# Patient Record
Sex: Female | Born: 1965 | Race: White | Hispanic: No | Marital: Married | Smoking: Never smoker
Health system: Southern US, Community
[De-identification: ages and names within clinical notes are randomized; demographics above are authoritative.]

## PROBLEM LIST (undated history)

## (undated) DIAGNOSIS — T7840XA Allergy, unspecified, initial encounter: Secondary | ICD-10-CM

## (undated) DIAGNOSIS — R011 Cardiac murmur, unspecified: Secondary | ICD-10-CM

## (undated) DIAGNOSIS — I10 Essential (primary) hypertension: Secondary | ICD-10-CM

## (undated) DIAGNOSIS — B029 Zoster without complications: Secondary | ICD-10-CM

## (undated) DIAGNOSIS — R21 Rash and other nonspecific skin eruption: Secondary | ICD-10-CM

## (undated) DIAGNOSIS — F329 Major depressive disorder, single episode, unspecified: Secondary | ICD-10-CM

## (undated) DIAGNOSIS — D649 Anemia, unspecified: Secondary | ICD-10-CM

## (undated) DIAGNOSIS — M722 Plantar fascial fibromatosis: Secondary | ICD-10-CM

## (undated) DIAGNOSIS — F32A Depression, unspecified: Secondary | ICD-10-CM

## (undated) DIAGNOSIS — Z8619 Personal history of other infectious and parasitic diseases: Secondary | ICD-10-CM

## (undated) HISTORY — DX: Essential (primary) hypertension: I10

## (undated) HISTORY — DX: Zoster without complications: B02.9

## (undated) HISTORY — DX: Personal history of other infectious and parasitic diseases: Z86.19

## (undated) HISTORY — DX: Rash and other nonspecific skin eruption: R21

## (undated) HISTORY — DX: Cardiac murmur, unspecified: R01.1

## (undated) HISTORY — DX: Plantar fascial fibromatosis: M72.2

## (undated) HISTORY — DX: Major depressive disorder, single episode, unspecified: F32.9

## (undated) HISTORY — PX: CERVIX REMOVAL: SHX592

## (undated) HISTORY — PX: KNEE SURGERY: SHX244

## (undated) HISTORY — DX: Anemia, unspecified: D64.9

## (undated) HISTORY — DX: Depression, unspecified: F32.A

## (undated) HISTORY — DX: Allergy, unspecified, initial encounter: T78.40XA

## (undated) HISTORY — PX: FOOT SURGERY: SHX648

---

## 2003-05-21 HISTORY — PX: ABDOMINAL HYSTERECTOMY: SHX81

## 2003-08-19 ENCOUNTER — Encounter: Admission: RE | Admit: 2003-08-19 | Discharge: 2003-08-19 | Payer: Self-pay | Admitting: Unknown Physician Specialty

## 2005-07-17 ENCOUNTER — Encounter (INDEPENDENT_AMBULATORY_CARE_PROVIDER_SITE_OTHER): Payer: Self-pay | Admitting: Specialist

## 2005-07-17 ENCOUNTER — Ambulatory Visit (HOSPITAL_COMMUNITY): Admission: RE | Admit: 2005-07-17 | Discharge: 2005-07-17 | Payer: Self-pay | Admitting: Obstetrics and Gynecology

## 2005-10-09 ENCOUNTER — Encounter (INDEPENDENT_AMBULATORY_CARE_PROVIDER_SITE_OTHER): Payer: Self-pay | Admitting: *Deleted

## 2005-10-09 ENCOUNTER — Inpatient Hospital Stay (HOSPITAL_COMMUNITY): Admission: RE | Admit: 2005-10-09 | Discharge: 2005-10-10 | Payer: Self-pay | Admitting: Obstetrics and Gynecology

## 2007-12-25 ENCOUNTER — Ambulatory Visit: Payer: Self-pay | Admitting: Internal Medicine

## 2007-12-29 ENCOUNTER — Encounter: Payer: Self-pay | Admitting: Internal Medicine

## 2007-12-29 ENCOUNTER — Ambulatory Visit: Payer: Self-pay | Admitting: Cardiovascular Disease

## 2007-12-29 LAB — CONVERTED CEMR LAB
ALT: 30 units/L (ref 0–35)
AST: 28 units/L (ref 0–37)
Albumin: 3.6 g/dL (ref 3.5–5.2)
CO2: 21 meq/L (ref 19–32)
Cholesterol: 159 mg/dL (ref 0–200)
Creatinine, Ser: 0.55 mg/dL (ref 0.40–1.20)
LDL Cholesterol: 104 mg/dL — ABNORMAL HIGH (ref 0–99)
Sodium: 142 meq/L (ref 135–145)
Triglycerides: 95 mg/dL (ref <150)

## 2008-01-13 ENCOUNTER — Encounter: Payer: Self-pay | Admitting: Internal Medicine

## 2008-01-13 ENCOUNTER — Ambulatory Visit: Payer: Self-pay

## 2010-10-02 NOTE — Assessment & Plan Note (Signed)
[facility] OFFICE NOTE   JAIMEY, FRANCHINI                      MRN:          [id number]  DATE:12/25/2007                            DOB:          [birth year]    PRIMARY CARE PHYSICIAN:  Vonita Moss, MD   Smt. is a delightful 45 year old woman, who is the wife of Honest Safranek, a friend of mine.  She presents today for further evaluation of  a murmur.   She had a history of severe rheumatic fever as a child and was told as a  child that she had a murmur.  She has never had any other heart  problems.  About 2 years ago, she had a hysterectomy, and the  anesthesiologist told her she had a murmur, so she wanted to get this  checked up upon.   Overall, she is fairly active.  She walks couple times a week, but not  as much as she likes, but walks very fast without any chest pain.  She  does notice about twice a year with emotional stress, she gets some  chest tightness.  She recently hiked the [employer] with a 50-  pound pack, and aside from feeling tired, did not have any symptoms.  She does have chronic lower extremity edema due to her severe varicose  veins.   Remainder of review of systems is notable for constipation and fatigue  as well as menstrual dysfunction.  Rest of review of systems are  negative except for the HPI and problem list.   PROBLEM LIST:  1. History of heart murmur.  2. Varicose veins with associated lower extremity edema.   MEDICATIONS:  None.   ALLERGIES:  None.   SOCIAL HISTORY:  She is married with 2 kids.  She is a Runner, broadcasting/film/video.  She  does not smoke.  She does have occasional alcoholic drink.   FAMILY HISTORY:  Family is very healthy, 3 of her 4 grandparents are  still alive and well into their 28s.  There is no family history of  premature coronary artery disease.   PHYSICAL EXAMINATION:  GENERAL:  She is well appearing, in no acute  distress, ambulatory around the  clinic, without any respiratory  difficulty.  VITAL SIGNS:  Blood pressure initially 118/80, followup 134/90; heart  rate 87, and weight is 200.  HEENT:  Normal.  Her tympanic membranes are clear.  Her oropharynx is  also clear.  NECK:  Supple.  There is no JVD.  Carotids are 2+ bilaterally without  bruits.  She does have a little tender lymph node on the left side under  the ear.  There is no thyromegaly.  CARDIAC:  PMI is nondisplaced.  She is regular with a very soft systolic  ejection murmur at the right sternal border.  This does not change  significantly with inspiration or Valsalva.  S2 is crisp.  LUNGS:  Clear.  ABDOMEN:  Obese, nontender, and nondistended.  No hepatosplenomegaly.  No bruits.  No masses.  ABDOMEN:  Good bowel sounds.  EXTREMITIES:  Warm with no cyanosis or clubbing.  She does have  significant varicose veins with some just trace ankle edema.  No rash.  NEURO:  Alert and oriented x3.  Cranial nerves II through XII are  intact.  Moves all 4 extremities without difficulty.  Affect is  pleasant.   EKG shows sinus rhythm at a rate 87 with no ST-T wave abnormalities.   ASSESSMENT AND PLAN:  1. Murmur.  I think this is benign functional murmur.  We will get an      echocardiogram to further evaluate.  I have reassured her.  2. Chest pain.  This is a very occasional.  She is very active without      any exertional symptoms and no family history of premature coronary      artery disease.  We did discuss the possibility of stress test.  I      told her I think we can just continue to follow.   Lipids.  We will check her cholesterol and CRP when she follows up for  echocardiogram and see where they are at.   DISPOSITION:  She can return on a p.r.n. basis.     Bevelyn Buckles. Bensimhon, MD  Electronically Signed    DRB/MedQ  DD: 12/25/2007  DT: 12/26/2007  Job #: [id number]

## 2010-10-05 NOTE — Op Note (Signed)
NAMEBRINLEY, ROSETE             ACCOUNT NO.:  [id number]   MEDICAL RECORD NO.:  [id number]          PATIENT TYPE:  INP   LOCATION:  9307                          [facility]ACILITY:  [facility]   PHYSICIAN:  Zelphia Cairo, MD    DATE OF BIRTH:  13-Oct-1965   DATE OF PROCEDURE:  10/09/2005  DATE OF DISCHARGE:                                 OPERATIVE REPORT   PREOPERATIVE DIAGNOSES:  1.  Endometriosis.  2.  Right lower quadrant pain.  3.  Cystocele.   POSTOPERATIVE DIAGNOSES:  1.  Endometriosis.  2.  Right lower quadrant pain.   PROCEDURE:  LAVH/RSO with anterior repair.   SURGEON:  Zelphia Cairo, MD.   ASSISTANT:  Juluis Mire, M.D.   ANESTHESIA:  General.   SPECIMEN:  Uterine cervix, right tube and ovary.   ESTIMATED BLOOD LOSS:  400 mL.   COMPLICATIONS:  None.   CONDITION:  Stable and extubated to the recovery room.   PROCEDURE:  The patient was taken to the operating room where general  anesthesia was obtained. She was placed in the dorsal lithotomy position  using Allen stirrups.  She was prepped and draped in sterile fashion and a  Foley catheter was inserted.  A speculum was placed in the vagina and a  single-toothed tenaculum was used to grasp the anterior lip of the cervix.  A Hulka uterine manipulator was placed on the cervix.  The single-tooth  tenaculum and the speculum were then removed.   Our attention was then turned to the abdomen where a infraumbilical skin  incision was made with the scalpel and the Veress needle was inserted  intraperitoneally.  This was confirmed with the saline drop test and abdomen  and pelvis were insufflated with CO2.  The Veress needle was then removed  and an blunt 11 mm trocar was inserted through the incision under direct  visualization with the straight scope.  Next a small suprapubic incision was  made with the scalpel and a 5 mm trocar was placed under direct  visualization.  A blunt probe was used to move any bowel out of the  posterior cul-de-sac.  The patient was placed in Trendelenburg.  A blunt  grasper was used to grasp the right adnexa which was tented upwards.  The  gyrus was used to coagulate and cut the infundibulopelvic ligament.  Excellent hemostasis was noted.  Serial bites with the gyrus were taken  along the broad ligament adjacent to the right fallopian tube and uterus to  the level of the round ligament.  Once hemostasis was ensured, our attention  was turned to the left side.  The left adnexa was tented upward and the left  utero-ovarian ligament was coagulated and cut with the gyrus.  This was  continued down the broad ligament adjacent to the uterus to the level of the  round ligament.  Hemostasis was ensured.  The gas and equipment were turned  off and our attention was then turned to the vagina.   The Hulka clamp was removed from the cervix and the cervix was grasped with  a double tooth tenaculum.  The posterior cul-de-sac was entered sharply  using Mayo scissors.  A circumferential incision was made around the cervix  using the Bovie.  The uterosacral ligaments were grasped with the Ligasure  and cauterized bilaterally and cut with Mayo scissors.  Next, the anterior  cul-de-sac was entered sharply using Mayo scissors.  The Deaver retractor  was placed in the anterior cul-de-sac. The uterine arteries and cardinal  ligaments were cauterized bilaterally using the Ligasure and cut with Mayo  scissors.  The uterine fundus was then grasped with a thyroid tenaculum and  delivered through the posterior cul-de-sac.  The remaining attachments of  the broad ligaments were clamped bilaterally with curved Heaney clamps.  The  pedicles were cut with Mayo scissors and the specimen was handed off to the  nursing staff.  A free tie was used to tie off the remaining pedicles.  Heaney clamps were then removed.  A moist lap sponge was then placed into  the posterior cul-de-sac to retract the bowel.  The  posterior cuff was  closed with a running locked 2-0 Vicryl stitch.   Next, two Allis clamps were placed at the edge of the vaginal cuff and  another Allis clamp was placed approximately 1 cm below the urethral meatus.  Metzenbaum scissors were used to dissect the vaginal mucosa from the  underlying cervicopubic fascia.  This was dissected bluntly and using the  Metzenbaum scissors.  Next the cervical pubic fascia was reapproximated  using 2-0 Vicryl figure-of-eight stitches.  Excess vaginal mucosa was  trimmed using Metzenbaum scissors and the vaginal mucosa was reapproximated  using #0 Vicryl figure-of-eight sutures in an interrupted fashion.  The  vaginal cuff was then closed with #0 Vicryl and hemostasis was noted.   Our attention returned to the abdomen where the abdomen and pelvis were  reinsufflated with CO2.  The camera was used to ensure hemostasis.  All  pedicles were visualized and found to be hemostatic.  There were no residual  endometriosis noted.  All instruments were then removed from the abdomen and  the suprapubic incision was closed with 3-0 Vicryl with a subcutaneous  stitch.  The fascia of the infraumbilical incision was closed with #0 Vicryl  and the skin was closed with 3-0 Vicryl.  The patient tolerated the  procedure well.  Sponge, lap, needle and instrument counts were correct x2.  She was extubated and taken to the recovery room in stable condition.      Zelphia Cairo, MD  Electronically Signed     GA/MEDQ  D:  10/09/2005  T:  10/09/2005  Job:  [id number]

## 2010-10-05 NOTE — H&P (Signed)
[name], [name]             ACCOUNT NO.:  [id number]   MEDICAL RECORD NO.:  [id number]          PATIENT TYPE:  AMB   LOCATION:  SDC                           [facility]ILITY:  [facility]   PHYSICIAN:  Zelphia Cairo, MD    DATE OF BIRTH:  1966/02/17   DATE OF ADMISSION:  07/17/2005  DATE OF DISCHARGE:  07/03/2005                                HISTORY & PHYSICAL   A 45 year old white female, who presents complaining of menorrhagia.  She  reports that she has monthly irregular periods every 2-1/2 to 3-1/2 weeks  lasting 5-7 days.  They are very heavy associated with blood clots and she  has been on iron in the past to prevent severe anemia.  She also complains  of fatigue.  The patient also reports groin pain over the last year and had  an ultrasound done December of 2006, which showed a small right ovarian cyst  with fluid in the cul-de-sac.  The patient also complains of painful bowel  movements.   PAST MEDICAL HISTORY:  Negative.   PAST SURGICAL HISTORY:  Negative.   ALLERGIES:  NONE.   MEDICATIONS:  Iron, baby aspirin and multivitamin.   GYNECOLOGIC HISTORY:  As above.  Her last Pap smear was normal.   OBSTETRIC HISTORY:  Significant for 2 full-term spontaneously vaginal  deliveries without complications.   PHYSICAL EXAMINATION:  VITAL SIGNS:  Height 5 feet 8 inches, weight 210,  blood pressure 112/64 and hemoglobin 10.  HEART:  Regular rate and rhythm.  LUNGS:  Clear bilaterally.  ABDOMEN:  Soft and nontender.  PELVIC EXAM:  Normal external female genitalia.  The vagina and cervix  appear normal without lesion.  The uterus is small, retroverted and  nontender.  No palpable masses.   A transvaginal ultrasound was repeated today, which showed a resolution of  the right ovarian cyst.  The left ovary has small follicular cysts.  There  is a moderate amount of fluid in the cul-de-sac.  Endometrial stripe is  thickened at 17 mm.   ASSESSMENT AND PLAN:  A 44 year old white  female.  1.  Menorrhagia.  Given that she has a thickened endometrial stripe on      ultrasound, we will perform a hysteroscopy and D&C today.  2.  Pelvic pain.  The patient continues to want a diagnostic laparoscopy      given resolution of the right ovarian cyst.  We will evaluate for any      other potential sources of pain such as adhesive disease or      endometriosis.  Risks, benefits, and alternatives were explained with      the patient and informed consent was obtained.      Zelphia Cairo, MD  Electronically Signed     GA/MEDQ  D:  07/15/2005  T:  07/15/2005  Job:  [id number]

## 2010-10-05 NOTE — H&P (Signed)
[name], [name]             ACCOUNT NO.:  [id number]   MEDICAL RECORD NO.:  [id number]          PATIENT TYPE:  AMB   LOCATION:  SDC                           [facility]   PHYSICIAN:  Zelphia Cairo, MD    DATE OF BIRTH:  06/02/1965   DATE OF ADMISSION:  DATE OF DISCHARGE:                                HISTORY & PHYSICAL   A 45 year old white female, who presents today for an LAVH with RSO.  The  patient underwent a hysteroscopy D&C with pathology showing benign secretory  endometrium on July 17, 2005.  At that same time, she had a diagnostic  laparoscopy which showed evidence of endometriosis.  Due to the patient's  continued pain, the patient elects to proceed with a hysterectomy and RSO.   PAST MEDICAL HISTORY:  Negative.   PAST SURGICAL HISTORY:  1.  Arm skin lesion removed.  2.  Hysteroscopy D&C.  3.  Diagnostic laparoscopy.   ALLERGIES:  None.   MEDICATIONS:  Iron, baby aspirin, multiple vitamin.   GYN HISTORY:  Significant for a history of abnormal Pap smear 16 years ago,  however, they have been normal since then.  Her last Pap smear in December  2006 was normal.   OB HISTORY:  Significant for 2 full-term spontaneous vaginal deliveries  without complications.   Height 5 feet 8 inches, weight 210, blood pressure 112/64, hemoglobin 10,  urinalysis negative.  Head and neck exam are normal.  Heart is regular rate  and rhythm.  Lungs are clear bilaterally.  Abdomen is soft, nontender,  nondistended.  Pelvic exam reveals normal external female genitalia.  Vagina  appears normal without lesions.  There is evidence of a small cystocele  anteriorly.  Posterior support seems adequate.  Cervix is normal.  Uterus is  small, mobile, retroverted, and nontender.  No adnexal masses are palpated.   ASSESSMENT/PLAN:  A 45 year old white female with pelvic pain and  endometriosis scheduled for laparoscopically-assisted vaginal hysterectomy,  right  salpingo-oophorectomy.  Risks, benefits, and alternatives were  discussed with the patient, and informed consent was obtained.  We also  discussed an anterior repair for anterior vaginal prolapse.  The patient  elected to proceed.      Zelphia Cairo, MD  Electronically Signed    GA/MEDQ  D:  10/08/2005  T:  10/08/2005  Job:  [id number]

## 2010-10-05 NOTE — Op Note (Signed)
NAMEGIADA, [name]             ACCOUNT NO.:  [id number]   MEDICAL RECORD NO.:  [id number]          PATIENT TYPE:  AMB   LOCATION:  SDC                           [facility]   PHYSICIAN:  Zelphia Cairo, MD    DATE OF BIRTH:  04-10-1966   DATE OF PROCEDURE:  07/17/2005  DATE OF DISCHARGE:                                 OPERATIVE REPORT   PREOPERATIVE DIAGNOSES:  1.  Menorrhagia.  2.  Right lower quadrant pain.   POSTOPERATIVE DIAGNOSES:  1.  Menorrhagia.  2.  Right lower quadrant pain.  3.  Suspect endometriosis, pathology pending.   OPERATION/PROCEDURE:  1.  Hysterectomy and dilatation and curettage.  2.  Diagnostic laparoscopy with biopsy of peritoneum.   SURGEON:  Zelphia Cairo, M.D.   ANESTHESIA:  General.   SPECIMENS:  1.  Endometrial curettings.  2.  Peritoneal biopsy.   ESTIMATED BLOOD LOSS:  Minimal.   COMPLICATIONS:  None.   CONDITION:  The patient stable and extubated to the recovery room.   DESCRIPTION OF PROCEDURE:  The patient was taken to the operating room where  general anesthesia was obtained.  She was placed in the dorsal lithotomy  position using Allen stirrups.  She was prepped and draped in the sterile  fashion.  A Graves speculum was placed in the vagina and a single-tooth  tenaculum was placed on the anterior lip of the cervix.  The uterus sounded  to 10 cm.  The hysteroscope was inserted into the endometrial cavity.  The  endometrium was noted to be very fluffy with a few small polyps on the  anterior uterus.  Hysteroscope was removed and endometrial curetting was  performed.  This was placed on Telfa and sent off the pathology.  Marcaine  0.25% was used for a cervical block and a uterine manipulator was placed in  the uterus.  Single-tooth tenaculum and speculum were removed from the  vagina and our attention was then turned to the abdomen.  A small  infraumbilical skin incision was made with scalpel and the Veress needle was  inserted.  The abdomen and pelvis were then insufflated with CO2.  The  Veress needle was removed and an 11 mm trocar was inserted without  difficulty.  The laparoscope was then inserted through the 11 mm trocar and  a survey of the abdomen and pelvis was performed. A small suprapubic skin  incision was made with the scalpel and a 5 mm trocar was inserted under  direct visualization.  A blunt probe was placed through this trocar to aid  in our survey.  Liver and appendix all appeared within normal limits.  The  uterus appeared normal.  There were a small endometrioma-appearing cyst on  the right ovary.  There were multiple cystic areas with powder burns along  the right uterosacral ligament, the right cardinal ligament and the left  ovarian fossa.  There was a moderate amount of fluid in the cul-de-sac.  Fluid was aspirated and sent to cytology.  Due to the extensive nature of  what appeared to be endometriosis throughout the pelvic cavity, we did  not  elect to remove or cauterize because of its location and concern for  ureteral injury.  I also felt the depth of the endometriosis would be too  much to remove at the time of the surgery safely.  For this reason, a biopsy  forceps was inserted through the operative port and peritoneal biopsy was  performed to confirm the diagnosis of endometriosis.  Biopsy forceps were  used to perform a biopsy of the peritoneum.   All trocars and instruments were then removed from the abdomen.  CO2 gas was  released.  The fascia at the infraumbilical skin incision was closed with  Vicryl and the skin was closed with 3-0 Vicryl with interrupted sutures.  The uterine manipulator was removed from the vagina.  The patient was  extubated and taken to the recovery room in stable condition.      Zelphia Cairo, MD  Electronically Signed     GA/MEDQ  D:  07/17/2005  T:  07/17/2005  Job:  [id number]

## 2013-05-22 ENCOUNTER — Ambulatory Visit: Payer: Self-pay | Admitting: Specialist

## 2013-06-02 ENCOUNTER — Ambulatory Visit: Payer: Self-pay | Admitting: Family Medicine

## 2013-06-18 ENCOUNTER — Ambulatory Visit: Payer: Self-pay | Admitting: Family Medicine

## 2013-06-21 LAB — HM MAMMOGRAPHY: HM Mammogram: NORMAL

## 2013-06-24 ENCOUNTER — Ambulatory Visit: Payer: Self-pay | Admitting: Specialist

## 2013-06-24 LAB — POTASSIUM: Potassium: 3.6 mmol/L (ref 3.5–5.1)

## 2013-07-02 ENCOUNTER — Ambulatory Visit: Payer: Self-pay | Admitting: Specialist

## 2013-12-29 ENCOUNTER — Encounter: Payer: Self-pay | Admitting: Adult Health

## 2013-12-29 ENCOUNTER — Telehealth: Payer: Self-pay | Admitting: Adult Health

## 2013-12-29 ENCOUNTER — Encounter (INDEPENDENT_AMBULATORY_CARE_PROVIDER_SITE_OTHER): Payer: Self-pay

## 2013-12-29 ENCOUNTER — Ambulatory Visit (INDEPENDENT_AMBULATORY_CARE_PROVIDER_SITE_OTHER): Payer: BC Managed Care – PPO | Admitting: Adult Health

## 2013-12-29 VITALS — BP 145/90 | HR 79 | Temp 98.7°F | Resp 14 | Ht 67.5 in | Wt 211.5 lb

## 2013-12-29 DIAGNOSIS — M722 Plantar fascial fibromatosis: Secondary | ICD-10-CM | POA: Insufficient documentation

## 2013-12-29 DIAGNOSIS — E669 Obesity, unspecified: Secondary | ICD-10-CM | POA: Insufficient documentation

## 2013-12-29 DIAGNOSIS — I10 Essential (primary) hypertension: Secondary | ICD-10-CM

## 2013-12-29 DIAGNOSIS — D509 Iron deficiency anemia, unspecified: Secondary | ICD-10-CM

## 2013-12-29 DIAGNOSIS — E663 Overweight: Secondary | ICD-10-CM

## 2013-12-29 MED ORDER — LORCASERIN HCL 10 MG PO TABS: ORAL_TABLET | ORAL | Status: DC

## 2013-12-29 MED ORDER — LISINOPRIL 10 MG PO TABS: 10.0000 mg | ORAL_TABLET | Freq: Every day | ORAL | Status: DC

## 2013-12-29 NOTE — Progress Notes (Signed)
Patient ID: [name], female   DOB: 1965/06/09, 48 y.o.   MRN: [id number]    Subjective:    Patient ID: Erica Lassebecca A Kestler, female    DOB: 1965/06/09, 48 y.o.   MRN: [id number]  HPI Pt is a pleasant 48 y/o female who presents to clinic to establish care. Previously followed by Dr. Dossie Arbourrissman. We'll request records. She has a history of high blood pressure and currently on HCTZ 25 mg daily. Blood pressure has not been well controlled. Systolic blood pressure readings have been in the 140s and diastolic have been in the high 80s to 90s.  Patient has history of left torn meniscus status post repair in February 2015. She spent approximately one year prior to repair. Subsequent to her injury, she stopped exercising. She has gained considerable amount of weight. She reports eating a very healthy diet. Diet consists of lead protein, fruits, vegetables and healthy fats such as nuts and avocados, olive oil. She describes her greatest problem is portion size. She is requesting assistance with an appetite suppressant to help her with portion control. She has tried phentermine in the past with good results; however, with her blood pressure being elevated this cannot currently be used.  Plantar Fasciitis of both feet. Worse pain is experienced first thing in the morning upon awakening and when she gets up after resting for a while such as riding in a car. She takes ibuprofen as needed.  History of anemia. She is status post hysterectomy and reports still having issues with anemia although they have told her that it is very mild. Uncertain as to what type of anemia but suspect it is iron deficiency.   Past Medical History  Diagnosis Date  . Depression   . Heart murmur   . Hypertension   . Anemia   . History of genital warts   . History of chicken pox   . H/O scarlet fever      Past Surgical History  Procedure Laterality Date  . Abdominal hysterectomy  2005  . Knee surgery Left     2015      Family History  Problem Relation Age of Onset  . Hyperlipidemia Mother   . Hypertension Mother   . Stroke Maternal Grandmother      History   Social History  . Marital Status: Married    Spouse Name: N/A    Number of Children: 2  . Years of Education: 18   Occupational History  . High School Teacher    Social History Main Topics  . Smoking status: Never Smoker   . Smokeless tobacco: Not on file  . Alcohol Use: 3.6 oz/week    6 Glasses of wine per week  . Drug Use: No  . Sexual Activity: Not on file   Other Topics Concern  . Not on file   Social History Narrative  . No narrative on file    Review of Systems  Constitutional: Negative.  Negative for fever, activity change, appetite change and fatigue.       Has started exercising after repair of meniscal tear on left. Concerned about her weight gain post meniscal injury  HENT: Negative.   Eyes: Negative.   Respiratory: Negative.   Cardiovascular: Negative for chest pain, palpitations and leg swelling.       Reports hx of murmur following scarlet fever as a child  Gastrointestinal: Negative.   Endocrine: Negative.   Genitourinary: Negative.   Musculoskeletal:       Doing  very well following meniscal repair. Plantar fasciitis  Skin: Negative.   Allergic/Immunologic: Negative.   Neurological: Negative.   Hematological: Negative.   Psychiatric/Behavioral: Negative.        Objective:  BP 145/90  Pulse 79  Temp(Src) 98.7 F (37.1 C) (Oral)  Resp 14  Ht 5' 7.5" (1.715 m)  Wt 211 lb 8 oz (95.936 kg)  BMI 32.62 kg/m2  SpO2 99%   Physical Exam  Constitutional: She is oriented to person, place, and time. No distress.  Overweight, pleasant  HENT:  Head: Normocephalic and atraumatic.  Eyes: Conjunctivae and EOM are normal.  Neck: Normal range of motion. Neck supple.  Cardiovascular: Normal rate, regular rhythm, normal heart sounds and intact distal pulses.  Exam reveals no gallop and no friction rub.    No murmur heard. Murmur not audible  Pulmonary/Chest: Effort normal and breath sounds normal. No respiratory distress. She has no wheezes. She has no rales.  Musculoskeletal: Normal range of motion.  Neurological: She is alert and oriented to person, place, and time. She has normal reflexes. Coordination normal.  Skin: Skin is warm and dry.  Psychiatric: She has a normal mood and affect. Her behavior is normal. Judgment and thought content normal.      Assessment & Plan:   1. Iron deficiency anemia Check labs. Follow - CBC with Differential  2. Essential hypertension Add lisinopril 10 mg. Continue HCTZ. Check labs. Follow up in 1 month - Basic metabolic panel  3. Plantar fasciitis Frozen water bottle or tennis ball roll on feet Massage feet, stretching  Weight loss will help symptoms If symptoms do not improve refer to podiatry  4. Overweight Pt eats healthy diet. Portion control is her biggest problem. Cannot take phentermine given elevated b/p. Start belviq 10 mg bid. Discussed need to decrease 5% of body weight within 12 weeks to continue medication.

## 2013-12-29 NOTE — Patient Instructions (Signed)
   [facility].  Please have your labs drawn prior to leaving the office.  The results will be available through MyChart for your convenience. Please remember to activate this. The activation code is located at the end of this form.  I have sent in a prescription for lisinopril 10 mg daily. This is for your blood pressure. I need you to return in one week for recheck of kidney and potassium level.  Continue your HCTZ for blood pressure as well. Monitor your blood pressure on the new medication and record. I would like to see you for follow up in 1 month.  I am starting you on Belviq for appetite control. Take 10 mg twice a day.  Increase physical activity. Watch portions. Continue your healthy eating.  For Plantar Fasciitis continue the frozen water bottle, tennis ball, stretches. Decreasing weight will also help. Worse case scenario, if pain persists we can refer you to Podiatry.

## 2013-12-29 NOTE — Telephone Encounter (Signed)
PA started online for [employer]

## 2013-12-29 NOTE — Progress Notes (Signed)
Pre visit review using our clinic review tool, if applicable. No additional management support is needed unless otherwise documented below in the visit note. 

## 2013-12-29 NOTE — Telephone Encounter (Signed)
Relevant patient education assigned to patient using Emmi. ° °

## 2013-12-30 ENCOUNTER — Encounter: Payer: Self-pay | Admitting: Adult Health

## 2013-12-30 LAB — CBC WITH DIFFERENTIAL/PLATELET
BASOS ABS: 0 10*3/uL (ref 0.0–0.1)
BASOS PCT: 0.3 % (ref 0.0–3.0)
EOS ABS: 0.1 10*3/uL (ref 0.0–0.7)
Eosinophils Relative: 0.9 % (ref 0.0–5.0)
HCT: 34.2 % — ABNORMAL LOW (ref 36.0–46.0)
HEMOGLOBIN: 10.7 g/dL — AB (ref 12.0–15.0)
LYMPHS ABS: 2.6 10*3/uL (ref 0.7–4.0)
Lymphocytes Relative: 24.1 % (ref 12.0–46.0)
MCHC: 31.2 g/dL (ref 30.0–36.0)
MCV: 74.6 fl — ABNORMAL LOW (ref 78.0–100.0)
Monocytes Absolute: 0.8 10*3/uL (ref 0.1–1.0)
Monocytes Relative: 7.7 % (ref 3.0–12.0)
NEUTROS ABS: 7.1 10*3/uL (ref 1.4–7.7)
Neutrophils Relative %: 67 % (ref 43.0–77.0)
PLATELETS: 340 10*3/uL (ref 150.0–400.0)
RBC: 4.58 Mil/uL (ref 3.87–5.11)
RDW: 16.7 % — AB (ref 11.5–15.5)
WBC: 10.7 10*3/uL — AB (ref 4.0–10.5)

## 2013-12-30 LAB — BASIC METABOLIC PANEL
BUN: 9 mg/dL (ref 6–23)
CALCIUM: 8.5 mg/dL (ref 8.4–10.5)
CHLORIDE: 105 meq/L (ref 96–112)
CO2: 25 meq/L (ref 19–32)
Creatinine, Ser: 0.6 mg/dL (ref 0.4–1.2)
GFR: 104.72 mL/min (ref >60.00)
GLUCOSE: 81 mg/dL (ref 70–99)
Potassium: 4 mEq/L (ref 3.5–5.1)
SODIUM: 136 meq/L (ref 135–145)

## 2013-12-30 LAB — IRON: Iron: 25 ug/dL — ABNORMAL LOW (ref 42–145)

## 2013-12-30 LAB — FERRITIN: Ferritin: 5.2 ng/mL — ABNORMAL LOW (ref 10.0–291.0)

## 2013-12-30 NOTE — Addendum Note (Signed)
Addended by: Dennie BibleAVIS, Jackson Coffield R on: 12/30/2013 01:53 PM   Modules accepted: Orders

## 2014-01-04 ENCOUNTER — Telehealth: Payer: Self-pay | Admitting: *Deleted

## 2014-01-04 ENCOUNTER — Other Ambulatory Visit: Payer: Self-pay | Admitting: Adult Health

## 2014-01-04 DIAGNOSIS — R899 Unspecified abnormal finding in specimens from other organs, systems and tissues: Secondary | ICD-10-CM

## 2014-01-04 NOTE — Telephone Encounter (Signed)
Pt is coming in tomorrow what labs and dx?  

## 2014-01-05 ENCOUNTER — Other Ambulatory Visit (INDEPENDENT_AMBULATORY_CARE_PROVIDER_SITE_OTHER): Payer: BC Managed Care – PPO

## 2014-01-05 DIAGNOSIS — R899 Unspecified abnormal finding in specimens from other organs, systems and tissues: Secondary | ICD-10-CM

## 2014-01-05 DIAGNOSIS — R6889 Other general symptoms and signs: Secondary | ICD-10-CM

## 2014-01-06 LAB — CBC WITH DIFFERENTIAL/PLATELET
Basophils Absolute: 0.1 10*3/uL (ref 0.0–0.1)
Basophils Relative: 0.6 % (ref 0.0–3.0)
EOS PCT: 1 % (ref 0.0–5.0)
Eosinophils Absolute: 0.1 10*3/uL (ref 0.0–0.7)
HEMATOCRIT: 34.3 % — AB (ref 36.0–46.0)
HEMOGLOBIN: 10.7 g/dL — AB (ref 12.0–15.0)
Lymphocytes Relative: 30.6 % (ref 12.0–46.0)
Lymphs Abs: 2.8 10*3/uL (ref 0.7–4.0)
MCHC: 31.2 g/dL (ref 30.0–36.0)
MCV: 75.1 fl — AB (ref 78.0–100.0)
MONOS PCT: 8.3 % (ref 3.0–12.0)
Monocytes Absolute: 0.7 10*3/uL (ref 0.1–1.0)
NEUTROS ABS: 5.3 10*3/uL (ref 1.4–7.7)
Neutrophils Relative %: 59.5 % (ref 43.0–77.0)
PLATELETS: 315 10*3/uL (ref 150.0–400.0)
RBC: 4.57 Mil/uL (ref 3.87–5.11)
RDW: 17.3 % — AB (ref 11.5–15.5)
WBC: 9 10*3/uL (ref 4.0–10.5)

## 2014-01-07 ENCOUNTER — Encounter: Payer: Self-pay | Admitting: Adult Health

## 2014-01-10 ENCOUNTER — Encounter: Payer: Self-pay | Admitting: Adult Health

## 2014-03-10 ENCOUNTER — Ambulatory Visit: Payer: BC Managed Care – PPO | Admitting: Internal Medicine

## 2014-03-11 ENCOUNTER — Ambulatory Visit: Payer: BC Managed Care – PPO | Admitting: Internal Medicine

## 2014-04-06 ENCOUNTER — Other Ambulatory Visit: Payer: Self-pay | Admitting: *Deleted

## 2014-04-06 MED ORDER — HYDROCHLOROTHIAZIDE 25 MG PO TABS: 25.0000 mg | ORAL_TABLET | Freq: Every day | ORAL | Status: DC

## 2014-04-20 ENCOUNTER — Other Ambulatory Visit: Payer: Self-pay | Admitting: *Deleted

## 2014-04-20 MED ORDER — LISINOPRIL 10 MG PO TABS: 10.0000 mg | ORAL_TABLET | Freq: Every day | ORAL | Status: DC

## 2014-04-27 ENCOUNTER — Ambulatory Visit (INDEPENDENT_AMBULATORY_CARE_PROVIDER_SITE_OTHER): Payer: BC Managed Care – PPO | Admitting: Internal Medicine

## 2014-04-27 ENCOUNTER — Encounter: Payer: Self-pay | Admitting: Internal Medicine

## 2014-04-27 VITALS — BP 122/84 | HR 65 | Temp 97.9°F | Resp 16 | Ht 67.5 in | Wt 202.5 lb

## 2014-04-27 DIAGNOSIS — E663 Overweight: Secondary | ICD-10-CM

## 2014-04-27 MED ORDER — PHENTERMINE HCL 37.5 MG PO TABS: ORAL_TABLET | ORAL | Status: DC

## 2014-04-27 NOTE — Progress Notes (Signed)
Patient ID: [name], female   DOB: 01-02-1966, 48 y.o.   MRN: [id number]   Patient Active Problem List   Diagnosis Date Noted  . Iron deficiency anemia 12/29/2013  . Essential hypertension 12/29/2013  . Plantar fasciitis 12/29/2013  . Overweight 12/29/2013    Subjective:  CC:   Chief Complaint  Patient presents with  . Follow-up    Ran out of Belviq X 2 days ago and thinks insurance will not cover anymore.    HPI:   [name] is a 48 y.o. female who presents for management of overweight with medication.  She tolerated belviq and lost 9 lbs.  Her insurance has decided not to pay for belviq any more.  She is not exercising yet.   Past Medical History  Diagnosis Date  . Depression   . Heart murmur   . Hypertension   . Anemia   . History of genital warts   . History of chicken pox   . H/O scarlet fever     Past Surgical History  Procedure Laterality Date  . Abdominal hysterectomy  2005  . Knee surgery Left     2015       The following portions of the patient's history were reviewed and updated as appropriate: Allergies, current medications, and problem list.    Review of Systems:   Patient denies headache, fevers, malaise, unintentional weight loss, skin rash, eye pain, sinus congestion and sinus pain, sore throat, dysphagia,  hemoptysis , cough, dyspnea, wheezing, chest pain, palpitations, orthopnea, edema, abdominal pain, nausea, melena, diarrhea, constipation, flank pain, dysuria, hematuria, urinary  Frequency, nocturia, numbness, tingling, seizures,  Focal weakness, Loss of consciousness,  Tremor, insomnia, depression, anxiety, and suicidal ideation.     History   Social History  . Marital Status: Married    Spouse Name: N/A    Number of Children: 2  . Years of Education: 18   Occupational History  . High School Teacher    Social History Main Topics  . Smoking status: Never Smoker   . Smokeless tobacco: Not on file  . Alcohol Use:  3.6 oz/week    6 Glasses of wine per week  . Drug Use: No  . Sexual Activity: Not on file   Other Topics Concern  . Not on file   Social History Narrative    Objective:  Filed Vitals:   04/27/14 1629  BP: 122/84  Pulse: 65  Temp: 97.9 F (36.6 C)  Resp: 16     General appearance: alert, cooperative and appears stated age Ears: normal TM's and external ear canals both ears Throat: lips, mucosa, and tongue normal; teeth and gums normal Neck: no adenopathy, no carotid bruit, supple, symmetrical, trachea midline and thyroid not enlarged, symmetric, no tenderness/mass/nodules Back: symmetric, no curvature. ROM normal. No CVA tenderness. Lungs: clear to auscultation bilaterally Heart: regular rate and rhythm, S1, S2 normal, no murmur, click, rub or gallop Abdomen: soft, non-tender; bowel sounds normal; no masses,  no organomegaly Pulses: 2+ and symmetric Skin: Skin color, texture, turgor normal. No rashes or lesions Lymph nodes: Cervical, supraclavicular, and axillary nodes normal.  Assessment and Plan:  Overweight She had a 9 lb st loss with belviq and wants to continue treatment but needs an alternative.  Phentermine and Contrava discussed,    I have written a prescription for  phentermine for 3 month.   She is aware of the possible side effects and risks and understands that    The medication  will be discontinued if she has not lost 5% of her body weight over the next 3 months, which , based on today's weight is 10 lbs.   Updated Medication List Outpatient Encounter Prescriptions as of 04/27/2014  Medication Sig  . aspirin 81 MG tablet Take 81 mg by mouth daily.  . hydrochlorothiazide (HYDRODIURIL) 25 MG tablet Take 1 tablet (25 mg total) by mouth daily.  Marland Kitchen. lisinopril (PRINIVIL,ZESTRIL) 10 MG tablet Take 1 tablet (10 mg total) by mouth daily.  . Lorcaserin HCl 10 MG TABS Take 1 tablet twice a day  . OVER THE COUNTER MEDICATION 1 tablet daily. Tumric  . phentermine  (ADIPEX-P) 37.5 MG tablet 1/2 tablet in the am and early afternoon     No orders of the defined types were placed in this encounter.    Return in about 3 months (around 07/27/2014).

## 2014-04-27 NOTE — Progress Notes (Signed)
Pre-visit discussion using our clinic review tool. No additional management support is needed unless otherwise documented below in the visit note.  

## 2014-04-27 NOTE — Patient Instructions (Signed)
I have authorized the use of phentermine for 3 months.  Please have your vital signs checked a week after starting, and return to see me in 3 months.take 1/2 tablet in the morning,  The second half by 1 PM to avoid insomnia If you have not lost 5% of your starting weight by the end of the  3 months we will have to take a break on it ( 10 lbs)  ciroc vodka : made from  Grapes Avoid tonic water unless diet Moderation in all things (that means one drink per night)     All of the foods can be found at grocery stores and in bulk at [employer].  The Atkins protein bars and shakes are available in more varieties at Target, WalMart and [employer].     7 AM Breakfast:  Choose from the following:  Low carbohydrate Protein  Shakes (I recommend the EAS AdvantEdge "Carb Control" shakes  Or the low carb shakes by Atkins.    2.5 carbs   Arnold's "Sandwhich Thin"toasted  w/ peanut butter (no jelly: about 20 net carbs  "Bagel Thin" with cream cheese and salmon: about 20 carbs   a scrambled egg/bacon/cheese burrito made with Mission's "carb balance" whole wheat tortilla  (about 10 net carbs )   Avoid cereal and bananas, oatmeal and cream of wheat and grits. They are loaded with carbohydrates!   10 AM: high protein snack  Protein bar by Atkins (the snack size, under 200 cal, usually < 6 net carbs).    A stick of cheese:  Around 1 carb,  100 cal     Dannon Light n Fit [place]Greek Yogurt  (80 cal, 8 carbs)  Other so called "protein bars" and Greek yogurts tend to be loaded with carbohydrates.  Remember, in food advertising, the word "energy" is synonymous for " carbohydrate."  Lunch:   A Sandwich using the bread choices listed, Can use any  Eggs,  lunchmeat, grilled meat or canned tuna), avocado, regular mayo/mustard  and cheese.  A Salad using blue cheese, ranch,  Goddess or vinagrette,  No croutons or "confetti" and no "candied nuts" but regular nuts OK.   No pretzels or chips.  Pickles and miniature sweet  peppers are a good low carb alternative that provide a "crunch"  The bread is the only source of carbohydrate in a sandwich and  can be decreased by trying some of these alternatives to traditional loaf bread  Joseph's makes a pita bread and a flat bread that are 50 cal and 4 net carbs available at BJs and WalMart.  This can be toasted to use with hummous as well  Toufayan makes a low carb flatbread that's 100 cal and 9 net carbs available at [employer] and [employer]  Mission makes 2 sizes of  Low carb whole wheat tortilla  (The large one is 210 cal and 6 net carbs) Avoid "Low fat dressings, as well as Reyne DumasCatalina and [address] dressings They are loaded with sugar!   3 PM/ Mid day  Snack:  Consider  1 ounce of  almonds, walnuts, pistachios, pecans, peanuts,  Macadamia nuts or a nut medley.  Avoid "granola"; the dried cranberries and raisins are loaded with carbohydrates. Mixed nuts as long as there are no raisins,  cranberries or dried fruit.    Try the prosciutto/mozzarella cheese sticks by Fiorruci  In deli /backery section   High protein      6 PM  Dinner:  Meat/fowl/fish with a green salad, and either broccoli, cauliflower, green beans, spinach, brussel sprouts or  Lima beans. DO NOT BREAD THE PROTEIN!!      There is a low carb pasta by Dreamfield's that is acceptable and tastes great: only 5 digestible carbs/serving.( All grocery stores but BJs carry it )  Try Kai LevinsMichel Angelo's chicken piccata or chicken or eggplant parm over low carb pasta.(Lowes and BJs)   Clifton CustardAaron Sanchez's "Carnitas" (pulled pork, no sauce,  0 carbs) or his beef pot roast to make a dinner burrito (at BJ's)  Pesto over low carb pasta (bj's sells a good quality pesto in the center refrigerated section of the deli   Try satueeing  Roosvelt HarpsBok Choy with mushroooms  Whole wheat pasta is still full of digestible carbs and  Not as low in glycemic index as Dreamfield's.   Brown rice is still rice,  So skip the rice and noodles if you eat  Congohinese or [place] Zealandhai (or at least limit to 1/2 cup)  9 PM snack :   Breyer's "low carb" fudgsicle or  ice cream bar (Carb Smart line), or  Weight Watcher's ice cream bar , or another "no sugar added" ice cream;  a serving of fresh berries/cherries with whipped cream   Cheese or DANNON'S LlGHT N FIT GREEK YOGURT  8 ounces of [place] Diamond unsweetened almond/cococunut milk    Avoid bananas, pineapple, grapes  and watermelon on a regular basis because they are high in sugar.  THINK OF THEM AS DESSERT  Remember that snack Substitutions should be less than 10 NET carbs per serving and meals < 20 carbs. Remember to subtract fiber grams to get the "net carbs." .

## 2014-04-29 NOTE — Assessment & Plan Note (Signed)
She had a 9 lb st loss with belviq and wants to continue treatment but needs an alternative.  Phentermine and Contrava discussed,    I have written a prescription for  phentermine for 3 month.   She is aware of the possible side effects and risks and understands that    The medication will be discontinued if she has not lost 5% of her body weight over the next 3 months, which , based on today's weight is 10 lbs.

## 2014-09-10 NOTE — Op Note (Signed)
PATIENT NAME:  Ricarda FrameMEACHAM, Macaila A MR#:  [id number] DATE OF BIRTH:  1965/07/25  DATE OF PROCEDURE:  07/02/2013.  PREOPERATIVE DIAGNOSIS:  Internal derangement, left knee.   POSTOPERATIVE DIAGNOSES:  Severe chondromalacia, patella, left knee.   PROCEDURE:  Arthroscopic lateral retinaculum release, left knee.   SURGEON:  Clare Gandyhristopher E. Kolyn Rozario, M.D.   ANESTHESIA:  General.   COMPLICATIONS:  None.   ESTIMATED BLOOD LOSS:  25 mL.   DESCRIPTION OF PROCEDURE:  After adequate induction of general anesthesia, the left lower extremity is placed in the legholder in the usual manner for arthroscopy. The left knee and leg are thoroughly prepped with alcohol and ChloraPrep and draped in standard sterile fashion. The joint is infiltrated with 10 mL of Marcaine with epinephrine. Diagnostic arthroscopy is performed. There is mild synovitis in the suprapatellar pouch. There is a severe obvious chondromalacia with fraying of the cartilaginous surface on almost the entire posterior aspect of the patella. Surprisingly the anterior femoral surface is reasonably well preserved. The medial and lateral compartments show some very mild chondromalacia of the articular surface but the menisci are intact. The anterior cruciate ligament is normal. A 3.5 full radial resector is then used to carefully shave the shagginess of the articular surface of the patella back to reasonably normal cartilage. Distinct care is taken not to damage any normal cartilage. The ArthroWand is then used to perform an arthroscopic lateral release extending from about 1 inch above the patella down to the patellar tendon insertion. The wound is thoroughly irrigated multiple times. The skin edges are closed with 5-0 nylon. The joint is infiltrated with 20 mL of Marcaine with epinephrine with 4 mg of morphine and 1 mL of Depo-Medrol. A soft bulky dressing is applied. The patient is returned to the recovery room in satisfactory condition having tolerated the  procedure quite well.   ____________________________ Clare Gandyhristopher E. Kennie Snedden, MD ces:jm D: 07/02/2013 09:39:39 ET T: 07/02/2013 10:10:33 ET JOB#: [id number]  cc: Clare Gandyhristopher E. Crescent Gotham, MD, <Dictator> Clare GandyHRISTOPHER E Xzavion Doswell MD ELECTRONICALLY SIGNED 07/05/2013 10:02

## 2014-11-07 ENCOUNTER — Other Ambulatory Visit: Payer: Self-pay | Admitting: Internal Medicine

## 2014-11-07 ENCOUNTER — Other Ambulatory Visit: Payer: Self-pay | Admitting: Nurse Practitioner

## 2014-11-07 DIAGNOSIS — Z1231 Encounter for screening mammogram for malignant neoplasm of breast: Secondary | ICD-10-CM

## 2014-11-09 ENCOUNTER — Ambulatory Visit: Payer: BC Managed Care – PPO

## 2014-11-11 ENCOUNTER — Ambulatory Visit
Admission: RE | Admit: 2014-11-11 | Discharge: 2014-11-11 | Disposition: A | Discharge status: Home / Self Care | Payer: BC Managed Care – PPO | Source: Ambulatory Visit | Attending: Internal Medicine | Admitting: Internal Medicine

## 2014-11-11 DIAGNOSIS — Z1231 Encounter for screening mammogram for malignant neoplasm of breast: Secondary | ICD-10-CM | POA: Insufficient documentation

## 2014-11-23 ENCOUNTER — Ambulatory Visit: Payer: BC Managed Care – PPO | Admitting: Podiatry

## 2014-12-01 ENCOUNTER — Encounter: Payer: Self-pay | Admitting: Podiatry

## 2014-12-01 ENCOUNTER — Ambulatory Visit (INDEPENDENT_AMBULATORY_CARE_PROVIDER_SITE_OTHER): Payer: BC Managed Care – PPO | Admitting: Podiatry

## 2014-12-01 ENCOUNTER — Ambulatory Visit (INDEPENDENT_AMBULATORY_CARE_PROVIDER_SITE_OTHER): Payer: BC Managed Care – PPO

## 2014-12-01 VITALS — BP 118/78 | HR 64 | Resp 16 | Ht 67.5 in | Wt 200.0 lb

## 2014-12-01 DIAGNOSIS — M722 Plantar fascial fibromatosis: Secondary | ICD-10-CM

## 2014-12-01 MED ORDER — MELOXICAM 15 MG PO TABS: 15.0000 mg | ORAL_TABLET | Freq: Every day | ORAL | Status: DC

## 2014-12-01 MED ORDER — TRIAMCINOLONE ACETONIDE 10 MG/ML IJ SUSP
10.0000 mg | Freq: Once | INTRAMUSCULAR | Status: AC
  Administered 2014-12-01: 10 mg

## 2014-12-01 NOTE — Patient Instructions (Signed)
Plantar Fasciitis (Heel Spur Syndrome) with Rehab The plantar fascia is a fibrous, ligament-like, soft-tissue structure that spans the bottom of the foot. Plantar fasciitis is a condition that causes pain in the foot due to inflammation of the tissue. SYMPTOMS   Pain and tenderness on the underneath side of the foot.  Pain that worsens with standing or walking. CAUSES  Plantar fasciitis is caused by irritation and injury to the plantar fascia on the underneath side of the foot. Common mechanisms of injury include:  Direct trauma to bottom of the foot.  Damage to a small nerve that runs under the foot where the main fascia attaches to the heel bone.  Stress placed on the plantar fascia due to bone spurs. RISK INCREASES WITH:   Activities that place stress on the plantar fascia (running, jumping, pivoting, or cutting).  Poor strength and flexibility.  Improperly fitted shoes.  Tight calf muscles.  Flat feet.  Failure to warm-up properly before activity.  Obesity. PREVENTION  Warm up and stretch properly before activity.  Allow for adequate recovery between workouts.  Maintain physical fitness:  Strength, flexibility, and endurance.  Cardiovascular fitness.  Maintain a health body weight.  Avoid stress on the plantar fascia.  Wear properly fitted shoes, including arch supports for individuals who have flat feet. PROGNOSIS  If treated properly, then the symptoms of plantar fasciitis usually resolve without surgery. However, occasionally surgery is necessary. RELATED COMPLICATIONS   Recurrent symptoms that may result in a chronic condition.  Problems of the lower back that are caused by compensating for the injury, such as limping.  Pain or weakness of the foot during push-off following surgery.  Chronic inflammation, scarring, and partial or complete fascia tear, occurring more often from repeated injections. TREATMENT  Treatment initially involves the use of  ice and medication to help reduce pain and inflammation. The use of strengthening and stretching exercises may help reduce pain with activity, especially stretches of the Achilles tendon. These exercises may be performed at home or with a therapist. Your caregiver may recommend that you use heel cups of arch supports to help reduce stress on the plantar fascia. Occasionally, corticosteroid injections are given to reduce inflammation. If symptoms persist for greater than 6 months despite non-surgical (conservative), then surgery may be recommended.  MEDICATION   If pain medication is necessary, then nonsteroidal anti-inflammatory medications, such as aspirin and ibuprofen, or other minor pain relievers, such as acetaminophen, are often recommended.  Do not take pain medication within 7 days before surgery.  Prescription pain relievers may be given if deemed necessary by your caregiver. Use only as directed and only as much as you need.  Corticosteroid injections may be given by your caregiver. These injections should be reserved for the most serious cases, because they may only be given a certain number of times. HEAT AND COLD  Cold treatment (icing) relieves pain and reduces inflammation. Cold treatment should be applied for 10 to 15 minutes every 2 to 3 hours for inflammation and pain and immediately after any activity that aggravates your symptoms. Use ice packs or massage the area with a piece of ice (ice massage).  Heat treatment may be used prior to performing the stretching and strengthening activities prescribed by your caregiver, physical therapist, or athletic trainer. Use a heat pack or soak the injury in warm water. SEEK IMMEDIATE MEDICAL CARE IF:  Treatment seems to offer no benefit, or the condition worsens.  Any medications produce adverse side effects. EXERCISES RANGE   OF MOTION (ROM) AND STRETCHING EXERCISES - Plantar Fasciitis (Heel Spur Syndrome) These exercises may help you  when beginning to rehabilitate your injury. Your symptoms may resolve with or without further involvement from your physician, physical therapist or athletic trainer. While completing these exercises, remember:   Restoring tissue flexibility helps normal motion to return to the joints. This allows healthier, less painful movement and activity.  An effective stretch should be held for at least 30 seconds.  A stretch should never be painful. You should only feel a gentle lengthening or release in the stretched tissue. RANGE OF MOTION - Toe Extension, Flexion  Sit with your right / left leg crossed over your opposite knee.  Grasp your toes and gently pull them back toward the top of your foot. You should feel a stretch on the bottom of your toes and/or foot.  Hold this stretch for __________ seconds.  Now, gently pull your toes toward the bottom of your foot. You should feel a stretch on the top of your toes and or foot.  Hold this stretch for __________ seconds. Repeat __________ times. Complete this stretch __________ times per day.  RANGE OF MOTION - Ankle Dorsiflexion, Active Assisted  Remove shoes and sit on a chair that is preferably not on a carpeted surface.  Place right / left foot under knee. Extend your opposite leg for support.  Keeping your heel down, slide your right / left foot back toward the chair until you feel a stretch at your ankle or calf. If you do not feel a stretch, slide your bottom forward to the edge of the chair, while still keeping your heel down.  Hold this stretch for __________ seconds. Repeat __________ times. Complete this stretch __________ times per day.  STRETCH - Gastroc, Standing  Place hands on wall.  Extend right / left leg, keeping the front knee somewhat bent.  Slightly point your toes inward on your back foot.  Keeping your right / left heel on the floor and your knee straight, shift your weight toward the wall, not allowing your back to  arch.  You should feel a gentle stretch in the right / left calf. Hold this position for __________ seconds. Repeat __________ times. Complete this stretch __________ times per day. STRETCH - Soleus, Standing  Place hands on wall.  Extend right / left leg, keeping the other knee somewhat bent.  Slightly point your toes inward on your back foot.  Keep your right / left heel on the floor, bend your back knee, and slightly shift your weight over the back leg so that you feel a gentle stretch deep in your back calf.  Hold this position for __________ seconds. Repeat __________ times. Complete this stretch __________ times per day. STRETCH - Gastrocsoleus, Standing  Note: This exercise can place a lot of stress on your foot and ankle. Please complete this exercise only if specifically instructed by your caregiver.   Place the ball of your right / left foot on a step, keeping your other foot firmly on the same step.  Hold on to the wall or a rail for balance.  Slowly lift your other foot, allowing your body weight to press your heel down over the edge of the step.  You should feel a stretch in your right / left calf.  Hold this position for __________ seconds.  Repeat this exercise with a slight bend in your right / left knee. Repeat __________ times. Complete this stretch __________ times per day.    STRENGTHENING EXERCISES - Plantar Fasciitis (Heel Spur Syndrome)  These exercises may help you when beginning to rehabilitate your injury. They may resolve your symptoms with or without further involvement from your physician, physical therapist or athletic trainer. While completing these exercises, remember:   Muscles can gain both the endurance and the strength needed for everyday activities through controlled exercises.  Complete these exercises as instructed by your physician, physical therapist or athletic trainer. Progress the resistance and repetitions only as guided. STRENGTH -  Towel Curls  Sit in a chair positioned on a non-carpeted surface.  Place your foot on a towel, keeping your heel on the floor.  Pull the towel toward your heel by only curling your toes. Keep your heel on the floor.  If instructed by your physician, physical therapist or athletic trainer, add ____________________ at the end of the towel. Repeat __________ times. Complete this exercise __________ times per day. STRENGTH - Ankle Inversion  Secure one end of a rubber exercise band/tubing to a fixed object (table, pole). Loop the other end around your foot just before your toes.  Place your fists between your knees. This will focus your strengthening at your ankle.  Slowly, pull your big toe up and in, making sure the band/tubing is positioned to resist the entire motion.  Hold this position for __________ seconds.  Have your muscles resist the band/tubing as it slowly pulls your foot back to the starting position. Repeat __________ times. Complete this exercises __________ times per day.  Document Released: 05/06/2005 Document Revised: 07/29/2011 Document Reviewed: 08/18/2008 [employer] Patient Information 2015 [employer]. This information is not intended to replace advice given to you by your health care provider. Make sure you discuss any questions you have with your health care provider.  

## 2014-12-02 ENCOUNTER — Encounter: Payer: Self-pay | Admitting: Internal Medicine

## 2014-12-04 NOTE — Progress Notes (Signed)
Subjective:     Patient ID: [name], female   DOB: [birth year], 49 y.o.   MRN: [id number]  HPI 49 year old female presents the office today with complaints of bilateral heel pain which is been ongoing for approximately 14 years. She states that over the last year she has had significant increase in symptoms. She had most her pain to the bottom of her heel which is intermittent in nature. She does have some pain in the morning or after periods of inactivity have she does have pain after being on her feet for quite some time. She states of the area feels tight. She denies any history of injury or trauma denies any change or increase activity recently. She denies any numbness or tingling. The pain does not wake her up at night. Denies any swelling or redness. She has had on-and-off treatment of the last several years including stretching, icing in shoe gear changes. No other complaints at this time.  Review of Systems  All other systems reviewed and are negative.      Objective:   Physical Exam AAO x3, NAD DP/PT pulses palpable bilaterally, CRT less than 3 seconds Protective sensation intact with Simms Weinstein monofilament, vibratory sensation intact, Achilles tendon reflex intact There is tenderness to palpation overlying the plantar medial tubercle of the calcaneus to bilateral heels at the insertion of the plantar fascia. There is no pain along the course of plantar fascial within the arch of the foot. There is no pain with lateral compression of the calcaneus or pain the vibratory sensation. No pain on the posterior aspect of the calcaneus or along the course/insertion of the Achilles tendon. There is no overlying edema, erythema, increase in warmth. No other areas of tenderness palpation or pain with vibratory sensation to the foot/ankle. MMT 5/5, ROM WNL No open lesions or pre-ulcerative lesions are identified. No pain with calf compression, swelling, warmth, erythema.      Assessment:     49 year old female with bilateral heel pain, likely plantar fasciitis     Plan:     -Treatment options discussed including all alternatives, risks, and complications -X-rays were obtained and reviewed with the patient.  Patient elects to proceed with steroid injection into the bilateral heels. Under sterile skin preparation, a total of 2.5cc of kenalog 10, 0.5% Marcaine plain, and 2% lidocaine plain were infiltrated into the symptomatic area without complication. A band-aid was applied. Patient tolerated the injection well without complication. Post-injection care with discussed with the patient. Discussed with the patient to ice the area over the next couple of days to help prevent a steroid flare.  -Dispensed plantar fascial brace -I do believe that orthotics would be  Beneficial for her. I discussed custom with her. She will hold off on this at this time. -Stretching, icing it is not consistent basis. -Discussed shoegear modifications. -Prescribed mobic. Discussed side effects of the medication and directed to stop if any are to occur and call the office.  -Follow-up 3 weeks or sooner if any problems arise. In the meantime, encouraged to call the office with any questions, concerns, change in symptoms.   Ovid CurdMatthew Wagoner, DPM

## 2014-12-09 ENCOUNTER — Ambulatory Visit (INDEPENDENT_AMBULATORY_CARE_PROVIDER_SITE_OTHER): Payer: BC Managed Care – PPO | Admitting: Nurse Practitioner

## 2014-12-09 VITALS — BP 118/80 | HR 72 | Temp 98.8°F | Resp 16 | Ht 68.0 in | Wt 201.0 lb

## 2014-12-09 DIAGNOSIS — I10 Essential (primary) hypertension: Secondary | ICD-10-CM

## 2014-12-09 MED ORDER — LISINOPRIL 10 MG PO TABS: 10.0000 mg | ORAL_TABLET | Freq: Every day | ORAL | Status: DC

## 2014-12-09 MED ORDER — HYDROCHLOROTHIAZIDE 25 MG PO TABS: 25.0000 mg | ORAL_TABLET | Freq: Every day | ORAL | Status: DC

## 2014-12-09 NOTE — Progress Notes (Signed)
   Subjective:    Patient ID: [name], female    DOB: Jul 21, 1965, 49 y.o.   MRN: [id number]  HPI  [name] is a 49 yo female with a follow up of HTN.  1) Doing well, has it checked at school and reports it has been slightly above 140/90 occasionally, but feels this is stress related to being at school.   Review of Systems  Constitutional: Negative for fever, chills, diaphoresis and fatigue.  Respiratory: Negative for chest tightness, shortness of breath and wheezing.   Cardiovascular: Negative for chest pain, palpitations and leg swelling.  Gastrointestinal: Negative for nausea, vomiting and diarrhea.  Skin: Negative for rash.  Neurological: Negative for dizziness, weakness, numbness and headaches.  Psychiatric/Behavioral: The patient is not nervous/anxious.       Objective:   Physical Exam  Constitutional: She is oriented to person, place, and time. She appears well-developed and well-nourished. No distress.  BP 118/80 mmHg  Pulse 72  Temp(Src) 98.8 F (37.1 C)  Resp 16  Ht  (1.727 m)  Wt 201 lb (91.173 kg)  BMI 30.57 kg/m2  SpO2 96%   HENT:  Head: Normocephalic and atraumatic.  Right Ear: External ear normal.  Left Ear: External ear normal.  Eyes: Right eye exhibits no discharge. Left eye exhibits no discharge. No scleral icterus.  Cardiovascular: Normal rate and regular rhythm.   Pulmonary/Chest: Effort normal and breath sounds normal. No respiratory distress. She has no wheezes. She has no rales. She exhibits no tenderness.  Neurological: She is alert and oriented to person, place, and time. No cranial nerve deficit. She exhibits normal muscle tone. Coordination normal.  Skin: Skin is warm and dry. No rash noted. She is not diaphoretic.  Psychiatric: She has a normal mood and affect. Her behavior is normal. Judgment and thought content normal.      Assessment & Plan:

## 2014-12-09 NOTE — Progress Notes (Signed)
Pre visit review using our clinic review tool, if applicable. No additional management support is needed unless otherwise documented below in the visit note. 

## 2014-12-09 NOTE — Patient Instructions (Addendum)
Work on weight loss for 1 month!   Check BP once you start back on your phentermine.

## 2014-12-21 ENCOUNTER — Encounter: Payer: Self-pay | Admitting: Nurse Practitioner

## 2014-12-21 NOTE — Assessment & Plan Note (Signed)
Stable. HCTZ refilled and to continue as well as Lisinopril refilled and pt to continue. FU prn worsening/failure to improve.  Watch salt intake and drink lots of non-caffeine beverages.

## 2014-12-22 ENCOUNTER — Ambulatory Visit (INDEPENDENT_AMBULATORY_CARE_PROVIDER_SITE_OTHER): Payer: BC Managed Care – PPO | Admitting: Podiatry

## 2014-12-22 DIAGNOSIS — M722 Plantar fascial fibromatosis: Secondary | ICD-10-CM

## 2014-12-22 MED ORDER — TRIAMCINOLONE ACETONIDE 10 MG/ML IJ SUSP
10.0000 mg | Freq: Once | INTRAMUSCULAR | Status: AC
  Administered 2014-12-22: 10 mg

## 2014-12-22 NOTE — Progress Notes (Signed)
Patient ID: [name], female   DOB: 05/16/1966, 49 y.o.   MRN: [id number]  Subjective: 49 year old female presents the office in for follow-up evaluation of bilateral heel pain. She says that overall she is doing significantly better than what she was last appointment. She has some tightness her left foot although she does not have any pain. She has some mild discomfort which started to recur over the last day or so to her right heel. She's continue the stretching exercises although she has not been icing as her pain has been improved. No other complaints at this time in no acute changes since last appointment.  Objective: AAO x3, NAD DP/PT pulses palpable bilaterally, CRT less than 3 seconds Protective sensation intact with Simms Weinstein monofilament, vibratory sensation intact, Achilles tendon reflex intact There is mild tenderness to palpation overlying the plantar medial tubercle of the calcaneus to right heel at the insertion of the plantar fascia. There is no significant tenderness to the left heel at this time. There is no pain along the course of plantar fascia within the arch of the foot. There is no pain with lateral compression of the calcaneus or pain the vibratory sensation. No pain on the posterior aspect of the calcaneus or along the course/insertion of the Achilles tendon. There is no overlying edema, erythema, increase in warmth. No other areas of tenderness palpation or pain with vibratory sensation to the foot/ankle. MMT 5/5, ROM WNL No open lesions or pre-ulcerative lesions are identified. No pain with calf compression, swelling, warmth, erythema.  Assessment: 49 year old female with resolving heel pain with mild discomfort on the right heel.  Plan: -Treatment options discussed including all alternatives, risks, and complications Patient elects to proceed with steroid injection into the right heel. Under sterile skin preparation, a total of 2.5cc of kenalog 10, 0.5%  Marcaine plain, and 2% lidocaine plain were infiltrated into the symptomatic area without complication. A band-aid was applied. Patient tolerated the injection well without complication. Post-injection care with discussed with the patient. Discussed with the patient to ice the area over the next couple of days to help prevent a steroid flare.  -Continue icing and stretching activity is on a consistent basis. -Meloxicam as needed. -Discussion complications orthotics. -Follow-up if symptoms are not resolved completely in 4 weeks or sooner if any problems arise. In the meantime, encouraged to call the office with any questions, concerns, change in symptoms.   Ovid Curd, DPM

## 2015-01-24 ENCOUNTER — Telehealth: Payer: Self-pay | Admitting: Surgical

## 2015-01-24 NOTE — Telephone Encounter (Signed)
Pharmacy is faxing request for Phentermine 37.5 MG Tablets take .5 tablet in the AM and early aftrenoon. Please advise on refill.

## 2015-01-24 NOTE — Telephone Encounter (Signed)
Patient stated that she does not want to come in for appointment at this time. She will call for an appointment at a later time

## 2015-01-24 NOTE — Telephone Encounter (Signed)
No refills without an appt since I have not prescribed it or seen her in over 3 months

## 2015-02-13 ENCOUNTER — Encounter: Payer: Self-pay | Admitting: Nurse Practitioner

## 2015-02-13 ENCOUNTER — Ambulatory Visit (INDEPENDENT_AMBULATORY_CARE_PROVIDER_SITE_OTHER): Payer: BC Managed Care – PPO | Admitting: Nurse Practitioner

## 2015-02-13 VITALS — BP 136/70 | HR 85 | Temp 98.8°F | Resp 16 | Ht 68.0 in | Wt 205.6 lb

## 2015-02-13 DIAGNOSIS — B029 Zoster without complications: Secondary | ICD-10-CM | POA: Diagnosis not present

## 2015-02-13 NOTE — Progress Notes (Signed)
Pre visit review using our clinic review tool, if applicable. No additional management support is needed unless otherwise documented below in the visit note. 

## 2015-02-13 NOTE — Progress Notes (Signed)
Patient ID: [name], female    DOB: Jul 16, 1965  Age: 49 y.o. MRN: [id number]  CC: Herpes Zoster   HPI [name] presents for Shingles diagnosed by Urgent care on Friday.   1) Friday- went to [facility] and diagnosed with shingles of the face. She was at school, looked in the mirror and saw "little fish eggs" on right side of face. Went to UC and started Anti-virals. No pain medications were needed she reports. She reports she is interested in getting the shingles vaccine and willing to pay out of pocket. Wants to know about timing of vaccine, when to return to work, and course of action.   History [name] has a past medical history of Depression; Heart murmur; Hypertension; Anemia; History of genital warts; History of chicken pox; and H/O scarlet fever.   She has past surgical history that includes Abdominal hysterectomy (2005) and Knee surgery (Left).   Her family history includes Hyperlipidemia in her mother; Hypertension in her mother; Stroke in her maternal grandmother.She reports that she has never smoked. She does not have any smokeless tobacco history on file. She reports that she drinks about 3.6 oz of alcohol per week. She reports that she does not use illicit drugs.  Outpatient Prescriptions Prior to Visit  Medication Sig Dispense Refill  . aspirin 81 MG tablet Take 81 mg by mouth daily.    . hydrochlorothiazide (HYDRODIURIL) 25 MG tablet Take 1 tablet (25 mg total) by mouth daily. 30 tablet 5  . lisinopril (PRINIVIL,ZESTRIL) 10 MG tablet Take 1 tablet (10 mg total) by mouth daily. 30 tablet 5  . Lorcaserin HCl 10 MG TABS Take 1 tablet twice a day 60 tablet 3  . meloxicam (MOBIC) 15 MG tablet Take 1 tablet (15 mg total) by mouth daily. 30 tablet 2  . OVER THE COUNTER MEDICATION 1 tablet daily. Tumric     No facility-administered medications prior to visit.    ROS Review of Systems  Constitutional: Negative for fever, chills, diaphoresis and fatigue.  Respiratory:  Negative for chest tightness, shortness of breath and wheezing.   Skin: Positive for rash.  Neurological: Negative for dizziness, numbness and headaches.    Objective:  BP 136/70 mmHg  Pulse 85  Temp(Src) 98.8 F (37.1 C)  Resp 16  Ht  (1.727 m)  Wt 205 lb 9.6 oz (93.26 kg)  BMI 31.27 kg/m2  SpO2 94%  Physical Exam  Constitutional: She is oriented to person, place, and time. She appears well-developed and well-nourished. No distress.  HENT:  Head: Normocephalic and atraumatic.    Right Ear: External ear normal.  Left Ear: External ear normal.  Quarter sized area, scabbing over of area with neosporin currently on site  Cardiovascular: Normal rate, regular rhythm and normal heart sounds.  Exam reveals no gallop and no friction rub.   No murmur heard. Pulmonary/Chest: Effort normal and breath sounds normal. No respiratory distress. She has no wheezes. She has no rales. She exhibits no tenderness.  Neurological: She is alert and oriented to person, place, and time. No cranial nerve deficit. She exhibits normal muscle tone. Coordination normal.  Skin: Skin is warm and dry. No rash noted. She is not diaphoretic.  Psychiatric: She has a normal mood and affect. Her behavior is normal. Judgment and thought content normal.    Assessment & Plan:   Mazal was seen today for herpes zoster.  Diagnoses and all orders for this visit:  Shingles  I am having [name]  maintain her aspirin, OVER THE COUNTER MEDICATION, Lorcaserin HCl, meloxicam, hydrochlorothiazide, and lisinopril.  No orders of the defined types were placed in this encounter.     Follow-up: Return if symptoms worsen or fail to improve.

## 2015-02-13 NOTE — Patient Instructions (Signed)

## 2015-04-06 ENCOUNTER — Telehealth: Payer: Self-pay | Admitting: *Deleted

## 2015-04-06 MED ORDER — MELOXICAM 15 MG PO TABS: 15.0000 mg | ORAL_TABLET | Freq: Every day | ORAL | Status: DC

## 2015-04-06 NOTE — Telephone Encounter (Signed)
Pt states she is going out-of-town and would like a refill of the antiinflammatory.  Dr. Bary CastillaWagoner okayed refill Meloxicam without refills, must be seen if continuing to have a problem.

## 2015-06-18 ENCOUNTER — Encounter: Payer: Self-pay | Admitting: Nurse Practitioner

## 2015-06-18 DIAGNOSIS — S42209A Unspecified fracture of upper end of unspecified humerus, initial encounter for closed fracture: Secondary | ICD-10-CM | POA: Insufficient documentation

## 2015-06-18 HISTORY — DX: Unspecified fracture of upper end of unspecified humerus, initial encounter for closed fracture: S42.209A

## 2015-06-20 ENCOUNTER — Encounter: Payer: Self-pay | Admitting: Physical Therapy

## 2015-06-20 ENCOUNTER — Ambulatory Visit: Payer: BC Managed Care – PPO | Attending: Specialist | Admitting: Physical Therapy

## 2015-06-20 DIAGNOSIS — M6281 Muscle weakness (generalized): Secondary | ICD-10-CM | POA: Insufficient documentation

## 2015-06-20 DIAGNOSIS — M25511 Pain in right shoulder: Secondary | ICD-10-CM | POA: Insufficient documentation

## 2015-06-20 DIAGNOSIS — M25611 Stiffness of right shoulder, not elsewhere classified: Secondary | ICD-10-CM | POA: Diagnosis not present

## 2015-06-20 NOTE — Therapy (Signed)
[facility] [address], [place], [address] Phone: [phone]   Fax:  [phone]  Physical Therapy Evaluation/Treatement  Patient Details  Name: [name] MRN: [id number] Date of Birth: 16-May-1966 Referring Provider: Myra Rude MD  Encounter Date: 06/20/2015      PT End of Session - 06/20/15 2000    Visit Number 1   Number of Visits 8   Date for PT Re-Evaluation 07/18/15   PT Start Time 1843   PT Stop Time 1945   PT Time Calculation (min) 62 min   Activity Tolerance Patient tolerated treatment well   Behavior During Therapy [facility] for tasks assessed/performed      Past Medical History  Diagnosis Date  . Depression   . Heart murmur   . Hypertension   . Anemia   . History of genital warts   . History of chicken pox   . H/O scarlet fever     Past Surgical History  Procedure Laterality Date  . Abdominal hysterectomy  2005  . Knee surgery Left     2015    There were no vitals filed for this visit.  Visit Diagnosis:  Shoulder stiffness, right - Plan: PT plan of care cert/re-cert  Pain in right shoulder - Plan: PT plan of care cert/re-cert  Muscle weakness of right upper extremity - Plan: PT plan of care cert/re-cert      Subjective Assessment - 06/20/15 1855    Subjective Patient reports she is weak and stiff in right shoulder and UE s/p proximal humerus fracture 04/17/2015. 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    Limitations Lifting;House hold activities;Other (comment)  raising arm above shoulder level   Patient Stated Goals to improved ROM and strength right shoulder/UE back to normal to return to prior level of funciton   Currently in Pain? Yes   Pain Score 4    Pain Location Shoulder   Pain Orientation Right   Pain Descriptors / Indicators  Aching;Tightness;Sore;Spasm   Pain Type Acute pain   Pain Onset More than a month ago  04/14/2015   Pain Frequency Intermittent  worse at night going to bed   Aggravating Factors  using right UE, overhead activity   Pain Relieving Factors ice, hot showers, rest   Effect of Pain on Daily Activities limits normal motion for personal care, work  related activities   Multiple Pain Sites No            [facility] PT Assessment - 06/20/15 1846    Assessment   Medical Diagnosis Closed fracture of upper end of humerus    Referring Provider Myra Rude MD   Onset Date/Surgical Date 04/14/15   Hand Dominance Right   Next MD Visit unknown   Prior Therapy yes January 2017 (3) visits   Precautions   Precautions None   Balance Screen   Has the patient fallen in the past 6 months No   Has the patient had a decrease in activity level because of a fear of falling?  No   Is the patient reluctant to leave their home because of a fear of falling?  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   Leisure cooking,    Cognition   Overall Cognitive Status Within Functional Limits for tasks assessed        Objective: Posture: right shoulder forward as compared to left, + hiking right shoulder AAROM right shoulder (supine): flexion: 90/100, ER: 20 Strength: grossly with static testing in supine lying forward elevation 4-/5, extension 4-/5, ER/IR 4/5 Palpation: + spasms along cervical spine right, upper trapezius right, pectoral muscles right  Treatment: Manual therapy: soft tissue mobilization to right upper  trapezius, cervical spine, pectoral muscles, upper trapezius release with patient side lying left Exercise: patient performed exercises with guidance, verbal and tactile cues and demonstration of PT: AAROM right shoulder forward flexion in supine lying, AROM ER in supine, side lying and standing with proper positioning of shoulder, proper use of pulleys with patient facing pulley and controlling hiking of right shoulder, scapular adduction in sitting/standing to be performed 5x every hour, pendulums performed with demonstration, corrected technique, side lying shoulder elevation/depression with scapular control and tactile cues x 10 reps  Patient response to treatment: improved soft tissue elasticity with improved AAROM forward elevation to 105 degrees with less stiffness in shoulder and demonstrated improved technique with decreased hiking of right shoulder with all exercises with verbal cued, tactile cues and repetition          PT Education - 06/20/15 1945    Education provided Yes   Education Details HEP, proper posture, positioning of right shoulder during alll exercises   Person(s) Educated Patient   Methods Explanation;Tactile cues;Verbal cues;Handout;Demonstration   Comprehension Verbalized understanding;Returned demonstration;Verbal cues required;Tactile cues required             PT Long Term Goals - 06/20/15 2000    PT LONG TERM GOAL #1   Title Patient will demonstrate improved function with daily activties with decreased pain with Quick Dash score of 35% by 07/18/2015   Baseline QuickDash 50% impairment   Status New   PT LONG TERM GOAL #2   Title Patient will have 120 degrees of active forward elevation and 45 degrees ER by 07/18/2015 for improved functional use right shoulder/UE for personal care and work related tasks above shoulder   Baseline AROM right shoulder 80 degrees with hiking right shoulder (supine lying 100 degrees), ER 20 degrees   Status New   PT LONG TERM GOAL  #3   Title Patient will be independent with home program for self management of pain and exercise for ROM and strengthening right UE/shoulder by 07/18/2015 in order to continue to progress towards full functional use right UE and prior level of funciton    Baseline limited knowledge of appropriate exercise and progression in order to retur to full functional use wihtout difficulty/limitaitons   Status New               Plan - 06/20/15 2000    Clinical Impression Statement Patient is a 50 year old right hand dominant female who presents with limitations of ROM, strength in right UE s/p upper end of right humers fracture 04/17/2015. She is limited in daily tasks at home, work and community involving using right UE above shoulder level and with lifing activties. She has limited knowledge of appropriate exercises and progression to improve strength and ROM in  order to return to prior level of function.    Pt will benefit from skilled therapeutic intervention in order to improve on the following deficits Decreased strength;Pain;Impaired UE functional use;Increased muscle spasms;Decreased range of motion   Rehab Potential Good   Clinical Impairments Affecting Rehab Potential (+) motivated, active lifestyle, acute condition    PT Frequency 2x / week   PT Duration 4 weeks   PT Treatment/Interventions Manual techniques;Neuromuscular re-education;Patient/family education;Cryotherapy;Therapeutic exercise;Moist Heat;Electrical Stimulation   PT Next Visit Plan manual therapy, progressive exericse for strength and ROM    PT Home Exercise Plan pendulums, pulleys, AAROM, scapular control, posture correction throughout the day   Consulted and Agree with Plan of Care Patient         Problem List Patient Active Problem List   Diagnosis Date Noted  . Closed fracture of part of upper end of humerus 06/18/2015  . Shingles 02/13/2015  . Iron deficiency anemia 12/29/2013  . Essential hypertension  12/29/2013  . Plantar fasciitis 12/29/2013  . Overweight 12/29/2013    Beacher May PT 06/20/2015, 10:09 PM   [facility] AND SPORTS MEDICINE [address], [place], [address] Phone: [phone]   Fax:  [phone]  Name: [name] MRN: [id number] Date of Birth: [birth year]

## 2015-06-22 ENCOUNTER — Encounter: Payer: Self-pay | Admitting: Physical Therapy

## 2015-06-22 ENCOUNTER — Ambulatory Visit: Payer: BC Managed Care – PPO | Attending: Specialist | Admitting: Physical Therapy

## 2015-06-22 DIAGNOSIS — M6281 Muscle weakness (generalized): Secondary | ICD-10-CM | POA: Insufficient documentation

## 2015-06-22 DIAGNOSIS — M25611 Stiffness of right shoulder, not elsewhere classified: Secondary | ICD-10-CM | POA: Insufficient documentation

## 2015-06-22 DIAGNOSIS — M25511 Pain in right shoulder: Secondary | ICD-10-CM

## 2015-06-23 ENCOUNTER — Other Ambulatory Visit: Payer: Self-pay | Admitting: Nurse Practitioner

## 2015-06-23 NOTE — Therapy (Signed)
[facility] [address] [place], [address] Phone: [phone]   Fax:  [phone]  Physical Therapy Treatment  Patient Details  Name: [name] MRN: [id number] Date of Birth: 07/03/1965 Referring Provider: Myra Rude MD  Encounter Date: 06/22/2015      PT End of Session - 06/23/15 2117    Visit Number 2   Number of Visits 8   Date for PT Re-Evaluation 07/18/15   PT Start Time 1940   PT Stop Time 2015   PT Time Calculation (min) 35 min   Activity Tolerance Patient tolerated treatment well   Behavior During Therapy [facility] for tasks assessed/performed      Past Medical History  Diagnosis Date  . Depression   . Heart murmur   . Hypertension   . Anemia   . History of genital warts   . History of chicken pox   . H/O scarlet fever     Past Surgical History  Procedure Laterality Date  . Abdominal hysterectomy  2005  . Knee surgery Left     2015    There were no vitals filed for this visit.  Visit Diagnosis:  Shoulder stiffness, right  Pain in right shoulder  Muscle weakness of right upper extremity      Subjective Assessment - 06/22/15 1944    Subjective Paitent reports she is doing better and the right shoulder is feeling more "normal" and she is now able to lift her arm out to the side and put deodorant better. Her right shoulder is stiff and    Limitations Lifting;House hold activities;Other (comment)   Patient Stated Goals to improved ROM and strength right shoulder/UE back to normal to return to prior level of funciton   Currently in Pain? Yes   Pain Score 4    Pain Location Shoulder   Pain Orientation Right   Pain Descriptors / Indicators Aching;Tightness;Sore;Spasm   Pain Type Acute pain   Pain Onset More than a month ago  04/14/15   Pain Frequency Intermittent       Objective:  Posture: right shoulder more relaxed without obvious hiking of shoulder in  standing Palpation: + spasms along cervical spine right, upper trapezius right, pectoral muscles right  Treatment: Manual therapy: soft tissue mobilization to right upper trapezius, cervical spine, pectoral muscles, upper trapezius release with patient side lying left 3 x 10-20 seconds Exercise: patient performed exercises with guidance, verbal and tactile cues and demonstration of PT: AAROM right shoulder forward flexion in supine lying, AROM ER in supine and side lying  with proper positioning of shoulder, AAROM supine lying while holding onto 2# weight for AAROM/strengthening 2 x 5 reps with gradual increasing ROM, use of pulleys with patient facing pulley and controlling hiking of right shoulder, pendulums performed with demonstration, corrected technique, side lying shoulder elevation/depression with scapular control and tactile cues x 10 reps, side lying assisted abduction with short lever arm and long lever arm x 5 reps  Patient response to treatment: improved soft tissue elasticity with improved AAROM forward elevation to 105 degrees with less stiffness in shoulder and demonstrated improved technique with decreased hiking of right shoulder with all exercises with verbal cued, tactile cues and repetition, side lying abduction not tolerated well therefore discontinued        PT Education - 06/22/15 2015    Education provided Yes   Education Details HEP: proper positioning of shoulder during exercises   Person(s) Educated Patient  Methods Explanation;Verbal cues;Tactile cues;Demonstration   Comprehension Verbalized understanding;Verbal cues required;Tactile cues required;Returned demonstration             PT Long Term Goals - 06/20/15 2000    PT LONG TERM GOAL #1   Title Patient will demonstrate improved function with daily activties with decreased pain with Quick Dash score of 35% by 07/18/2015   Baseline QuickDash 50% impairment   Status New   PT LONG TERM GOAL #2   Title  Patient will have 120 degrees of active forward elevation and 45 degrees ER by 07/18/2015 for improved functional use right shoulder/UE for personal care and work related tasks above shoulder   Baseline AROM right shoulder 80 degrees with hiking right shoulder (supine lying 100 degrees), ER 20 degrees   Status New   PT LONG TERM GOAL #3   Title Patient will be independent with home program for self management of pain and exercise for ROM and strengthening right UE/shoulder by 07/18/2015 in order to continue to progress towards full functional use right UE and prior level of funciton    Baseline limited knowledge of appropriate exercise and progression in order to retur to full functional use wihtout difficulty/limitaitons   Status New               Plan - 06/22/15 2025    Clinical Impression Statement Patient demonstrated improved AAROM right shoulder to 105 degrees forward elevation and able to control hiking of right shoulder with pulleys and ROM exercises. She continues with decreased ROM and strength and requires assistance and guidance to perform progressive exercises in order to return to pror level of funciton.    Pt will benefit from skilled therapeutic intervention in order to improve on the following deficits Decreased strength;Pain;Impaired UE functional use;Increased muscle spasms;Decreased range of motion   Rehab Potential Good   PT Frequency 2x / week   PT Duration 4 weeks   PT Treatment/Interventions Manual techniques;Neuromuscular re-education;Patient/family education;Cryotherapy;Therapeutic exercise;Moist Heat;Electrical Stimulation   PT Next Visit Plan manual therapy, progressive exericse for strength and ROM, AAROM on ball on treatment table,         Problem List Patient Active Problem List   Diagnosis Date Noted  . Closed fracture of part of upper end of humerus 06/18/2015  . Shingles 02/13/2015  . Iron deficiency anemia 12/29/2013  . Essential hypertension  12/29/2013  . Plantar fasciitis 12/29/2013  . Overweight 12/29/2013    Beacher May PT 06/23/2015, 9:28 PM  [facility] PHYSICAL AND SPORTS MEDICINE [address], [place], [address] Phone: [phone]   Fax:  [phone]  Name: [name] MRN: [id number] Date of Birth: 1965-11-20

## 2015-06-27 ENCOUNTER — Telehealth: Payer: Self-pay | Admitting: *Deleted

## 2015-06-27 MED ORDER — LISINOPRIL 10 MG PO TABS: 10.0000 mg | ORAL_TABLET | Freq: Every day | ORAL | Status: DC

## 2015-06-27 NOTE — Telephone Encounter (Signed)
Patient has requested a medication refill for lisinopril  Pharmacy [facility] [place].

## 2015-06-27 NOTE — Telephone Encounter (Signed)
Lisinopril filled

## 2015-06-28 ENCOUNTER — Encounter: Payer: BC Managed Care – PPO | Admitting: Physical Therapy

## 2015-06-29 ENCOUNTER — Encounter: Payer: Self-pay | Admitting: Physical Therapy

## 2015-06-29 ENCOUNTER — Ambulatory Visit: Payer: BC Managed Care – PPO | Admitting: Physical Therapy

## 2015-06-29 DIAGNOSIS — M25611 Stiffness of right shoulder, not elsewhere classified: Secondary | ICD-10-CM | POA: Diagnosis not present

## 2015-06-29 DIAGNOSIS — M25511 Pain in right shoulder: Secondary | ICD-10-CM

## 2015-06-29 DIAGNOSIS — M6281 Muscle weakness (generalized): Secondary | ICD-10-CM

## 2015-06-30 ENCOUNTER — Encounter: Payer: Self-pay | Admitting: Family Medicine

## 2015-06-30 ENCOUNTER — Ambulatory Visit (INDEPENDENT_AMBULATORY_CARE_PROVIDER_SITE_OTHER): Payer: BC Managed Care – PPO | Admitting: Family Medicine

## 2015-06-30 VITALS — BP 122/74 | HR 72 | Temp 98.2°F | Ht 67.5 in | Wt 200.2 lb

## 2015-06-30 DIAGNOSIS — H01136 Eczematous dermatitis of left eye, unspecified eyelid: Secondary | ICD-10-CM | POA: Diagnosis not present

## 2015-06-30 DIAGNOSIS — H019 Unspecified inflammation of eyelid: Secondary | ICD-10-CM | POA: Insufficient documentation

## 2015-06-30 MED ORDER — DESONIDE 0.05 % EX OINT: 1.0000 "application " | TOPICAL_OINTMENT | Freq: Every day | CUTANEOUS | Status: DC

## 2015-06-30 MED ORDER — OLOPATADINE HCL 0.2 % OP SOLN: OPHTHALMIC | Status: DC

## 2015-06-30 NOTE — Patient Instructions (Signed)
Use the drop and ointment as prescribed.  If does not improve you will need to see the eye doctor.  Take care  Dr. Adriana Simas

## 2015-06-30 NOTE — Assessment & Plan Note (Signed)
New problem. Unclear etiology. Treating with topical desonide. Cautioned about skin lightening and thinning. Advised very brief use. Also giving pataday for eye itching.

## 2015-06-30 NOTE — Progress Notes (Signed)
Pre visit review using our clinic review tool, if applicable. No additional management support is needed unless otherwise documented below in the visit note. 

## 2015-06-30 NOTE — Therapy (Signed)
[place] [facility] [address] [place], [address] Phone: [phone]   Fax:  [phone]  Physical Therapy Treatment  Patient Details  Name: [name] MRN: [id number] Date of Birth: 1966/01/01 Referring Provider: Myra Rude MD  Encounter Date: 06/29/2015      PT End of Session - 06/29/15 1958    Visit Number 3   Number of Visits 8   Date for PT Re-Evaluation 07/18/15   PT Start Time 1910   PT Stop Time 1955   PT Time Calculation (min) 45 min   Activity Tolerance Patient tolerated treatment well   Behavior During Therapy [facility] for tasks assessed/performed      Past Medical History  Diagnosis Date  . Depression   . Heart murmur   . Hypertension   . Anemia   . History of genital warts   . History of chicken pox   . H/O scarlet fever     Past Surgical History  Procedure Laterality Date  . Abdominal hysterectomy  2005  . Knee surgery Left     2015    There were no vitals filed for this visit.  Visit Diagnosis:  Shoulder stiffness, right  Pain in right shoulder  Muscle weakness of right upper extremity      Subjective Assessment - 06/29/15 1910    Subjective Patient reports she is feeling less stiffness and she is noticing it is easier to move and write on board with assstance of opposite arm and holding right arm at end range.    Limitations Lifting;House hold activities;Other (comment)   Patient Stated Goals to improved ROM and strength right shoulder/UE back to normal to return to prior level of funciton   Currently in Pain? Yes   Pain Score 3    Pain Location Shoulder   Pain Orientation Right   Pain Descriptors / Indicators Aching;Tightness   Pain Type Acute pain   Pain Onset More than a month ago  04/14/2015   Pain Frequency Intermittent         Objective:  Posture: right shoulder more relaxed without obvious hiking of shoulder in standing Palpation: + spasms  along cervical spine right, upper trapezius right, pectoral muscles right AROM: right shoulder in sitting pre treatment 0-100 degrees with mild hiking of shoulder         [facility] Adult PT Treatment/Exercise - 06/29/15 1914    Exercises   Exercises Other Exercises patient performed exercises with guidance, verbal and tactile cues and demonstration of PT: AAROM right shoulder forward flexion in supine lying, rhythmic stabilization performed with light resistance x 10 reps, AROM ER in supine and side lyingwith proper positioning of shoulder, AAROM supine lying while holding onto 2# weight for AAROM/strengthening 2 x 5 reps with gradual increasing ROM, side lying shoulder elevation/depression and protraction/retraction with scapular control and tactile cues x 10 reps, side lying ER while holding 1# weight with controlled motion (towel under arm)   Manual Therapy   Manual Therapy Soft tissue mobilization   Soft tissue mobilization right shoulder soft tissue mobilization to right upper trapezius, cervical spine, pectoral muscles, upper trapezius release with patient side lying left 3 x 10-20 seconds, scapular mobilization with lateral glides, rotation forward and back x 10 reps        Patient response to treatment: improved soft tissue elasticity with improved AAROM forward elevation to 110 degrees with less stiffness in shoulder and demonstrated improved technique with decreased hiking of  right shoulder with all exercises with verbal cues, tactile cues and repetition, improved motor control noted with all exercises           PT Education - 06/29/15 1955    Education provided Yes   Education Details HEP: self ROM, use of light weight for AAROM in supine and sitting, side lying rotation   Person(s) Educated Patient   Methods Explanation;Demonstration;Tactile cues;Verbal cues   Comprehension Verbalized understanding;Returned demonstration;Verbal cues required             PT Long Term  Goals - 06/20/15 2000    PT LONG TERM GOAL #1   Title Patient will demonstrate improved function with daily activties with decreased pain with Quick Dash score of 35% by 07/18/2015   Baseline QuickDash 50% impairment   Status New   PT LONG TERM GOAL #2   Title Patient will have 120 degrees of active forward elevation and 45 degrees ER by 07/18/2015 for improved functional use right shoulder/UE for personal care and work related tasks above shoulder   Baseline AROM right shoulder 80 degrees with hiking right shoulder (supine lying 100 degrees), ER 20 degrees   Status New   PT LONG TERM GOAL #3   Title Patient will be independent with home program for self management of pain and exercise for ROM and strengthening right UE/shoulder by 07/18/2015 in order to continue to progress towards full functional use right UE and prior level of funciton    Baseline limited knowledge of appropriate exercise and progression in order to retur to full functional use wihtout difficulty/limitaitons   Status New               Plan - 06/29/15 1959    Clinical Impression Statement Paitent is progressing well with improved ROM to 110 degrees forward elevation right shoulder and decreased stiffness reported in shoulder following treatment. She continues with primary limitaitons of stiffness in shoulder joint and weakness in right UE and will benefit from continued physical therapy in order to return to full function without limitations.    Pt will benefit from skilled therapeutic intervention in order to improve on the following deficits Decreased strength;Pain;Impaired UE functional use;Increased muscle spasms;Decreased range of motion   Rehab Potential Good   PT Frequency 2x / week   PT Duration 4 weeks   PT Treatment/Interventions Manual techniques;Neuromuscular re-education;Patient/family education;Cryotherapy;Therapeutic exercise;Moist Heat;Electrical Stimulation   PT Next Visit Plan manual therapy,  progressive exericse for strength and ROM    PT Home Exercise Plan pendulums, pulleys, AAROM, scapular control, posture correction throughout the day        Problem List Patient Active Problem List   Diagnosis Date Noted  . Closed fracture of part of upper end of humerus 06/18/2015  . Shingles 02/13/2015  . Iron deficiency anemia 12/29/2013  . Essential hypertension 12/29/2013  . Plantar fasciitis 12/29/2013  . Overweight 12/29/2013    Beacher May PT 06/30/2015, 6:12 PM  [facility] [address] [place], [address] Phone: [phone]   Fax:  [phone]  Name: [name] MRN: [id number] Date of Birth: 07-16-1965

## 2015-06-30 NOTE — Progress Notes (Signed)
   Subjective:  Patient ID: [name], female    DOB: 02-03-1966  Age: 50 y.o. MRN: [id number]  CC: Left eyelid itching, itchy eye  HPI:  50 year old female presents with the above complaints.  Left eyelid itching, Left eye itching  Began around thanksgiving.  Has continued to persist.  Eye lid is dry, itchy.  She reports associated itchiness of the eye.   Also reports matting in the am and mild swelling.  No known new exposures.  She has used antibiotic ointment and hydrocortisone with no improvement.  She has had some relief with warm/cold compress.  No vision changes.  No other complaints.  No known exacerbating factors.   Social Hx   Social History   Social History  . Marital Status: Married    Spouse Name: N/A  . Number of Children: 2  . Years of Education: 18   Occupational History  . High School Teacher    Social History Main Topics  . Smoking status: Never Smoker   . Smokeless tobacco: None  . Alcohol Use: 3.6 oz/week    6 Glasses of wine per week  . Drug Use: No  . Sexual Activity: Not Asked   Other Topics Concern  . None   Social History Narrative   Review of Systems  Constitutional: Negative.   Eyes: Positive for itching. Negative for visual disturbance.   Objective:  BP 122/74 mmHg  Pulse 72  Temp(Src) 98.2 F (36.8 C) (Oral)  Ht 5' 7.5" (1.715 m)  Wt 200 lb 4 oz (90.833 kg)  BMI 30.88 kg/m2  SpO2 99%  BP/Weight 06/30/2015 02/13/2015 12/09/2014  Systolic BP 122 136 118  Diastolic BP 74 70 80  Wt. (Lbs) 200.25 205.6 201  BMI 30.88 31.27 30.57   Physical Exam  Constitutional: She appears well-developed. No distress.  Eyes:  Left eye - normal conjunctiva. No drainage. Mild skin dryness noted.  Pulmonary/Chest: Effort normal.  Neurological: She is alert.  Psychiatric: She has a normal mood and affect.  Vitals reviewed.   Lab Results  Component Value Date   WBC 9.0 01/05/2014   HGB 10.7* 01/05/2014   HCT 34.3*  01/05/2014   PLT 315.0 01/05/2014   GLUCOSE 81 12/29/2013   CHOL 159 12/29/2007   TRIG 95 12/29/2007   HDL 36* 12/29/2007   LDLCALC 104* 12/29/2007   ALT 30 12/29/2007   AST 28 12/29/2007   NA 136 12/29/2013   K 4.0 12/29/2013   CL 105 12/29/2013   CREATININE 0.6 12/29/2013   BUN 9 12/29/2013   CO2 25 12/29/2013    Assessment & Plan:   Problem List Items Addressed This Visit    Dermatitis of left eyelid - Primary    New problem. Unclear etiology. Treating with topical desonide. Cautioned about skin lightening and thinning. Advised very brief use. Also giving pataday for eye itching.         Meds ordered this encounter  Medications  . Olopatadine HCl 0.2 % SOLN    Sig: 1 drop to affected eye daily.    Dispense:  2.5 mL    Refill:  0  . desonide (DESOWEN) 0.05 % ointment    Sig: Apply 1 application topically daily. Do not use more than 3-5 days.    Dispense:  15 g    Refill:  0    Follow-up: No Follow-up on file.  Everlene Other DO [facility]

## 2015-07-03 ENCOUNTER — Encounter: Payer: Self-pay | Admitting: Physical Therapy

## 2015-07-03 ENCOUNTER — Ambulatory Visit: Payer: BC Managed Care – PPO | Admitting: Physical Therapy

## 2015-07-03 DIAGNOSIS — M6281 Muscle weakness (generalized): Secondary | ICD-10-CM

## 2015-07-03 DIAGNOSIS — M25611 Stiffness of right shoulder, not elsewhere classified: Secondary | ICD-10-CM | POA: Diagnosis not present

## 2015-07-03 DIAGNOSIS — M25511 Pain in right shoulder: Secondary | ICD-10-CM

## 2015-07-04 NOTE — Therapy (Signed)
[facility]TS [facility] [address], [place], [address] Phone: [phone]   Fax:  [phone]  Physical Therapy Treatment  Patient Details  Name: SHANTRICE RODENBERG MRN: [id number] Date of Birth: May 03, 1966 Referring Provider: Myra Rude MD  Encounter Date: 07/03/2015      PT End of Session - 07/03/15 1900    Visit Number 4   Number of Visits 8   Date for PT Re-Evaluation 07/18/15   PT Start Time 1810   PT Stop Time 1855   PT Time Calculation (min) 45 min   Activity Tolerance Patient tolerated treatment well   Behavior During Therapy [facility] for tasks assessed/performed      Past Medical History  Diagnosis Date  . Depression   . Heart murmur   . Hypertension   . Anemia   . History of genital warts   . History of chicken pox   . H/O scarlet fever     Past Surgical History  Procedure Laterality Date  . Abdominal hysterectomy  2005  . Knee surgery Left     2015    There were no vitals filed for this visit.  Visit Diagnosis:  Shoulder stiffness, right  Pain in right shoulder  Muscle weakness of right upper extremity      Subjective Assessment - 07/03/15 1811    Subjective Patient is progressing with exercise and working on strengthening and not raising arm above shoulder level.    Limitations Lifting;House hold activities   Patient Stated Goals to improved ROM and strength right shoulder/UE back to normal to return to prior level of funciton   Currently in Pain? Yes   Pain Score 4    Pain Location Shoulder   Pain Orientation Right   Pain Descriptors / Indicators Aching;Tightness   Pain Type Acute pain   Pain Onset More than a month ago  04/14/2015   Pain Frequency Intermittent        Objective:  Posture: right shoulder more relaxed without obvious hiking of shoulder in standing Palpation: + spasms along cervical spine right, upper trapezius right, pectoral muscles and lats.  right AROM: right shoulder in sitting pre treatment 0-110 degrees with mild hiking of shoulder       [facility] Adult PT Treatment/Exercise - 07/03/15 1816    Exercises   Exercises Other Exercises patient performed exercises with guidance, verbal and tactile cues and demonstration of PT: AAROM right shoulder forward flexion in supine lying, rhythmic stabilization performed with light resistance x 10 reps,  AAROM supine lying while holding onto 3# weight for AAROM/strengthening 3 x 5 reps with gradual increasing ROM, side lying shoulder elevation/depression and protraction/retraction with scapular control and tactile cues x 10 reps, side lying ER while holding 2# weight with controlled motion (towel under arm), AAROM flexion in side lying 2 x 10 reps with fatigue   Manual Therapy   Manual Therapy Soft tissue mobilization   Soft tissue mobilization right shoulder soft tissue mobilization to right upper trapezius, cervical spine, pectoral muscles, lats. upper trapezius release with patient side lying left 3 x 10-20 seconds, scapular mobilization with lateral glides, rotation forward and back x 10 reps       Patient response to treatment: improved soft tissue elasticity with improved AAROM forward elevation to 115 degrees with less stiffness in shoulder; decreased pain to <2/10 following session; patient demonstrated improved technique with decreased hiking of right shoulder with all exercises with verbal cues, tactile cues and  repetition, improved motor control noted with all exercises with cuing of therapist           PT Education - 07/03/15 1830    Education provided Yes   Education Details HEP: re assessed shouler forward flexion supine and side lying   Person(s) Educated Patient   Methods Explanation;Demonstration;Tactile cues;Verbal cues   Comprehension Verbalized understanding;Returned demonstration;Verbal cues required             PT Long Term Goals - 06/20/15 2000    PT LONG  TERM GOAL #1   Title Patient will demonstrate improved function with daily activties with decreased pain with Quick Dash score of 35% by 07/18/2015   Baseline QuickDash 50% impairment   Status New   PT LONG TERM GOAL #2   Title Patient will have 120 degrees of active forward elevation and 45 degrees ER by 07/18/2015 for improved functional use right shoulder/UE for personal care and work related tasks above shoulder   Baseline AROM right shoulder 80 degrees with hiking right shoulder (supine lying 100 degrees), ER 20 degrees   Status New   PT LONG TERM GOAL #3   Title Patient will be independent with home program for self management of pain and exercise for ROM and strengthening right UE/shoulder by 07/18/2015 in order to continue to progress towards full functional use right UE and prior level of funciton    Baseline limited knowledge of appropriate exercise and progression in order to retur to full functional use wihtout difficulty/limitaitons   Status New               Plan - 07/03/15 1900    Clinical Impression Statement Patient is progressing well with improved ROM in right shoulder forward elevation to 115 degrees with less difficulty. She continues with weakness in right shoulder/UE and requires assistance to perform appropriate progression of exercises.    Pt will benefit from skilled therapeutic intervention in order to improve on the following deficits Decreased strength;Pain;Impaired UE functional use;Increased muscle spasms;Decreased range of motion   Rehab Potential Good   PT Frequency 2x / week   PT Duration 4 weeks   PT Treatment/Interventions Manual techniques;Neuromuscular re-education;Patient/family education;Cryotherapy;Therapeutic exercise;Moist Heat;Electrical Stimulation   PT Next Visit Plan manual therapy, progressive exericse for strength and ROM    PT Home Exercise Plan AAROM, strengthening right shoulder with light weights,         Problem List Patient  Active Problem List   Diagnosis Date Noted  . Dermatitis of left eyelid 06/30/2015  . Closed fracture of part of upper end of humerus 06/18/2015  . Shingles 02/13/2015  . Iron deficiency anemia 12/29/2013  . Essential hypertension 12/29/2013  . Plantar fasciitis 12/29/2013  . Overweight 12/29/2013    Beacher May PT 07/04/2015, 5:09 PM  [facility] PHYSICAL AND SPORTS MEDICINE [address], [place], [address] Phone: [phone]   Fax:  [phone]  Name: JAYCIE KREGEL MRN: [id number] Date of Birth: July 12, 1965

## 2015-07-06 ENCOUNTER — Ambulatory Visit: Payer: BC Managed Care – PPO | Admitting: Physical Therapy

## 2015-07-06 ENCOUNTER — Encounter: Payer: Self-pay | Admitting: Physical Therapy

## 2015-07-06 DIAGNOSIS — M6281 Muscle weakness (generalized): Secondary | ICD-10-CM

## 2015-07-06 DIAGNOSIS — M25611 Stiffness of right shoulder, not elsewhere classified: Secondary | ICD-10-CM

## 2015-07-06 DIAGNOSIS — M25511 Pain in right shoulder: Secondary | ICD-10-CM

## 2015-07-07 NOTE — Therapy (Signed)
[facility] [address] [place], [address] Phone: [phone]   Fax:  [phone]  Physical Therapy Treatment  Patient Details  Name: [name] MRN: [id number] Date of Birth: [birth year] Referring Provider: Myra Rude MD  Encounter Date: 07/06/2015      PT End of Session - 07/06/15 1912    Visit Number 5   Number of Visits 8   Date for PT Re-Evaluation 07/18/15   PT Start Time 1905   PT Stop Time 1950   PT Time Calculation (min) 45 min   Activity Tolerance Patient tolerated treatment well   Behavior During Therapy [facility] for tasks assessed/performed      Past Medical History  Diagnosis Date  . Depression   . Heart murmur   . Hypertension   . Anemia   . History of genital warts   . History of chicken pox   . H/O scarlet fever     Past Surgical History  Procedure Laterality Date  . Abdominal hysterectomy  2005  . Knee surgery Left     2015    There were no vitals filed for this visit.  Visit Diagnosis:  Shoulder stiffness, right  Muscle weakness of right upper extremity  Pain in right shoulder      Subjective Assessment - 07/06/15 1907    Subjective Patient is progressing with exercise and working on strengthening and not raising arm above shoulder level. She reports she has not been exercising as much as she would like but is using it more throughout the day.   Limitations Lifting;House hold activities   Patient Stated Goals to improved ROM and strength right shoulder/UE back to normal to return to prior level of funciton   Currently in Pain? Yes   Pain Score 2    Pain Location Shoulder   Pain Orientation Right   Pain Descriptors / Indicators Aching;Tightness   Pain Type Acute pain   Pain Onset More than a month ago  04/14/2015   Pain Frequency Intermittent      Objective:  Posture: right shoulder more relaxed without obvious hiking of shoulder in  standing Palpation: + spasms along cervical spine right, upper trapezius right, pectoral muscles and lats. right AROM: right shoulder in sitting pre treatment 0-100 degrees with mild hiking of shoulder (tired and at end of work day today)         [facility] Adult PT Treatment/Exercise - 07/06/15 1911    Exercises   Exercises Other Exercises patient performed exercises with guidance, verbal and tactile cues and demonstration of PT: AAROM right shoulder forward flexion in supine lying rhythmic stabilization performed with light resistance x 10 reps,  AAROM supine lying while holding onto 3# weight for AAROM/strengthening 3 x 5 reps with gradual increasing ROM,  side lying shoulder elevation/depression and protraction/retraction with scapular control and tactile cues x 10 reps,  side lying ER with controlled motion (towel under arm), AAROM flexion in side lying 2 x 10 reps with assistance of PT with mild fatigue   Manual Therapy   Manual Therapy Soft tissue mobilization   Soft tissue mobilization right shoulder soft tissue mobilization superficial techniques performed by therapist to right upper trapezius, cervical spine, pectoral muscles, lats. With patient supine and side lying upper trapezius release with patient side lying left 3 x 10-20 seconds, scapular mobilization with lateral glides, rotation forward and back x 10 reps       Patient response to treatment:  improved soft tissue elasticity with improved AAROM forward elevation to 105 degrees with less stiffness in shoulder (limited today due to increased stiffness); decreased pain to <1/10 following session; patient demonstrated improved technique with decreased hiking of right shoulder with all exercises with verbal cues, tactile cues and repetition, improved motor control noted with all exercises with cuing of therapist   PT education; re assessed HEP for AAROM and pendulums, patient verbalized understanding       PT Long Term Goals -  06/20/15 2000    PT LONG TERM GOAL #1   Title Patient will demonstrate improved function with daily activties with decreased pain with Quick Dash score of 35% by 07/18/2015   Baseline QuickDash 50% impairment   Status New   PT LONG TERM GOAL #2   Title Patient will have 120 degrees of active forward elevation and 45 degrees ER by 07/18/2015 for improved functional use right shoulder/UE for personal care and work related tasks above shoulder   Baseline AROM right shoulder 80 degrees with hiking right shoulder (supine lying 100 degrees), ER 20 degrees   Status New   PT LONG TERM GOAL #3   Title Patient will be independent with home program for self management of pain and exercise for ROM and strengthening right UE/shoulder by 07/18/2015 in order to continue to progress towards full functional use right UE and prior level of funciton    Baseline limited knowledge of appropriate exercise and progression in order to retur to full functional use wihtout difficulty/limitaitons   Status New               Plan - 07/06/15 1955    Clinical Impression Statement Patient is progressing well with improved ROM in right shoulder forward elevaiton to up to 115 degrees (supine with assistance). She continues with weakness in right shoulder and spasms/shortened muscles and requires assistance to perform apporopriate progression of exercise.    Pt will benefit from skilled therapeutic intervention in order to improve on the following deficits Decreased strength;Pain;Impaired UE functional use;Increased muscle spasms;Decreased range of motion   Rehab Potential Good   PT Frequency 2x / week   PT Duration 4 weeks   PT Next Visit Plan manual therapy, progressive exericse for strength and ROM    PT Home Exercise Plan AAROM, strengthening right shoulder with light weights,         Problem List Patient Active Problem List   Diagnosis Date Noted  . Dermatitis of left eyelid 06/30/2015  . Closed fracture of  part of upper end of humerus 06/18/2015  . Shingles 02/13/2015  . Iron deficiency anemia 12/29/2013  . Essential hypertension 12/29/2013  . Plantar fasciitis 12/29/2013  . Overweight 12/29/2013    Beacher May PT 07/07/2015, 8:56 AM  [facility] PHYSICAL AND SPORTS MEDICINE [address] [place], [address] Phone: [phone]   Fax:  [phone]  Name: [name] MRN: [id number] Date of Birth: Oct 19, 1965

## 2015-07-11 ENCOUNTER — Ambulatory Visit: Payer: BC Managed Care – PPO | Admitting: Physical Therapy

## 2015-07-11 ENCOUNTER — Encounter: Payer: Self-pay | Admitting: Physical Therapy

## 2015-07-11 DIAGNOSIS — M6281 Muscle weakness (generalized): Secondary | ICD-10-CM

## 2015-07-11 DIAGNOSIS — M25611 Stiffness of right shoulder, not elsewhere classified: Secondary | ICD-10-CM

## 2015-07-11 NOTE — Therapy (Signed)
[facility] [address] [place], [address] Phone: [phone]   Fax:  [phone]  Physical Therapy Treatment  Patient Details  Name: [name] MRN: [id number] Date of Birth: Dec 12, 1965 Referring Provider: Myra Rude MD  Encounter Date: 07/11/2015      PT End of Session - 07/11/15 1900    Visit Number 6   Number of Visits 8   Date for PT Re-Evaluation 07/18/15   PT Start Time 1809   PT Stop Time 1850   PT Time Calculation (min) 41 min   Activity Tolerance Patient tolerated treatment well   Behavior During Therapy [facility] for tasks assessed/performed      Past Medical History  Diagnosis Date  . Depression   . Heart murmur   . Hypertension   . Anemia   . History of genital warts   . History of chicken pox   . H/O scarlet fever     Past Surgical History  Procedure Laterality Date  . Abdominal hysterectomy  2005  . Knee surgery Left     2015    There were no vitals filed for this visit.  Visit Diagnosis:  Shoulder stiffness, right  Muscle weakness of right upper extremity      Subjective Assessment - 07/11/15 1812    Subjective Patient saw MD and is healed completely and is now able to perform all exercises and tasks that she can.    Limitations Lifting;House hold activities   Patient Stated Goals to improved ROM and strength right shoulder/UE back to normal to return to prior level of funciton   Currently in Pain? Yes   Pain Score 2    Pain Location Shoulder   Pain Orientation Right   Pain Descriptors / Indicators Aching;Tightness   Pain Type Acute pain   Pain Onset More than a month ago  04/14/2015       Objective: AROM: right shoulder seated pre treatment forward elevation 0-95; AAROM: 105 Palpation:  Capsular mobility:        [facility] Adult PT Treatment/Exercise - 07/11/15 1817    Exercises   Exercises Other Exercises patient performed exercises with  guidance, verbal and tactile cues and demonstration of PT: Supine lying: AAROM guided by therapist abduction, forward elevation, ER, rhythmic stabilization performed with light resistance x 10 reps with right shoulder @ 90 degrees elevation     Manual Therapy   Manual Therapy Soft tissue mobilization; joint mobilization   Soft tissue mobilization right shoulder: GHJ  Mobilization lateral distraction grade 1-3, inferior glide multiple sets/reps grade 2-3 followed by PROM/AAROM into forward elevation, STM performed to right shoulder/upper trapezius and subscapularis, pectoral muscles      Patient response to treatment: improved AROM to 105 degrees in sitting and to 115 degrees forward elevation in supine lying following treatment, improved mobility in GHJ allowing increased ROM, patient demonstrated good technique with self mobilizationn of shoulder following demonstration            PT Long Term Goals - 06/20/15 2000    PT LONG TERM GOAL #1   Title Patient will demonstrate improved function with daily activties with decreased pain with Quick Dash score of 35% by 07/18/2015   Baseline QuickDash 50% impairment   Status New   PT LONG TERM GOAL #2   Title Patient will have 120 degrees of active forward elevation and 45 degrees ER by 07/18/2015 for improved functional use right shoulder/UE for personal care  and work related tasks above shoulder   Baseline AROM right shoulder 80 degrees with hiking right shoulder (supine lying 100 degrees), ER 20 degrees   Status New   PT LONG TERM GOAL #3   Title Patient will be independent with home program for self management of pain and exercise for ROM and strengthening right UE/shoulder by 07/18/2015 in order to continue to progress towards full functional use right UE and prior level of funciton    Baseline limited knowledge of appropriate exercise and progression in order to retur to full functional use wihtout difficulty/limitaitons   Status New                Plan - 07/11/15 1855    Clinical Impression Statement Paitent demonstrated improvement with ROM by 10 degrees into forward elevation with treatment. She continues with limited ROM due to capsular restrictions and weakness and will benefit from additional physical therapy intervention to achieve goals.    Pt will benefit from skilled therapeutic intervention in order to improve on the following deficits Decreased strength;Pain;Impaired UE functional use;Increased muscle spasms;Decreased range of motion   Rehab Potential Good   PT Frequency 2x / week   PT Duration 4 weeks   PT Treatment/Interventions Manual techniques;Neuromuscular re-education;Patient/family education;Cryotherapy;Therapeutic exercise;Moist Heat;Electrical Stimulation   PT Next Visit Plan manual therapy, progressive exericse for strength and ROM         Problem List Patient Active Problem List   Diagnosis Date Noted  . Dermatitis of left eyelid 06/30/2015  . Closed fracture of part of upper end of humerus 06/18/2015  . Shingles 02/13/2015  . Iron deficiency anemia 12/29/2013  . Essential hypertension 12/29/2013  . Plantar fasciitis 12/29/2013  . Overweight 12/29/2013    Beacher May PT 07/12/2015, 5:23 PM  [facility] PHYSICAL AND SPORTS MEDICINE [address] [place], [address] Phone: [phone]   Fax:  [phone]  Name: [name] MRN: [id number] Date of Birth: 03/06/1966

## 2015-07-13 ENCOUNTER — Ambulatory Visit: Payer: BC Managed Care – PPO | Admitting: Physical Therapy

## 2015-07-13 ENCOUNTER — Encounter: Payer: Self-pay | Admitting: Physical Therapy

## 2015-07-13 DIAGNOSIS — M25611 Stiffness of right shoulder, not elsewhere classified: Secondary | ICD-10-CM

## 2015-07-13 DIAGNOSIS — M6281 Muscle weakness (generalized): Secondary | ICD-10-CM

## 2015-07-13 DIAGNOSIS — M25511 Pain in right shoulder: Secondary | ICD-10-CM

## 2015-07-13 NOTE — Therapy (Signed)
[name] [address] [place], [address] Phone: [phone]   Fax:  [phone]  Physical Therapy Treatment  Patient Details  Name: [name] MRN: [id number] Date of Birth: 02-Jul-1965 Referring Provider: Myra Rude MD  Encounter Date: 07/13/2015      PT End of Session - 07/13/15 1921    Visit Number 7   Number of Visits 8   Date for PT Re-Evaluation 07/18/15   PT Start Time 1840   PT Stop Time 1911   PT Time Calculation (min) 31 min   Activity Tolerance Patient tolerated treatment well   Behavior During Therapy [facility] for tasks assessed/performed      Past Medical History  Diagnosis Date  . Depression   . Heart murmur   . Hypertension   . Anemia   . History of genital warts   . History of chicken pox   . H/O scarlet fever     Past Surgical History  Procedure Laterality Date  . Abdominal hysterectomy  2005  . Knee surgery Left     2015    There were no vitals filed for this visit.  Visit Diagnosis:  Shoulder stiffness, right  Muscle weakness of right upper extremity  Pain in right shoulder      Subjective Assessment - 07/13/15 1845    Subjective Patient reports she is "much better" since previous session. She is able to raise her arm further and is exercising as instructed at home. She is still having difficulty with putting on deodorant on under right arm.    Patient is accompained by: Family member   Limitations Lifting;House hold activities   How long can you walk comfortably? "    Patient Stated Goals to improved ROM and strength right shoulder/UE back to normal to return to prior level of funciton   Currently in Pain? Yes   Pain Score 2    Pain Location Shoulder   Pain Orientation Right   Pain Descriptors / Indicators Aching;Tightness   Pain Type Acute pain   Pain Onset More than a month ago  04/14/2015   Pain Frequency Intermittent        Objective; Sitting AAROM right shoulder flexion: 120 Palpation: decreased soft tissue elasticity, mobility pectoral muscles, upper trapezius, subscapularis right shoulder       [facility] Adult PT Treatment/Exercise - 07/13/15 2145    Exercises   Exercises Other Exercises   Other Exercises  patient performed exercises with guidance, verbal and tactile cues of PT: supine lying: AAROM/PROM right UE forward elevation, ER, IR and abduction in conjunction with joint and soft tissue mobilization   Manual Therapy   Manual Therapy Soft tissue mobilization;Joint mobilization   Manual therapy comments decreased soft tissue mobility right pectoral muscle, subscapularis, lats and upper trapezius   Joint Mobilization GHJ lateral distaction with snterior/posterior glides and caudal glides grades 2-3, multiple sets in conjunction with AAROM/PROM exercises   Soft tissue mobilization right shoulder soft tissue mobilizaiton pectoral muscle, subscapularis, lats and upper trapezius, myofascial arm pull right UE (patient supine lying)       Patient response to treatment: improved flexibility in right shoulder and soft tissue elasticity allowing improved AAROM with less pain and difficulty          PT Education - 07/13/15 1920    Education provided Yes   Education Details HEP re assessed for ROM and self mobilization   Person(s) Educated Patient   Methods Explanation  Comprehension Verbalized understanding             PT Long Term Goals - 06/20/15 2000    PT LONG TERM GOAL #1   Title Patient will demonstrate improved function with daily activties with decreased pain with Quick Dash score of 35% by 07/18/2015   Baseline QuickDash 50% impairment   Status New   PT LONG TERM GOAL #2   Title Patient will have 120 degrees of active forward elevation and 45 degrees ER by 07/18/2015 for improved functional use right shoulder/UE for personal care and work related tasks above shoulder   Baseline AROM right  shoulder 80 degrees with hiking right shoulder (supine lying 100 degrees), ER 20 degrees   Status New   PT LONG TERM GOAL #3   Title Patient will be independent with home program for self management of pain and exercise for ROM and strengthening right UE/shoulder by 07/18/2015 in order to continue to progress towards full functional use right UE and prior level of funciton    Baseline limited knowledge of appropriate exercise and progression in order to retur to full functional use wihtout difficulty/limitaitons   Status New               Plan - 07/13/15 1922    Clinical Impression Statement Patient demonstrates improving AROM to 120 degrees forward elevation and demonstrates functional carryover from session to session. She conitnues with decreased AROM and strength in right UE and will benefit from additional physical therapy intervention to achieve goals.    Pt will benefit from skilled therapeutic intervention in order to improve on the following deficits Decreased strength;Pain;Impaired UE functional use;Increased muscle spasms;Decreased range of motion   Rehab Potential Good   PT Frequency 2x / week   PT Duration 4 weeks   PT Next Visit Plan manual therapy, progressive exericse for strength and ROM    PT Home Exercise Plan AAROM, strengthening right shoulder with light weights, self mobilization         Problem List Patient Active Problem List   Diagnosis Date Noted  . Dermatitis of left eyelid 06/30/2015  . Closed fracture of part of upper end of humerus 06/18/2015  . Shingles 02/13/2015  . Iron deficiency anemia 12/29/2013  . Essential hypertension 12/29/2013  . Plantar fasciitis 12/29/2013  . Overweight 12/29/2013    Beacher May PT 07/13/2015, 10:18 PM  [facility] PHYSICAL AND SPORTS MEDICINE [address] [place], [address] Phone: [phone]   Fax:  [phone]  Name: [name] MRN: [id number] Date of  Birth: [birth year]

## 2015-07-18 ENCOUNTER — Encounter: Payer: Self-pay | Admitting: Physical Therapy

## 2015-07-18 ENCOUNTER — Ambulatory Visit: Payer: BC Managed Care – PPO | Admitting: Physical Therapy

## 2015-07-18 DIAGNOSIS — M6281 Muscle weakness (generalized): Secondary | ICD-10-CM

## 2015-07-18 DIAGNOSIS — M25611 Stiffness of right shoulder, not elsewhere classified: Secondary | ICD-10-CM

## 2015-07-18 DIAGNOSIS — M25511 Pain in right shoulder: Secondary | ICD-10-CM

## 2015-07-18 NOTE — Therapy (Signed)
[facility] [address] [place], [address] Phone: [phone]   Fax:  [phone]  Physical Therapy Treatment  Patient Details  Name: JANAE BONSER MRN: [id number] Date of Birth: [birth year] Referring Provider: Myra Rude MD  Encounter Date: 07/18/2015      PT End of Session - 07/18/15 1831    Visit Number 8   Number of Visits 8   Date for PT Re-Evaluation 07/18/15   PT Start Time 1826   PT Stop Time 1905   PT Time Calculation (min) 39 min   Activity Tolerance Patient tolerated treatment well   Behavior During Therapy [facility] for tasks assessed/performed      Past Medical History  Diagnosis Date  . Depression   . Heart murmur   . Hypertension   . Anemia   . History of genital warts   . History of chicken pox   . H/O scarlet fever     Past Surgical History  Procedure Laterality Date  . Abdominal hysterectomy  2005  . Knee surgery Left     2015    There were no vitals filed for this visit.  Visit Diagnosis:  Shoulder stiffness, right  Muscle weakness of right upper extremity  Pain in right shoulder      Subjective Assessment - 07/18/15 1827    Subjective Patient reports she is feeling popping in her right shoulder and she is having soreness in her shoulder/UE more as the day progresses. She feels she is improving with moving and strength in right UE and is noticing less stiffness and more strength and feels therapy is benefiting her greatly.    Limitations Lifting;House hold activities   Patient Stated Goals to improved ROM and strength right shoulder/UE back to normal to return to prior level of funciton   Currently in Pain? Yes   Pain Score 2    Pain Location Shoulder   Pain Descriptors / Indicators Aching;Sore;Tightness   Pain Type Acute pain   Pain Onset More than a month ago  04/13/2106   Pain Frequency Intermittent      Objective; AROM: right shoulder forward  elevation in sitting 110, supine lying AAROM 140 degrees  Palpation: decreased soft tissue mobility right shoulder lats, pectoral muscles, subscapularis Joint mobility: right shoulder decreased inferior glide GHJ right shoulder        [facility] Adult PT Treatment/Exercise - 07/18/15 1830    Exercises   Exercises Other Exercises   Other Exercises  patient performed exercises with guidance, verbal and tactile cues and demonstration of PT: supine lying: AAROM/PROM right UE forward elevation, ER, IR and abduction; abduction in side lying with using left UE for assist; all in conjunction with joint and soft tissue mobilization   Manual Therapy   Manual Therapy Soft tissue mobilization;Joint mobilization   Manual therapy comments decreased soft tissue mobility right pectoral muscle, subscapularis, lats and upper trapezius   Joint Mobilization GHJ lateral distaction with snterior/posterior glides and caudal glides grades 2-3    Soft tissue mobilization right shoulder soft tissue mobilizaiton pectoral muscle, subscapularis, lats and upper trapezius, myofascial arm pull right UE      Patient response to treatment: improved flexibility in right shoulder and soft tissue elasticity allowing improved AAROM with less pain and difficulty, able to perform side lying abduction with less pain, difficulty with short lever and assistance           PT Education - 07/18/15 2049  Education provided Yes   Education Details HEP; added side lying shoulder depression, abduction with short lever arm and assistance of left UE   Person(s) Educated Patient   Methods Explanation;Demonstration;Tactile cues;Verbal cues   Comprehension Verbalized understanding;Returned demonstration;Verbal cues required             PT Long Term Goals - 06/20/15 2000    PT LONG TERM GOAL #1   Title Patient will demonstrate improved function with daily activties with decreased pain with Quick Dash score of 35% by 07/18/2015    Baseline QuickDash 50% impairment   Status New   PT LONG TERM GOAL #2   Title Patient will have 120 degrees of active forward elevation and 45 degrees ER by 07/18/2015 for improved functional use right shoulder/UE for personal care and work related tasks above shoulder   Baseline AROM right shoulder 80 degrees with hiking right shoulder (supine lying 100 degrees), ER 20 degrees   Status New   PT LONG TERM GOAL #3   Title Patient will be independent with home program for self management of pain and exercise for ROM and strengthening right UE/shoulder by 07/18/2015 in order to continue to progress towards full functional use right UE and prior level of funciton    Baseline limited knowledge of appropriate exercise and progression in order to retur to full functional use wihtout difficulty/limitaitons   Status New               Plan - 07/18/15 2050    Clinical Impression Statement Patient is progressing well with improved forward elevation AAROM to 140 and active sitting forward elevation to 110 degrees. She demonstrates improving strength and endurance and is having good carry over into functional use throughout the day. She continues with stiffness and limited soft tissue mobility and strength and continues to benefit from skilled physical therapy intervention to achieve goals.    Pt will benefit from skilled therapeutic intervention in order to improve on the following deficits Decreased strength;Pain;Impaired UE functional use;Increased muscle spasms;Decreased range of motion   Rehab Potential Good   PT Frequency 2x / week   PT Duration 4 weeks   PT Treatment/Interventions Manual techniques;Neuromuscular re-education;Patient/family education;Cryotherapy;Therapeutic exercise;Moist Heat;Electrical Stimulation   PT Next Visit Plan manual therapy, progressive exericse for strength and ROM; reassess quickDash, ROM, strength, recertification    PT Home Exercise Plan AAROM, strengthening right  shoulder with light weights, self mobilization, side lying abduciton with assistance        Problem List Patient Active Problem List   Diagnosis Date Noted  . Dermatitis of left eyelid 06/30/2015  . Closed fracture of part of upper end of humerus 06/18/2015  . Shingles 02/13/2015  . Iron deficiency anemia 12/29/2013  . Essential hypertension 12/29/2013  . Plantar fasciitis 12/29/2013  . Overweight 12/29/2013    Carl Best 07/18/2015, 8:54 PM  [facility] PH[facility]TS MEDICINE [address], [place], [address] Phone: [phone]   Fax:  [phone]  Name: SHIRLY BARTOSIEWICZ MRN: [id number] Date of Birth: Sep 20, 1965

## 2015-07-20 ENCOUNTER — Encounter: Payer: Self-pay | Admitting: Physical Therapy

## 2015-07-20 ENCOUNTER — Ambulatory Visit: Payer: BC Managed Care – PPO | Attending: Specialist | Admitting: Physical Therapy

## 2015-07-20 DIAGNOSIS — M6281 Muscle weakness (generalized): Secondary | ICD-10-CM | POA: Diagnosis present

## 2015-07-20 DIAGNOSIS — M25511 Pain in right shoulder: Secondary | ICD-10-CM

## 2015-07-20 DIAGNOSIS — M25611 Stiffness of right shoulder, not elsewhere classified: Secondary | ICD-10-CM | POA: Insufficient documentation

## 2015-07-21 NOTE — Therapy (Signed)
[facility] [address] [place], [address] Phone: [phone]   Fax:  [phone]  Physical Therapy Treatment  Patient Details  Name: [name] MRN: [id number] Date of Birth: 14-May-1966 Referring Provider: Christophe Louis MD  Encounter Date: 07/20/2015      PT End of Session - 07/20/15 1910    Visit Number 9   Number of Visits 16   Date for PT Re-Evaluation 08/18/15   PT Start Time 5188   PT Stop Time 1908   PT Time Calculation (min) 38 min   Activity Tolerance Patient tolerated treatment well   Behavior During Therapy [facility] for tasks assessed/performed      Past Medical History  Diagnosis Date  . Depression   . Heart murmur   . Hypertension   . Anemia   . History of genital warts   . History of chicken pox   . H/O scarlet fever     Past Surgical History  Procedure Laterality Date  . Abdominal hysterectomy  2005  . Knee surgery Left     2015    There were no vitals filed for this visit.  Visit Diagnosis:  Shoulder stiffness, right - Plan: PT plan of care cert/re-cert  Muscle weakness of right upper extremity - Plan: PT plan of care cert/re-cert  Pain in right shoulder - Plan: PT plan of care cert/re-cert      Subjective Assessment - 07/20/15 1832    Subjective Patient reports she continues with feeling popping in her right shoulder and she is having soreness in her shoulder/UE more as the day progresses. She is more tired and stiff today due to being end of the week. She is better and less stiff after the weekend.     Patient Stated Goals to improved ROM and strength right shoulder/UE back to normal to return to prior level of funciton   Currently in Pain? Yes   Pain Score 2    Pain Location Shoulder   Pain Orientation Right   Pain Descriptors / Indicators Aching;Sore;Tightness   Pain Type Acute pain   Pain Onset More than a month ago  04/13/2016   Pain Frequency  Intermittent      Objective:   sitting AROM right shoulder on arrival 90 with soreness and stiffness reported,  Palpation: soft tissue mobility decreased right shoulder, anterior, inferior, joint mobility decreased inferior glide GHJ capsule right shoulder       [facility] Adult PT Treatment/Exercise - 07/20/15 1835    Exercises   Exercises Other Exercises   Other Exercises  patient performed exercises with guidance, verbal and tactile cues and demonstration of PT: supine lying: AAROM/PROM right UE forward elevation, ER, IR and abduction; abduction in side lying with using left UE for assist; all in conjunction with joint and soft tissue mobilization   Manual Therapy   Manual Therapy Soft tissue mobilization;Joint mobilization   Manual therapy comments decreased soft tissue mobility right pectoral muscle, subscapularis, lats and upper trapezius   Joint Mobilization GHJ lateral distaction with anterior/posterior glides and caudal glides grades 2-3 with patient supine lying, multiple sets/reps   Soft tissue mobilization right shoulder soft tissue mobilizaiton pectoral muscle, subscapularis, lats and upper trapezius, myofascial arm pull right UE, upper trapezius release x 3 reps 20 seconds with patient side lying       patient response to treatment: right shoulder forward elevation in sitting improved to 110 degrees at end of session, patient demonstrated  improved ability to perform all exercises following soft tissue/myofascial release techniques with exercises, improved alignment of right shoulder with side lying abduction following additional instruction and tactile cuing        PT Education - 07/20/15 1910    Education provided Yes   Education Details HEP: continue as instructed with ROM/strengthening, corrected side lying abduction exercise   Person(s) Educated Patient   Methods Explanation;Demonstration;Tactile cues;Verbal cues   Comprehension Verbalized understanding;Returned  demonstration;Verbal cues required             PT Long Term Goals - 07/20/15 1915    PT LONG TERM GOAL #1   Title Patient will demonstrate improved function with daily activties with decreased pain with Quick Dash score of 35% by 07/18/2015   Baseline QuickDash 50% impairment (07/20/2015 = 35%)   Status Achieved   PT LONG TERM GOAL #2   Title Patient will have 120 degrees of active forward elevation and 45 degrees ER by 07/18/2015 for improved functional use right shoulder/UE for personal care and work related tasks above shoulder   Baseline AROM right shoulder 80 degrees with hiking right shoulder (supine lying 100 degrees), ER 20 degrees  (current 07/20/2015 up to 110 degrees with mild hiking of shoulder)   Status Partially Met   PT LONG TERM GOAL #3   Title Patient will be independent with home program for self management of pain and exercise for ROM and strengthening right UE/shoulder by 08/18/2015 in order to continue to progress towards full functional use right UE and prior level of funciton    Baseline limited knowledge of appropriate exercise and progression in order to return to full functional use wihtout difficulty/limitaitons   Status Revised   PT LONG TERM GOAL #4   Title Patient will have 120 degrees of active forward elevation and 45 degrees ER by 08/18/2015 demonstrating improved strength for functional use right shoulder/UE for personal care and work related tasks above shoulder   Baseline AROM 90 to 110 degrees forward elevation   Status New   PT LONG TERM GOAL #5   Title Patient will demonstrate improved function with daily activties with decreased pain with Quick Dash score of 20% or less by 08/18/2015   Baseline Quick Dash 35%   Status New               Plan - 07/20/15 1912    Clinical Impression Statement Patient is progressing well with improving forward elevation AROM and strength. She is able to raise right arm above shoulder level to 110 degrees with  imrpoving control and alignment and continues with decreased strength and endurance as primary limitations. She continues with stiffness and limited soft tissue movility in right UE/shoulder. She is responding well to current therapy interventions and should continue to improve with additional skilled interventions.    Pt will benefit from skilled therapeutic intervention in order to improve on the following deficits Decreased strength;Pain;Impaired UE functional use;Increased muscle spasms;Decreased range of motion   Rehab Potential Good   Clinical Impairments Affecting Rehab Potential (+) motivated, active lifestyle, acute condition    PT Frequency 2x / week   PT Duration 4 weeks   PT Treatment/Interventions Manual techniques;Neuromuscular re-education;Patient/family education;Cryotherapy;Therapeutic exercise;Moist Heat;Electrical Stimulation   PT Next Visit Plan manual therapy, progressive exericse for strength and ROM    PT Home Exercise Plan AAROM, strengthening right shoulder with light weights, self mobilization, side lying abduciton with assistance        Problem List Patient Active  Problem List   Diagnosis Date Noted  . Dermatitis of left eyelid 06/30/2015  . Closed fracture of part of upper end of humerus 06/18/2015  . Shingles 02/13/2015  . Iron deficiency anemia 12/29/2013  . Essential hypertension 12/29/2013  . Plantar fasciitis 12/29/2013  . Overweight 12/29/2013    Jomarie Longs PT 07/21/2015, 1:13 PM  [facility] [address], [place], [address] Phone: [phone]   Fax:  [phone]  Name: [name] MRN: [id number] Date of Birth: 1966-02-12

## 2015-07-24 ENCOUNTER — Ambulatory Visit: Payer: BC Managed Care – PPO | Admitting: Physical Therapy

## 2015-07-24 ENCOUNTER — Encounter: Payer: Self-pay | Admitting: Physical Therapy

## 2015-07-24 DIAGNOSIS — M25511 Pain in right shoulder: Secondary | ICD-10-CM

## 2015-07-24 DIAGNOSIS — M25611 Stiffness of right shoulder, not elsewhere classified: Secondary | ICD-10-CM

## 2015-07-24 DIAGNOSIS — M6281 Muscle weakness (generalized): Secondary | ICD-10-CM

## 2015-07-24 NOTE — Therapy (Signed)
[facility] [address], [place], [address] Phone: [phone]   Fax:  [phone]  Physical Therapy Treatment  Patient Details  Name: [name] MRN: [id number] Date of Birth: 1966-02-15 Referring Provider: Christophe Louis MD  Encounter Date: 07/24/2015      PT End of Session - 07/24/15 1918    Visit Number 10   Number of Visits 16   Date for PT Re-Evaluation 08/18/15   PT Start Time 1832   PT Stop Time 1912   PT Time Calculation (min) 40 min   Activity Tolerance Patient tolerated treatment well   Behavior During Therapy [facility] for tasks assessed/performed      Past Medical History  Diagnosis Date  . Depression   . Heart murmur   . Hypertension   . Anemia   . History of genital warts   . History of chicken pox   . H/O scarlet fever     Past Surgical History  Procedure Laterality Date  . Abdominal hysterectomy  2005  . Knee surgery Left     2015    There were no vitals filed for this visit.  Visit Diagnosis:  Shoulder stiffness, right  Muscle weakness of right upper extremity  Pain in right shoulder      Subjective Assessment - 07/24/15 1834    Subjective Patient reports increased shoulder soreness the day after she receives therapy. States her shoulder feels tight today but does not exhibit any pain.   Limitations Lifting;House hold activities   Patient Stated Goals to improved ROM and strength right shoulder/UE back to normal to return to prior level of funciton   Currently in Pain? No/denies      Objective: Pre treatment: AROM right shoulder sitting: 85 degrees with mild to no hiking of shoulder    [facility] Adult PT Treatment/Exercise - 07/24/15 1835    Exercises   Exercises Other Exercises   Other Exercises  patient performed exercises with guidance, verbal and tactile cues and demonstration of PT: supine lying: AAROM/PROM right UE forward elevation,   abduction in side lying with using left UE for assist -- 2 x 10 sidelying serratus punch -- x 15 Pec stretch in supine -- 10sec x3 Rthymic Stabilization -- circles x10 clockwise/counterclockwise  Sidelying shoulder ER with towel -- x10  all in conjunction with joint and soft tissue mobilization   Manual Therapy   Manual Therapy Soft tissue mobilization;Joint mobilization   Manual therapy comments decreased soft tissue mobility right pectoral muscle, subscapularis, lats,pecs and upper trapezius   Joint Mobilization GHJ lateral distaction with anterior/posterior glides and caudal glides grades 2-3 with patient supine lying, 30sec x 2 sets   Soft tissue mobilization right shoulder soft tissue mobilizaiton pectoral muscle, subscapularis, and upper trapezius, Sidelying assisted scapular retraction and downward rotation.     .    Patient response to treatment: right shoulder forward elevation in supine improved to 130 degrees following joint mobilizations, improved palpable muscle activation with shoulder ER in side lying after towel was placed under arm. Improved technique and motor control with performance of exercises after performance of soft tissue mobilizations, mild/no soreness reported at end of session        PT Education - 07/24/15 1919    Education provided Yes   Education Details HEP: continue with ROM/Strengthening, adding towel at home to sidelying shoulder ER    Person(s) Educated Patient   Methods Explanation;Demonstration;Tactile cues;Verbal cues   Comprehension Returned  demonstration;Verbalized understanding;Verbal cues required             PT Long Term Goals - 07/20/15 1915    PT LONG TERM GOAL #1   Title Patient will demonstrate improved function with daily activties with decreased pain with Quick Dash score of 35% by 07/18/2015   Baseline QuickDash 50% impairment (07/20/2015 = 35%)   Status Achieved   PT LONG TERM GOAL #2   Title Patient will have  120 degrees of active forward elevation and 45 degrees ER by 07/18/2015 for improved functional use right shoulder/UE for personal care and work related tasks above shoulder   Baseline AROM right shoulder 80 degrees with hiking right shoulder (supine lying 100 degrees), ER 20 degrees  (current 07/20/2015 up to 110 degrees with mild hiking of shoulder)   Status Partially Met   PT LONG TERM GOAL #3   Title Patient will be independent with home program for self management of pain and exercise for ROM and strengthening right UE/shoulder by 08/18/2015 in order to continue to progress towards full functional use right UE and prior level of funciton    Baseline limited knowledge of appropriate exercise and progression in order to retur to full functional use wihtout difficulty/limitaitons   Status Revised   PT LONG TERM GOAL #4   Title Patient will have 120 degrees of active forward elevation and 45 degrees ER by 08/18/2015 demonstrating improved strength for functional use right shoulder/UE for personal care and work related tasks above shoulder   Baseline AROM 90 to 110 degrees forward elevation   Status New   PT LONG TERM GOAL #5   Title Patient will demonstrate improved function with daily activties with decreased pain with Quick Dash score of 20% or less by 08/18/2015   Baseline Quick Dash 35%   Status New               Plan - 07/24/15 1930    Clinical Impression Statement Patient is progressing well towards long term goals with ability to actively raise arm further overhead in both supine and sitting positions with minimal to no hiking of shoulder. Improved motor control with shoulder motions today with decreased upper trap activation during shoulder flexion.Patient continues with limitations of shoulder AROM, strength, muscle coordination and endurance and will benefit from further skilled therapy to return to prior level of function.    Pt will benefit from skilled therapeutic intervention in  order to improve on the following deficits Decreased strength;Pain;Impaired UE functional use;Increased muscle spasms;Decreased range of motion   Rehab Potential Good   Clinical Impairments Affecting Rehab Potential (+) motivated, active lifestyle, acute condition    PT Frequency 2x / week   PT Duration 4 weeks   PT Treatment/Interventions Manual techniques;Neuromuscular re-education;Patient/family education;Cryotherapy;Therapeutic exercise;Moist Heat;Electrical Stimulation   PT Next Visit Plan manual therapy, progressive exericse for strength and ROM    PT Home Exercise Plan AAROM, strengthening right shoulder with light weights, self mobilization, side lying abduciton with assistance, shoulder ER in sidelying   Consulted and Agree with Plan of Care Patient        Problem List Patient Active Problem List   Diagnosis Date Noted  . Dermatitis of left eyelid 06/30/2015  . Closed fracture of part of upper end of humerus 06/18/2015  . Shingles 02/13/2015  . Iron deficiency anemia 12/29/2013  . Essential hypertension 12/29/2013  . Plantar fasciitis 12/29/2013  . Overweight 12/29/2013    Blythe Stanford, SPT 07/24/2015, 7:37 PM  [facility] [address], [place], [address] Phone: [phone]   Fax:  [phone]  Name: [name] MRN: [id number] Date of Birth: 1966/02/23

## 2015-07-26 ENCOUNTER — Ambulatory Visit: Payer: BC Managed Care – PPO | Admitting: Physical Therapy

## 2015-07-26 ENCOUNTER — Encounter: Payer: Self-pay | Admitting: Physical Therapy

## 2015-07-26 DIAGNOSIS — M6281 Muscle weakness (generalized): Secondary | ICD-10-CM

## 2015-07-26 DIAGNOSIS — M25611 Stiffness of right shoulder, not elsewhere classified: Secondary | ICD-10-CM | POA: Diagnosis not present

## 2015-07-26 DIAGNOSIS — M25511 Pain in right shoulder: Secondary | ICD-10-CM

## 2015-07-26 NOTE — Therapy (Signed)
[facility] [address] [place], [address] Phone: [phone]   Fax:  [phone]  Physical Therapy Treatment  Patient Details  Name: SOLINA HERON MRN: [id number] Date of Birth: Nov 17, 1965 Referring Provider: Christophe Louis MD  Encounter Date: 07/26/2015      PT End of Session - 07/26/15 1919    Visit Number 11   Number of Visits 16   Date for PT Re-Evaluation 08/18/15   PT Start Time 2951   PT Stop Time 1909   PT Time Calculation (min) 39 min   Activity Tolerance Patient tolerated treatment well   Behavior During Therapy [facility] for tasks assessed/performed      Past Medical History  Diagnosis Date  . Depression   . Heart murmur   . Hypertension   . Anemia   . History of genital warts   . History of chicken pox   . H/O scarlet fever     Past Surgical History  Procedure Laterality Date  . Abdominal hysterectomy  2005  . Knee surgery Left     2015    There were no vitals filed for this visit.  Visit Diagnosis:  Shoulder stiffness, right  Muscle weakness of right upper extremity  Pain in right shoulder      Subjective Assessment - 07/26/15 1831    Subjective Patient reports increased soreness during the evening of 07/25/15 but states she is overall improving. States minor stiffness today.    Limitations Lifting;House hold activities   Patient Stated Goals to improved ROM and strength right shoulder/UE back to normal to return to prior level of funciton   Currently in Pain? No/denies      Objective: Pre treatment: AROM right shoulder sitting: 95 degrees with mild to no hiking of shoulder(115 degrees at end of treatment), abduction in sitting: 55 degrees   [facility] Adult PT Treatment/Exercise - 07/26/15 1835    Exercises   Exercises Other Exercises   Other Exercises  patient performed exercises with guidance, verbal and tactile cues and demonstration of  PT:  supine lying: AAROM/PROM right UE forward elevation -- x 20 abduction in side lying with using left UE for assist --x 20 sidelying serratus punch -- x 20 Pec stretch in supine -- 10sec x3 Rthymic Stabilization in supine-- circles x15 clockwise/counterclockwise #2 Sidelying shoulder ER with towel -- x10 all in conjunction with joint and soft tissue mobilization   Manual Therapy   Manual Therapy Soft tissue mobilization;Joint mobilization   Manual therapy comments decreased soft tissue mobility right pectoral muscle, subscapularis, and upper trapezius   Joint Mobilization GHJ lateral distaction with posterior glides and caudal glides grades 2-3 with patient supine lying, 30sec x 2 sets   Soft tissue mobilization right shoulder soft tissue mobilization pectoral muscles, subscapularis, and upper trapezius sitting and supine positions    .    Patient response to treatment: Improved shoulder flexion from 95 degrees to 115 degrees at the end of session indicating improved tissue elasticity.  Pectoral muscle spasms improved 50% after STM and patient performed shoulder flexion/abd with improved motor control after minimal cueing to engage scapular musculature.          PT Long Term Goals - 07/20/15 1915    PT LONG TERM GOAL #1   Title Patient will demonstrate improved function with daily activties with decreased pain with Quick Dash score of 35% by 07/18/2015   Baseline QuickDash 50% impairment (07/20/2015 = 35%)  Status Achieved   PT LONG TERM GOAL #2   Title Patient will have 120 degrees of active forward elevation and 45 degrees ER by 07/18/2015 for improved functional use right shoulder/UE for personal care and work related tasks above shoulder   Baseline AROM right shoulder 80 degrees with hiking right shoulder (supine lying 100 degrees), ER 20 degrees  (current 07/20/2015 up to 110 degrees with mild hiking of shoulder)   Status Partially Met   PT LONG  TERM GOAL #3   Title Patient will be independent with home program for self management of pain and exercise for ROM and strengthening right UE/shoulder by 08/18/2015 in order to continue to progress towards full functional use right UE and prior level of funciton    Baseline limited knowledge of appropriate exercise and progression in order to retur to full functional use wihtout difficulty/limitaitons   Status Revised   PT LONG TERM GOAL #4   Title Patient will have 120 degrees of active forward elevation and 45 degrees ER by 08/18/2015 demonstrating improved strength for functional use right shoulder/UE for personal care and work related tasks above shoulder   Baseline AROM 90 to 110 degrees forward elevation   Status New   PT LONG TERM GOAL #5   Title Patient will demonstrate improved function with daily activties with decreased pain with Quick Dash score of 20% or less by 08/18/2015   Baseline Quick Dash 35%   Status New               Plan - 07/26/15 1919    Clinical Impression Statement Patient progressing well towards long term goals with increased ability to raise arm into flexion against gravity. Patient required minimal tactile/verbal cueing to perform exercises today indicating improved functional carryover between visits. Pt continues with decreased muscular strength/endurance and coordination and will benefit from further skilled therapy aimed at improving limitations in order to return to prior level of function.    Pt will benefit from skilled therapeutic intervention in order to improve on the following deficits Decreased strength;Pain;Impaired UE functional use;Increased muscle spasms;Decreased range of motion   Rehab Potential Good   Clinical Impairments Affecting Rehab Potential (+) motivated, active lifestyle, acute condition    PT Frequency 2x / week   PT Duration 4 weeks   PT Treatment/Interventions Manual techniques;Neuromuscular re-education;Patient/family  education;Cryotherapy;Therapeutic exercise;Moist Heat;Electrical Stimulation   PT Next Visit Plan manual therapy, progressive exericse for strength and ROM    PT Home Exercise Plan AAROM, strengthening right shoulder with light weights, self mobilization, side lying abduciton with assistance, shoulder ER in sidelying   Consulted and Agree with Plan of Care Patient        Problem List Patient Active Problem List   Diagnosis Date Noted  . Dermatitis of left eyelid 06/30/2015  . Closed fracture of part of upper end of humerus 06/18/2015  . Shingles 02/13/2015  . Iron deficiency anemia 12/29/2013  . Essential hypertension 12/29/2013  . Plantar fasciitis 12/29/2013  . Overweight 12/29/2013    Blythe Stanford, SPT 07/26/2015, 7:24 PM  [facility] [address] [place], [address] Phone: [phone]   Fax:  [phone]  Name: DALY WHIPKEY MRN: [id number] Date of Birth: April 19, 1966

## 2015-07-27 ENCOUNTER — Encounter: Payer: BC Managed Care – PPO | Admitting: Physical Therapy

## 2015-08-01 ENCOUNTER — Ambulatory Visit: Payer: BC Managed Care – PPO | Admitting: Physical Therapy

## 2015-08-01 ENCOUNTER — Encounter: Payer: Self-pay | Admitting: Physical Therapy

## 2015-08-01 DIAGNOSIS — M25611 Stiffness of right shoulder, not elsewhere classified: Secondary | ICD-10-CM

## 2015-08-01 DIAGNOSIS — M25511 Pain in right shoulder: Secondary | ICD-10-CM

## 2015-08-01 DIAGNOSIS — M6281 Muscle weakness (generalized): Secondary | ICD-10-CM

## 2015-08-01 NOTE — Therapy (Signed)
[facility] [address], [place], [address] Phone: [phone]   Fax:  [phone]  Physical Therapy Treatment  Patient Details  Name: [name] MRN: [id number] Date of Birth: 1965/09/13 Referring Provider: Christophe Louis MD  Encounter Date: 08/01/2015      PT End of Session - 08/01/15 1938    Visit Number 12   Number of Visits 16   Date for PT Re-Evaluation 08/18/15   PT Start Time 0240   PT Stop Time 1929   PT Time Calculation (min) 56 min   Activity Tolerance Patient tolerated treatment well   Behavior During Therapy [facility] for tasks assessed/performed      Past Medical History  Diagnosis Date  . Depression   . Heart murmur   . Hypertension   . Anemia   . History of genital warts   . History of chicken pox   . H/O scarlet fever     Past Surgical History  Procedure Laterality Date  . Abdominal hysterectomy  2005  . Knee surgery Left     2015    There were no vitals filed for this visit.  Visit Diagnosis:  Shoulder stiffness, right  Muscle weakness of right upper extremity  Pain in right shoulder      Subjective Assessment - 08/01/15 1836    Subjective Patient reports she has had increased difficulty with sleeping with pain over the past week. She reports she has had more pain in her right shoulder and arm in general. Today she has felt better.    Limitations Lifting;House hold activities   Patient Stated Goals to improved ROM and strength right shoulder/UE back to normal to return to prior level of funciton   Currently in Pain? No/denies      Objective: AROM: right shoulder forward elevation in sitting pre treatment: 110 degrees, IR behind back to hip pocket AAROM: right shoulder ER supine lying 60 Strength: right shoulder flexion 3+/5, ER 3+/5, IR 3+/5 Palpation: decreased soft tissue elasticity right shoulder, upper trapezius, lats., pectoral muscles         [facility] Adult PT Treatment/Exercise - 08/01/15 1942    Exercises   Exercises Other Exercises   Other Exercises  patient performed exercises with guidance, verbal and tactile cues and demonstration of PT: supine lying: AAORM/PROM right UE forward elevation, ER, IR and abduction in conjunction with joint and soft tissue mobilization, rhythmic stabilization at 90 degrees 2 sets 30 seconds, ER/IR 2 x 30 seconds, side lying right shoulder elevation/depression 2 x 10, IR behind back 3 x 5 reps, prone row, shoulder extension, horizontal abduction at tolerated partial ROM 2 sets up to 10 reps, standing IR/ER passing stick behind back 10 reps each direction   Manual Therapy   Manual Therapy Soft tissue mobilization;Joint mobilization   Manual therapy comments decreased soft tissue mobility right pectoral muscle, subscapularis, lats and upper trapezius   Joint Mobilization GHJ lateral distaction with anterior/posterior glides and caudal glides grades 2-3 x 2-3 sets   Soft tissue mobilization right shoulder soft tissue mobilizaiton pectoral muscle, subscapularis, lats and upper trapezius,       Moist heat applied to right shoulder at end of session with patient in seated position x 15 min., no adverse effects noted following treatment  Patient response to treatment: improved AROM demonstrating improved strength right UE, minimal cuing required for exercises            PT Education - 08/01/15  1940    Education provided Yes   Education Details HEP added prone exercises and side lying IR behind back, standing pass object around trunk both directions for improving rotations   Person(s) Educated Patient   Methods Explanation;Demonstration;Verbal cues;Handout   Comprehension Verbalized understanding;Returned demonstration;Verbal cues required             PT Long Term Goals - 07/20/15 1915    PT LONG TERM GOAL #1   Title Patient will demonstrate improved function with daily activties with decreased  pain with Quick Dash score of 35% by 07/18/2015   Baseline QuickDash 50% impairment (07/20/2015 = 35%)   Status Achieved   PT LONG TERM GOAL #2   Title Patient will have 120 degrees of active forward elevation and 45 degrees ER by 07/18/2015 for improved functional use right shoulder/UE for personal care and work related tasks above shoulder   Baseline AROM right shoulder 80 degrees with hiking right shoulder (supine lying 100 degrees), ER 20 degrees  (current 07/20/2015 up to 110 degrees with mild hiking of shoulder)   Status Partially Met   PT LONG TERM GOAL #3   Title Patient will be independent with home program for self management of pain and exercise for ROM and strengthening right UE/shoulder by 08/18/2015 in order to continue to progress towards full functional use right UE and prior level of funciton    Baseline limited knowledge of appropriate exercise and progression in order to retur to full functional use wihtout difficulty/limitaitons   Status Revised   PT LONG TERM GOAL #4   Title Patient will have 120 degrees of active forward elevation and 45 degrees ER by 08/18/2015 demonstrating improved strength for functional use right shoulder/UE for personal care and work related tasks above shoulder   Baseline AROM 90 to 110 degrees forward elevation   Status New   PT LONG TERM GOAL #5   Title Patient will demonstrate improved function with daily activties with decreased pain with Quick Dash score of 20% or less by 08/18/2015   Baseline Quick Dash 35%   Status New               Plan - 08/01/15 1938    Clinical Impression Statement Improving strength and endurance with noted improving functional use of right UE for daily tasks at home and work. Increased soreness may be due to increased use and improving flexibility. She continues with limitations of weakness and flexibility d will require additional physical therapy intervention to achieve goals.    Pt will benefit from skilled  therapeutic intervention in order to improve on the following deficits Decreased strength;Pain;Impaired UE functional use;Increased muscle spasms;Decreased range of motion   Rehab Potential Good   PT Frequency 2x / week   PT Duration 4 weeks   PT Treatment/Interventions Manual techniques;Neuromuscular re-education;Patient/family education;Cryotherapy;Therapeutic exercise;Moist Heat;Electrical Stimulation   PT Next Visit Plan manual therapy, progressive exericse for strength and ROM    PT Home Exercise Plan AAROM, strengthening right shoulder with light weights, self mobilization, side lying abduciton with assistance, shoulder ER in sidelying, IR behing back with ruler and side lying IR behind back, prone lying row, horizontal abduction, shoulder extension        Problem List Patient Active Problem List   Diagnosis Date Noted  . Dermatitis of left eyelid 06/30/2015  . Closed fracture of part of upper end of humerus 06/18/2015  . Shingles 02/13/2015  . Iron deficiency anemia 12/29/2013  . Essential hypertension 12/29/2013  .  Plantar fasciitis 12/29/2013  . Overweight 12/29/2013    Jomarie Longs PT 08/02/2015, 5:02 PM  [facility] [address], [place], [address] Phone: [phone]   Fax:  [phone]  Name: [name] MRN: [id number] Date of Birth: 08/26/1965

## 2015-08-03 ENCOUNTER — Encounter: Payer: BC Managed Care – PPO | Admitting: Physical Therapy

## 2015-08-07 ENCOUNTER — Ambulatory Visit: Payer: BC Managed Care – PPO | Admitting: Physical Therapy

## 2015-08-09 ENCOUNTER — Ambulatory Visit: Payer: BC Managed Care – PPO | Admitting: Physical Therapy

## 2015-08-09 ENCOUNTER — Encounter: Payer: Self-pay | Admitting: Physical Therapy

## 2015-08-09 DIAGNOSIS — M25611 Stiffness of right shoulder, not elsewhere classified: Secondary | ICD-10-CM

## 2015-08-09 DIAGNOSIS — M6281 Muscle weakness (generalized): Secondary | ICD-10-CM

## 2015-08-10 ENCOUNTER — Encounter: Payer: BC Managed Care – PPO | Admitting: Physical Therapy

## 2015-08-10 NOTE — Therapy (Signed)
[facility] [address] [place], [address] Phone: [phone]   Fax:  [phone]  Physical Therapy Treatment  Patient Details  Name: [name] MRN: [id number] Date of Birth: [birth year] Referring Provider: Christophe Louis MD  Encounter Date: 08/09/2015      PT End of Session - 08/09/15 1945    Visit Number 13   Number of Visits 16   Date for PT Re-Evaluation 08/18/15   PT Start Time 6073   PT Stop Time 1915   PT Time Calculation (min) 42 min   Activity Tolerance Patient tolerated treatment well   Behavior During Therapy [facility] for tasks assessed/performed      Past Medical History  Diagnosis Date  . Depression   . Heart murmur   . Hypertension   . Anemia   . History of genital warts   . History of chicken pox   . H/O scarlet fever     Past Surgical History  Procedure Laterality Date  . Abdominal hysterectomy  2005  . Knee surgery Left     2015    There were no vitals filed for this visit.  Visit Diagnosis:  Shoulder stiffness, right  Muscle weakness of right upper extremity      Subjective Assessment - 08/09/15 1836    Subjective Patient reports she is doing better with her right shoulder and able to use her right arm more for daily tasks. She feels she is ready to return to the gym with guidance.    Limitations Lifting;House hold activities   Patient Stated Goals to improved ROM and strength right shoulder/UE back to normal to return to prior level of funciton   Currently in Pain? No/denies      Objective: AROM: right shoulder forward elevation in sitting100, 110, 120; IR behind back to hip pocket AAROM: right shoulder ER supine lying 60; forward elevation 125 Strength: right shoulder flexion 3+/5, ER 3+/5, IR 3+/5 Palpation: decreased soft tissue elasticity right shoulder, upper trapezius, lats., pectoral muscles   Treatment:  Exercises:   patient performed  exercises with guidance, verbal and tactile cues and demonstration of PT: supine lying: AAROM/PROM right UE forward elevation, ER, IR and abduction in conjunction with joint and soft tissue mobilization, rhythmic stabilization at 90 degrees 2 sets 30 seconds, ER/IR 2 x 30 seconds, 5# weight for AA forward elevation overhead x 10 reps side lying right shoulder elevation/depression 2 x 10, ER with 2# x 10 reps, 3# x 5 reps with towel under arm OMEGA cable pull: seated scapular retraction 10# x 15, straight arm pull down 10# x 10, seated reverse chin ups x 10, 15#    Manual therapy:   joint mobilization right shoulder with patient supine lying: GHJ lateral distaction with anterior/posterior glides and caudal glides grades 2-3 x 2-3 sets, right shoulder soft tissue mobilizaiton pectoral muscle, subscapularis, lats and upper trapezius    Patient response to treatment: improved AROM demonstrating improved strength right UE, soft tissue improved elasticity allowing patient to perform AAROM through increased range with less difficulty, minimal cuing required for exercises; improved technique with repetition and guidance of PT         PT Education - 08/09/15 1847    Education provided Yes   Education Details HEP with instruction of how to modify exercises for gym for single arm rows and extension on weight bench; cable machine exercises for gym demonstrated   Person(s) Educated Patient   Methods  Explanation;Demonstration;Verbal cues   Comprehension Verbalized understanding;Returned demonstration;Verbal cues required             PT Long Term Goals - 07/20/15 1915    PT LONG TERM GOAL #1   Title Patient will demonstrate improved function with daily activties with decreased pain with Quick Dash score of 35% by 07/18/2015   Baseline QuickDash 50% impairment (07/20/2015 = 35%)   Status Achieved   PT LONG TERM GOAL #2   Title Patient will have 120 degrees of active forward elevation and 45 degrees  ER by 07/18/2015 for improved functional use right shoulder/UE for personal care and work related tasks above shoulder   Baseline AROM right shoulder 80 degrees with hiking right shoulder (supine lying 100 degrees), ER 20 degrees  (current 07/20/2015 up to 110 degrees with mild hiking of shoulder)   Status Partially Met   PT LONG TERM GOAL #3   Title Patient will be independent with home program for self management of pain and exercise for ROM and strengthening right UE/shoulder by 08/18/2015 in order to continue to progress towards full functional use right UE and prior level of funciton    Baseline limited knowledge of appropriate exercise and progression in order to retur to full functional use wihtout difficulty/limitaitons   Status Revised   PT LONG TERM GOAL #4   Title Patient will have 120 degrees of active forward elevation and 45 degrees ER by 08/18/2015 demonstrating improved strength for functional use right shoulder/UE for personal care and work related tasks above shoulder   Baseline AROM 90 to 110 degrees forward elevation   Status New   PT LONG TERM GOAL #5   Title Patient will demonstrate improved function with daily activties with decreased pain with Quick Dash score of 20% or less by 08/18/2015   Baseline Quick Dash 35%   Status New               Plan - 08/09/15 1848    Clinical Impression Statement  Patient is progressing well towards goals and is compliant with home program. She is improving in strength and function with daily activties and continues with limitations of strength, ROM.   Pt will benefit from skilled therapeutic intervention in order to improve on the following deficits Decreased strength;Pain;Impaired UE functional use;Increased muscle spasms;Decreased range of motion   Rehab Potential Good   PT Frequency 2x / week   PT Duration 4 weeks   PT Treatment/Interventions Manual techniques;Neuromuscular re-education;Patient/family  education;Cryotherapy;Therapeutic exercise;Moist Heat;Electrical Stimulation   PT Next Visit Plan manual therapy, progressive exericse for strength and ROM         Problem List Patient Active Problem List   Diagnosis Date Noted  . Dermatitis of left eyelid 06/30/2015  . Closed fracture of part of upper end of humerus 06/18/2015  . Shingles 02/13/2015  . Iron deficiency anemia 12/29/2013  . Essential hypertension 12/29/2013  . Plantar fasciitis 12/29/2013  . Overweight 12/29/2013    Jomarie Longs PT 08/10/2015, 1:08 PM  [facility] [address] [place], [address] Phone: [phone]   Fax:  [phone]  Name: [name] MRN: [id number] Date of Birth: 1965-09-18

## 2015-08-14 ENCOUNTER — Encounter: Payer: Self-pay | Admitting: Physical Therapy

## 2015-08-14 ENCOUNTER — Ambulatory Visit: Payer: BC Managed Care – PPO | Admitting: Physical Therapy

## 2015-08-14 DIAGNOSIS — M25611 Stiffness of right shoulder, not elsewhere classified: Secondary | ICD-10-CM

## 2015-08-14 DIAGNOSIS — M6281 Muscle weakness (generalized): Secondary | ICD-10-CM

## 2015-08-14 DIAGNOSIS — M25511 Pain in right shoulder: Secondary | ICD-10-CM

## 2015-08-14 NOTE — Therapy (Signed)
[facility] [address] [place], [place], [address] Phone: [phone]   Fax:  [phone]  Physical Therapy Treatment/Discharge Summary  Patient Details  Name: Erica LassRebecca A Velazquez MRN: [id number] Date of Birth: Jul 24, 1965 Referring Provider: Myra RudeSmith, Christopher MD  Encounter Date: 08/14/2015   Patient began physical therapy 06/20/2015 and attended 14 sessions through 08/14/2015. All goals have been achieved and patient is independent with home program and ready for discharge      PT End of Session - 08/14/15 1915    Visit Number 14   Number of Visits 16   Date for PT Re-Evaluation 08/18/15   PT Start Time 1817   PT Stop Time 1902   PT Time Calculation (min) 45 min   Activity Tolerance Patient tolerated treatment well   Behavior [facility] for tasks assessed/performed      Past Medical History  Diagnosis Date  . Depression   . Heart murmur   . Hypertension   . Anemia   . History of genital warts   . History of chicken pox   . H/O scarlet fever     Past Surgical History  Procedure Laterality Date  . Abdominal hysterectomy  2005  . Knee surgery Left     2015    There were no vitals filed for this visit.  Visit Diagnosis:  Shoulder stiffness, right  Muscle weakness of right upper extremity  Pain in right shoulder      Subjective Assessment - 08/14/15 1910    Subjective Patient reports she is much better and able to raise arm overhead with less difficulty and for improved function with daily activities. She is able to exercise on own and agrees to discharge.    Limitations Lifting;House hold activities   Patient Stated Goals to improved ROM and strength right shoulder/UE back to normal to return to prior level of funciton   Currently in Pain? No/denies      Objective: Posture: right shoulder forward as compared to left, + mild hiking right shoulder AAROM right shoulder AROM sitting  forward elevation 120, abduction up to 70 degrees, ER 60 Strength: grossly with static testing in supine lying forward elevation 4-/5, extension 4-/5, ER/IR 4/5 Palpation: + spasms along cervical spine right, upper trapezius right, pectoral muscles right and deltoid muslce  Outcome Measure: QuickDash 16%, work module 19%  Treatment:  Exercises:   patient performed exercises with guidance, verbal and tactile cues and demonstration of PT: supine lying: AAROM/PROM right UE forward elevation, ER, IR and abduction in conjunction with joint and soft tissue mobilization, rhythmic stabilization at 90 degrees 2 sets 30 seconds, ER/IR 2 x 30 seconds,  side lying right shoulder elevation/depression 2 x 10, ER with with towel under arm, AAROM shoulder abduction with assistance of left UE and therapist  Reassessed home program verbally for ROM, strengthening exercises  Manual therapy:   joint mobilization right shoulder with patient supine lying: GHJ lateral distaction with anterior/posterior glides and caudal glides grades 2-3 x 2-3 sets, right shoulder soft tissue mobilizaiton pectoral muscle, deltoid, upper trapezius   Patient response to treatment: improved soft tissue elasticity with improved AAROM forward elevation to 135 degrees with less stiffness in shoulder and demonstrated improved technique with decreased hiking of right shoulder with all exercises with verbal cued, tactile cues and repetition, decreased pain with right shoulder abduction following STM deltoid, verbalized good understanding of home program        PT Education -  08/14/15 1915    Education provided Yes   Education Details HEP reviewed with patient for ROM, strengthening   Person(s) Educated Patient   Methods Explanation   Comprehension Verbalized understanding             PT Long Term Goals - 08/14/15 2132    PT LONG TERM GOAL #2   Title Patient will have 120 degrees of active forward elevation and 45 degrees ER  by 07/18/2015 for improved functional use right shoulder/UE for personal care and work related tasks above shoulder   Baseline AROM right shoulder 80 degrees with hiking right shoulder (supine lying 100 degrees), ER 20 degrees  (current 07/20/2015 up to 110 degrees with mild hiking of shoulder) current 08/14/15 AROM sitting 120 AROM flexion   Status Achieved   PT LONG TERM GOAL #3   Title Patient will be independent with home program for self management of pain and exercise for ROM and strengthening right UE/shoulder by 08/18/2015 in order to continue to progress towards full functional use right UE and prior level of funciton    Baseline limited knowledge of appropriate exercise and progression in order to retur to full functional use wihtout difficulty/limitaitons (current 08/14/15: independent )   Status Achieved   PT LONG TERM GOAL #5   Title Patient will demonstrate improved function with daily activties with decreased pain with Quick Dash score of 20% or less by 08/18/2015   Baseline Quick Dash 35%  (current 08/14/15 16%, 19% for work module)   Status Achieved               Plan - 08/14/15 1920    Clinical Impression Statement Patinet has achieved goals for independent home program, QuickDash <20% indicating mild self perceived disabiltiy. She is compliant with home program and ready for discharge from physical therapy.    Pt will benefit from skilled therapeutic intervention in order to improve on the following deficits Decreased strength;Pain;Impaired UE functional use;Increased muscle spasms;Decreased range of motion   Rehab Potential Good   PT Frequency 2x / week   PT Duration 4 weeks   PT Treatment/Interventions Manual techniques;Neuromuscular re-education;Patient/family education;Cryotherapy;Therapeutic exercise;Moist Heat;Electrical Stimulation   PT Next Visit Plan discharge   PT Home Exercise Plan AAROM, strengthening right shoulder with light weights, self mobilization, side lying  abduciton with assistance, shoulder ER in sidelying, IR behing back with ruler and side lying IR behind back, prone lying row, horizontal abduction, shoulder extension   Recommended Other Services massage therapy by licensed therapist   Consulted and Agree with Plan of Care Patient        Problem List Patient Active Problem List   Diagnosis Date Noted  . Dermatitis of left eyelid 06/30/2015  . Closed fracture of part of upper end of humerus 06/18/2015  . Shingles 02/13/2015  . Iron deficiency anemia 12/29/2013  . Essential hypertension 12/29/2013  . Plantar fasciitis 12/29/2013  . Overweight 12/29/2013    Beacher May PT 08/14/2015, 9:37 PM  [facility] [facility] [address] [place], [address] Phone: [phone]   Fax:  [phone]  Name: MATALIE ROMBERGER MRN: [id number] Date of Birth: [birth year]

## 2015-08-22 ENCOUNTER — Encounter: Payer: BC Managed Care – PPO | Admitting: Physical Therapy

## 2015-09-17 ENCOUNTER — Other Ambulatory Visit: Payer: Self-pay | Admitting: Nurse Practitioner

## 2015-10-24 ENCOUNTER — Ambulatory Visit (INDEPENDENT_AMBULATORY_CARE_PROVIDER_SITE_OTHER): Payer: BC Managed Care – PPO | Admitting: Podiatry

## 2015-10-24 ENCOUNTER — Encounter: Payer: Self-pay | Admitting: Podiatry

## 2015-10-24 DIAGNOSIS — M7661 Achilles tendinitis, right leg: Secondary | ICD-10-CM

## 2015-10-24 DIAGNOSIS — M7730 Calcaneal spur, unspecified foot: Secondary | ICD-10-CM | POA: Diagnosis not present

## 2015-10-24 DIAGNOSIS — M7662 Achilles tendinitis, left leg: Secondary | ICD-10-CM

## 2015-10-24 HISTORY — DX: Achilles tendinitis, right leg: M76.61

## 2015-10-24 MED ORDER — MELOXICAM 15 MG PO TABS: 15.0000 mg | ORAL_TABLET | Freq: Every day | ORAL | Status: DC

## 2015-10-24 MED ORDER — METHYLPREDNISOLONE 4 MG PO TBPK: ORAL_TABLET | ORAL | Status: DC

## 2015-10-24 NOTE — Progress Notes (Signed)
Patient ID: Erica LassRebecca A Boyson, female   DOB: 11/30/1965, 50 y.o.   MRN: [id number]  Subjective: 50 year old female presents the office today for concerns of swelling to the posterior calcaneus along the Achilles tendon to both feet which is been ongoing for about 2 months. She states that she recently broke her right arm images my therapy for that and she held off on treating her foot because of that. She denies any recent injury. She has been stretching her foot recently as well as icing without much relief. No recent injury. No numbness or tingling. No pain in the bottom of her heels. Denies any systemic complaints such as fevers, chills, nausea, vomiting. No acute changes since last appointment, and no other complaints at this time.   Objective: AAO x3, NAD DP/PT pulses palpable bilaterally, CRT less than 3 seconds There is tenderness palpation on the posterior aspect of the bilateral calcaneus on a prominent retrocalcaneal exostosis bilaterally. There is mild discomfort on the distal portion Achilles tendon on the insertion. There is localized edema bilaterally overlying this area any erythema or increase in warmth. There is no defect noted within Achilles tendon and Thompson test is negative. No pain with medial to lateral compression of the calcaneus. No areas of pinpoint bony tenderness or pain with vibratory sensation. MMT 5/5, ROM WNL. No edema, erythema, increase in warmth to bilateral lower extremities.  No open lesions or pre-ulcerative lesions.  No pain with calf compression, swelling, warmth, erythema  Assessment: Insertional Achilles tendinitis, retrocalcaneal exostosis  Plan: -All treatment options discussed with the patient including all alternatives, risks, complications.  -Recommended night splint for stretching however she declined as well as posterior gel sleeve. -Continue stretching, icing daily. -Prescribed Medrol Dosepak as well as meloxicam. Once the Medrol Dosepak is  completed she can restart the meloxicam but did not together and she verbally understood this. Discussed side effects of each medicine. -I discussed shoe gear modifications in open back shoes -She was requesting a steroid injection to the posterior heels however given the Achilles tendon I would hold off on this. -Patient encouraged to call the office with any questions, concerns, change in symptoms.   Ovid CurdMatthew Bowie Doiron, DPM

## 2015-10-24 NOTE — Patient Instructions (Signed)

## 2015-10-31 ENCOUNTER — Other Ambulatory Visit: Payer: Self-pay | Admitting: Internal Medicine

## 2015-10-31 DIAGNOSIS — Z1231 Encounter for screening mammogram for malignant neoplasm of breast: Secondary | ICD-10-CM

## 2015-11-07 ENCOUNTER — Encounter: Payer: Self-pay | Admitting: Internal Medicine

## 2015-11-14 ENCOUNTER — Ambulatory Visit: Payer: BC Managed Care – PPO | Admitting: Primary Care

## 2015-11-14 ENCOUNTER — Ambulatory Visit (INDEPENDENT_AMBULATORY_CARE_PROVIDER_SITE_OTHER): Payer: BC Managed Care – PPO | Admitting: Podiatry

## 2015-11-14 ENCOUNTER — Encounter: Payer: Self-pay | Admitting: Podiatry

## 2015-11-14 DIAGNOSIS — M7661 Achilles tendinitis, right leg: Secondary | ICD-10-CM

## 2015-11-14 DIAGNOSIS — M7662 Achilles tendinitis, left leg: Secondary | ICD-10-CM

## 2015-11-14 DIAGNOSIS — Z0289 Encounter for other administrative examinations: Secondary | ICD-10-CM

## 2015-11-14 DIAGNOSIS — M722 Plantar fascial fibromatosis: Secondary | ICD-10-CM

## 2015-11-14 NOTE — Progress Notes (Signed)
Patient ID: [name], female   DOB: 28-Sep-1965, 50 y.o.   MRN: [id number]  Subjective: 50 year old female presents the office they for proper ventilation bilateral Achilles tendinitis. She states the swelling has gone down however she still gets some occasional discomfort in that area. She also started to have recurrence of pain to the bottom of her heel since last appointment. She states it feels the same as it did previously with plantar fasciitis. No recent injury or trauma. She states the pain is intermittent in nature. Throbbing sensation.Denies any systemic complaints such as fevers, chills, nausea, vomiting. No acute changes since last appointment, and no other complaints at this time.   Objective: AAO x3, NAD DP/PT pulses palpable bilaterally, CRT less than 3 seconds Tenderness to palpation along the plantar medial tubercle of the calcaneus at the insertion of plantar fascia on the left and right foot. There is no pain along the course of the plantar fascia within the arch of the foot. Plantar fascia appears to be intact. There is no pain with lateral compression of the calcaneus or pain with vibratory sensation.  There is mild pain along the insertion of the Achilles tendon on the calcaneus and mild swelling still present. Exostosis is probable. No erythema or increase in warmth. No areas of pinpoint bony tenderness or pain with vibratory sensation. MMT 5/5, ROM WNL. No edema, erythema, increase in warmth to bilateral lower extremities.  No open lesions or pre-ulcerative lesions.  No pain with calf compression, swelling, warmth, erythema  Assessment: Achilles tendinitis, plantar fasciitis bilaterally  Plan: -All treatment options discussed with the patient including all alternatives, risks, complications.  -Patient elects to proceed with steroid injection into the left and right plantar heel. Under sterile skin preparation, a total of 2.5cc of kenalog 10, 0.5% Marcaine plain, and  2% lidocaine plain were infiltrated into the symptomatic area without complication. A band-aid was applied. Patient tolerated the injection well without complication. Post-injection care with discussed with the patient. Discussed with the patient to ice the area over the next couple of days to help prevent a steroid flare.  -Continue stretching, icing exercises daily. -Meloxicam as needed. -She was [facility] -Follow-up in 4 weeks or sooner if any issues are to arise. Call any questions or concerns in the meantime.  Ovid CurdMatthew Zymarion Favorite, DPM  -Patient encouraged to call the office with any questions, concerns, change in symptoms.

## 2015-11-15 ENCOUNTER — Ambulatory Visit
Admission: RE | Admit: 2015-11-15 | Discharge: 2015-11-15 | Disposition: A | Discharge status: Home / Self Care | Payer: BC Managed Care – PPO | Source: Ambulatory Visit | Attending: Internal Medicine | Admitting: Internal Medicine

## 2015-11-15 ENCOUNTER — Other Ambulatory Visit: Payer: Self-pay | Admitting: Internal Medicine

## 2015-11-15 DIAGNOSIS — Z1231 Encounter for screening mammogram for malignant neoplasm of breast: Secondary | ICD-10-CM | POA: Diagnosis present

## 2015-11-23 ENCOUNTER — Ambulatory Visit (AMBULATORY_SURGERY_CENTER): Payer: Self-pay | Admitting: *Deleted

## 2015-11-23 ENCOUNTER — Encounter: Payer: Self-pay | Admitting: *Deleted

## 2015-11-23 VITALS — Ht 67.5 in | Wt 206.0 lb

## 2015-11-23 DIAGNOSIS — Z1211 Encounter for screening for malignant neoplasm of colon: Secondary | ICD-10-CM

## 2015-11-23 NOTE — Progress Notes (Signed)
No egg or soy allergy known to patient  No issues with past sedation with any surgeries  or procedures, no intubation problems  No diet pills per patient No home 02 use per patient  No blood thinners per patient  Pt states occasional  issues with constipation but nothing chronic emmi video declined

## 2015-11-24 ENCOUNTER — Ambulatory Visit (INDEPENDENT_AMBULATORY_CARE_PROVIDER_SITE_OTHER): Payer: BC Managed Care – PPO | Admitting: Primary Care

## 2015-11-24 ENCOUNTER — Encounter: Payer: Self-pay | Admitting: Primary Care

## 2015-11-24 VITALS — BP 118/76 | HR 65 | Temp 97.4°F | Ht 68.0 in | Wt 206.8 lb

## 2015-11-24 DIAGNOSIS — I10 Essential (primary) hypertension: Secondary | ICD-10-CM | POA: Diagnosis not present

## 2015-11-24 DIAGNOSIS — M722 Plantar fascial fibromatosis: Secondary | ICD-10-CM | POA: Diagnosis not present

## 2015-11-24 NOTE — Progress Notes (Signed)
Pre visit review using our clinic review tool, if applicable. No additional management support is needed unless otherwise documented below in the visit note. 

## 2015-11-24 NOTE — Patient Instructions (Signed)
Please discuss your lower abdominal discomfort next week during your colonoscopy.  Please schedule a physical with me in the near future. You may also schedule a lab only appointment 3-4 days prior. We will discuss your lab results in detail during your physical.  It was a pleasure to meet you today! Please don't hesitate to call me with any questions. [facility]!

## 2015-11-24 NOTE — Assessment & Plan Note (Signed)
Currently managed on hydrochlorothiazide 25 mg and lisinopril 10 mg. Blood pressure stable in the clinic today, continue current regimen.

## 2015-11-24 NOTE — Assessment & Plan Note (Signed)
Follows with podiatry for chronic plantar fasciitis. Managed on daily meloxicam for now.

## 2015-11-24 NOTE — Progress Notes (Signed)
Subjective:    Patient ID: [name], female    DOB: 22-Feb-1966, 50 y.o.   MRN: [id number]  HPI  [name] is a 50 year old female who presents today [facility].   1) Essential Hypertension: Diagnosed several years ago. Currently managed on Lisinopril 10 mg and HCTZ 25 mg. Her BP is stable in the clinic today. She does not check her BP routinely. Denies chest pain, headaches, dizziness.   2) Abdominal Pain: Located to the right groin/pelvic region. She will experience this when feeling gassy or needs to complete a bowel movement. She will experience this discomfort every 2 days which will dissipate after she passes gas or completes a bowel movement.   Her discomfort will wake her from sleep. She completes bowel movements 1-2 times daily. She will take Miralax as needed for constipation, no bowel movement after 3 days. Denies nausea, vomiting. She will be completing a colonoscopy next week. She has a history of ovarian cysts and has had a complete hysterectomy. This discomfort does feel similar and was told that she had adhesions from hysterectomy.  3) Plantar Fasciitis: History of for the past 10 years. She takes the Meloxicam once daily and has also completed cortisone injections. She follows with Dr. Ardelle AntonWagoner with Podiatry.   Review of Systems  Constitutional: Negative for fever.  Respiratory: Negative for cough and shortness of breath.   Cardiovascular: Negative for chest pain.  Gastrointestinal: Negative for nausea, vomiting, diarrhea, constipation and abdominal distention.       Right lower quadrant/groin pain.  Neurological: Negative for dizziness and headaches.       Past Medical History  Diagnosis Date  . Depression   . Heart murmur   . Hypertension   . Anemia   . History of genital warts   . History of chicken pox   . H/O scarlet fever   . Shingles   . Rash due to allergy     Wheat allergy.  . Plantar fasciitis      Social  History   Social History  . Marital Status: Married    Spouse Name: N/A  . Number of Children: 2  . Years of Education: 18   Occupational History  . High School Teacher    Social History Main Topics  . Smoking status: Never Smoker   . Smokeless tobacco: Never Used  . Alcohol Use: 3.6 oz/week    6 Glasses of wine per week  . Drug Use: No  . Sexual Activity: Not on file   Other Topics Concern  . Not on file   Social History Narrative   Married.   2 children.   She teaches high school.   Enjoys Clinical cytogeneticistcrafting.     Past Surgical History  Procedure Laterality Date  . Abdominal hysterectomy  2005  . Knee surgery Left     2015  . Vaginal delivery      x2    Family History  Problem Relation Age of Onset  . Adopted: Yes  . Hyperlipidemia Mother   . Hypertension Mother   . Colon polyps Mother   . Stroke Maternal Grandmother   . Colon cancer Neg Hx   . Esophageal cancer Neg Hx   . Rectal cancer Neg Hx   . Stomach cancer Neg Hx     No Known Allergies  Current Outpatient Prescriptions on File Prior to Visit  Medication Sig Dispense Refill  . aspirin 81 MG tablet Take 81  mg by mouth daily.    . hydrochlorothiazide (HYDRODIURIL) 25 MG tablet TAKE 1 TABLET(25 MG) BY MOUTH DAILY 90 tablet 0  . lisinopril (PRINIVIL,ZESTRIL) 10 MG tablet Take 1 tablet (10 mg total) by mouth daily. 30 tablet 5  . meloxicam (MOBIC) 15 MG tablet Take 1 tablet (15 mg total) by mouth daily. 30 tablet 0  . OVER THE COUNTER MEDICATION 1 tablet daily. Tumric    . desonide (DESOWEN) 0.05 % ointment Apply 1 application topically daily. Do not use more than 3-5 days. (Patient not taking: Reported on 11/23/2015) 15 g 0  . Lorcaserin HCl 10 MG TABS Take 1 tablet twice a day (Patient not taking: Reported on 11/23/2015) 60 tablet 3   No current facility-administered medications on file prior to visit.    BP 118/76 mmHg  Pulse 65  Temp(Src) 97.4 F (36.3 C) (Oral)  Ht 5\' 8"  (1.727 m)  Wt 206 lb 12.8 oz  (93.804 kg)  BMI 31.45 kg/m2  SpO2 98%    Objective:   Physical Exam  Constitutional: She appears well-nourished.  Neck: Neck supple.  Cardiovascular: Normal rate and regular rhythm.   Pulmonary/Chest: Effort normal and breath sounds normal.  Abdominal: Soft. Bowel sounds are normal. There is no tenderness.  Skin: Skin is warm and dry.  Psychiatric: She has a normal mood and affect.          Assessment & Plan:  Abdominal/pelvic pain:  Located to right lower abdomen/groin for years. Improved with bowel movements and movement of flatulence. Going for colonoscopy next week. Exam today without evidence of acute process. She does not appear ill or sickly. Could be adhesions from hysterectomy, could also be right lower colon as she has relief with movement of bowel and gas. We'll continue to monitor.  Morrie Sheldonlark,Kyrstal Monterrosa Kendal, NP

## 2015-11-27 ENCOUNTER — Encounter: Payer: Self-pay | Admitting: Internal Medicine

## 2015-12-04 ENCOUNTER — Ambulatory Visit (AMBULATORY_SURGERY_CENTER): Payer: BC Managed Care – PPO | Admitting: Internal Medicine

## 2015-12-04 ENCOUNTER — Encounter: Payer: Self-pay | Admitting: Internal Medicine

## 2015-12-04 VITALS — BP 122/72 | HR 69 | Temp 99.8°F | Resp 11 | Ht 67.5 in | Wt 206.0 lb

## 2015-12-04 DIAGNOSIS — Z1211 Encounter for screening for malignant neoplasm of colon: Secondary | ICD-10-CM | POA: Diagnosis present

## 2015-12-04 MED ORDER — SODIUM CHLORIDE 0.9 % IV SOLN: 500.0000 mL | INTRAVENOUS | Status: DC

## 2015-12-04 NOTE — Op Note (Signed)
[facility] Patient Name: [name] Procedure Date: 12/04/2015 1:33 PM MRN: [id number] Endoscopist: Iva Boop , MD Age: 50 Referring MD:  Date of Birth: [birth year] Gender: Female Account #: [id number] Procedure:                Colonoscopy Indications:              Screening for colorectal malignant neoplasm Medicines:                Propofol per Anesthesia, Monitored Anesthesia Care Procedure:                Pre-Anesthesia Assessment:                           - Prior to the procedure, a History and Physical                            was performed, and patient medications and                            allergies were reviewed. The patient's tolerance of                            previous anesthesia was also reviewed. The risks                            and benefits of the procedure and the sedation                            options and risks were discussed with the patient.                            All questions were answered, and informed consent                            was obtained. Prior Anticoagulants: The patient has                            taken no previous anticoagulant or antiplatelet                            agents. ASA Grade Assessment: II - A patient with                            mild systemic disease. After reviewing the risks                            and benefits, the patient was deemed in                            satisfactory condition to undergo the procedure.                           After obtaining informed consent, the colonoscope  was passed under direct vision. Throughout the                            procedure, the patient's blood pressure, pulse, and                            oxygen saturations were monitored continuously. The                            Model PCF-H190L [id number] [id number]) scope was introduced                            through the anus and advanced to the the cecum,           identified by appendiceal orifice and ileocecal                            valve. The quality of the bowel preparation was                            excellent. The colonoscopy was performed without                            difficulty. The patient tolerated the procedure                            well. The bowel preparation used was Miralax. The                            ileocecal valve, appendiceal orifice, and rectum                            were photographed. Scope In: 1:51:52 PM Scope Out: 2:02:48 PM Total Procedure Duration: 0 hours 10 minutes 56 seconds  Findings:                 The perianal and digital rectal examinations were                            normal.                           The colon (entire examined portion) appeared normal.                           No additional abnormalities were found on                            retroflexion. Complications:            No immediate complications. Estimated blood loss:                            None. Estimated Blood Loss:     Estimated blood loss: none. Recommendation:           - Repeat colonoscopy in 10 years for screening  purposes.                           - Patient has a contact number available for                            emergencies. The signs and symptoms of potential                            delayed complications were discussed with the                            patient. Return to normal activities tomorrow.                            Written discharge instructions were provided to the                            patient.                           - Resume previous diet.                           - Continue present medications. Iva Boop, MD 12/04/2015 2:07:28 PM This report has been signed electronically.

## 2015-12-04 NOTE — Progress Notes (Signed)
Report to PACU, RN, vss, BBS= Clear.  

## 2015-12-04 NOTE — Patient Instructions (Addendum)
No polyps or cancer seen - Normal!  Next routine colonoscopy/screening test  in 10 years - 2027  I appreciate the opportunity to care for you. Iva Booparl E. Forrester Blando, MD, FACG  YOU HAD AN ENDOSCOPIC PROCEDURE TODAY AT THE [facility]:   Refer to the procedure report that was given to you for any specific questions about what was found during the examination.  If the procedure report does not answer your questions, please call your gastroenterologist to clarify.  If you requested that your care partner not be given the details of your procedure findings, then the procedure report has been included in a sealed envelope for you to review at your convenience later.  YOU SHOULD EXPECT: Some feelings of bloating in the abdomen. Passage of more gas than usual.  Walking can help get rid of the air that was put into your GI tract during the procedure and reduce the bloating. If you had a lower endoscopy (such as a colonoscopy or flexible sigmoidoscopy) you may notice spotting of blood in your stool or on the toilet paper. If you underwent a bowel prep for your procedure, you may not have a normal bowel movement for a few days.  Please Note:  You might notice some irritation and congestion in your nose or some drainage.  This is from the oxygen used during your procedure.  There is no need for concern and it should clear up in a day or so.  SYMPTOMS TO REPORT IMMEDIATELY:   Following lower endoscopy (colonoscopy or flexible sigmoidoscopy):  Excessive amounts of blood in the stool  Significant tenderness or worsening of abdominal pains  Swelling of the abdomen that is new, acute  Fever of 100F or higher   For urgent or emergent issues, a gastroenterologist can be reached at any hour by calling [phone].   DIET: Your first meal following the procedure should be a small meal and then it is ok to progress to your normal diet. Heavy or fried foods are harder to digest and may make  you feel nauseous or bloated.  Likewise, meals heavy in dairy and vegetables can increase bloating.  Drink plenty of fluids but you should avoid alcoholic beverages for 24 hours.  ACTIVITY:  You should plan to take it easy for the rest of today and you should NOT DRIVE or use heavy machinery until tomorrow (because of the sedation medicines used during the test).    FOLLOW UP: Our staff will call the number listed on your records the next business day following your procedure to check on you and address any questions or concerns that you may have regarding the information given to you following your procedure. If we do not reach you, we will leave a message.  However, if you are feeling well and you are not experiencing any problems, there is no need to return our call.  We will assume that you have returned to your regular daily activities without incident.  If any biopsies were taken you will be contacted by phone or by letter within the next 1-3 weeks.  Please call us at [phone] if you have not heard about the biopsies in 3 weeks.    SIGNATURES/CONFIDENTIALITY: You and/or your care partner have signed paperwork which will be entered into your electronic medical record.  These signatures attest to the fact that that the information above on your After Visit Summary has been reviewed and is understood.  Full responsibility of the confidentiality of  this discharge information lies with you and/or your care-partner.

## 2015-12-05 ENCOUNTER — Telehealth: Payer: Self-pay

## 2015-12-05 NOTE — Telephone Encounter (Signed)
  Follow up Call-  Call back number 12/04/2015  Post procedure Call Back phone  # [phone]  Permission to leave phone message Yes     Patient questions:  Do you have a fever, pain , or abdominal swelling? No. Pain Score  0 *  Have you tolerated food without any problems? Yes.    Have you been able to return to your normal activities? Yes.    Do you have any questions about your discharge instructions: Diet   No. Medications  No. Follow up visit  No.  Do you have questions or concerns about your Care? No.  Actions: * If pain score is 4 or above: No action needed, pain <4.

## 2015-12-12 ENCOUNTER — Ambulatory Visit (INDEPENDENT_AMBULATORY_CARE_PROVIDER_SITE_OTHER): Payer: BC Managed Care – PPO | Admitting: Podiatry

## 2015-12-12 ENCOUNTER — Encounter: Payer: Self-pay | Admitting: Podiatry

## 2015-12-12 DIAGNOSIS — M7662 Achilles tendinitis, left leg: Secondary | ICD-10-CM | POA: Diagnosis not present

## 2015-12-12 DIAGNOSIS — M7730 Calcaneal spur, unspecified foot: Secondary | ICD-10-CM

## 2015-12-12 DIAGNOSIS — M722 Plantar fascial fibromatosis: Secondary | ICD-10-CM | POA: Diagnosis not present

## 2015-12-12 DIAGNOSIS — M7661 Achilles tendinitis, right leg: Secondary | ICD-10-CM | POA: Diagnosis not present

## 2015-12-12 DIAGNOSIS — M779 Enthesopathy, unspecified: Secondary | ICD-10-CM

## 2015-12-12 MED ORDER — MELOXICAM 15 MG PO TABS: 15.0000 mg | ORAL_TABLET | Freq: Every day | ORAL | 0 refills | Status: DC

## 2015-12-12 NOTE — Patient Instructions (Signed)

## 2015-12-13 NOTE — Progress Notes (Signed)
Patient ID: [name], female   DOB: Jul 27, 1965, 50 y.o.   MRN: [id number]  Subjective: 50 year old female presents the office today for follow-up evaluation of plantar fasciitis, Achilles tendinitis. She states that she is still getting quite a bit of pain to the back of her right heel she points to bone spurs on the posterior aspect of the right heel. She states that when she gets out of the car after state for quite some time she gets pain she has pain now more consistently. She denies any recent injury or trauma. No redness or warmth. She also presents a pickup orthotics. She does that the pain to the bottom of her heel has improved.  Objective: AAO x3, NAD DP/PT pulses palpable bilaterally, CRT less than 3 seconds At this time there is no tenderness palpation along the plantar medial tubercle of the calcaneus at the insertion plantar fascial bilaterally. No pain within the arch of the foot. There does appear to be quite a bit of tenderness on the right posterior heel greater than left along 2 areas on both the medial and lateral aspects of the posterior calcaneus on the prominent retrocalcaneal exostosis/bone spur. Minimal tenderness the actual Achilles tendon. There is faint edema to this area any erythema or increase in warmth. Equinus is present. There is no defect noted in the Achilles tendon bilaterally and Thompson test negative. No other areas of tenderness bilateral. No pain with calf compression, swelling, warmth, erythema  Assessment: Achilles tendinitis/heel spur right >> left  Plan: -All treatment options discussed with the patient including all alternatives, risks, complications.  -At this point she is having quite a bit of pain to the right side worse than left posterior heel. I discussed further steroid injection to this area as she is requesting it including risks, complications and potential for tearing of the tendon. She understands this risk but given the pain that  should proceed with a. Under sterile conditions today mature dexamethasone and local anesthetic was infiltrated to both the medial and lateral portion of the calcaneus along the area of maximal tenderness. Care was taken not to - inject roughly into the Achilles tendon. She was placed into a cam boot for mobilization. Continue ice and anti-inflammatories. -Orthotics were dispensed a. They appear to be fitting well however hold off on wearing these for right now. -Follow-up as scheduled or sooner if needed. Patient encouraged to call the office with any questions, concerns, change in symptoms.    Ovid Curd, DPM

## 2015-12-19 ENCOUNTER — Telehealth: Payer: Self-pay | Admitting: *Deleted

## 2015-12-19 MED ORDER — MELOXICAM 15 MG PO TABS: 15.0000 mg | ORAL_TABLET | Freq: Every day | ORAL | 2 refills | Status: DC

## 2015-12-19 NOTE — Telephone Encounter (Signed)
Dr. Ardelle Anton ordered pt to continue Meloxicam.  Refilled +2.

## 2015-12-28 ENCOUNTER — Other Ambulatory Visit: Payer: Self-pay | Admitting: Primary Care

## 2015-12-28 DIAGNOSIS — D649 Anemia, unspecified: Secondary | ICD-10-CM

## 2015-12-28 DIAGNOSIS — Z0000 Encounter for general adult medical examination without abnormal findings: Secondary | ICD-10-CM

## 2015-12-28 DIAGNOSIS — I10 Essential (primary) hypertension: Secondary | ICD-10-CM

## 2016-01-02 ENCOUNTER — Ambulatory Visit (INDEPENDENT_AMBULATORY_CARE_PROVIDER_SITE_OTHER): Payer: BC Managed Care – PPO | Admitting: Podiatry

## 2016-01-02 ENCOUNTER — Other Ambulatory Visit (INDEPENDENT_AMBULATORY_CARE_PROVIDER_SITE_OTHER): Payer: BC Managed Care – PPO

## 2016-01-02 ENCOUNTER — Encounter: Payer: Self-pay | Admitting: Podiatry

## 2016-01-02 DIAGNOSIS — Z0000 Encounter for general adult medical examination without abnormal findings: Secondary | ICD-10-CM

## 2016-01-02 DIAGNOSIS — I10 Essential (primary) hypertension: Secondary | ICD-10-CM

## 2016-01-02 DIAGNOSIS — M7662 Achilles tendinitis, left leg: Secondary | ICD-10-CM

## 2016-01-02 DIAGNOSIS — M779 Enthesopathy, unspecified: Secondary | ICD-10-CM | POA: Diagnosis not present

## 2016-01-02 DIAGNOSIS — D649 Anemia, unspecified: Secondary | ICD-10-CM

## 2016-01-02 DIAGNOSIS — M7661 Achilles tendinitis, right leg: Secondary | ICD-10-CM

## 2016-01-02 LAB — CBC
HCT: 36.5 % (ref 36.0–46.0)
Hemoglobin: 11.8 g/dL — ABNORMAL LOW (ref 12.0–15.0)
MCHC: 32.4 g/dL (ref 30.0–36.0)
MCV: 80.9 fl (ref 78.0–100.0)
Platelets: 309 10*3/uL (ref 150.0–400.0)
RBC: 4.51 Mil/uL (ref 3.87–5.11)
RDW: 15.6 % — ABNORMAL HIGH (ref 11.5–15.5)
WBC: 6.3 10*3/uL (ref 4.0–10.5)

## 2016-01-02 LAB — HEMOGLOBIN A1C: Hgb A1c MFr Bld: 5.9 % (ref 4.6–6.5)

## 2016-01-02 LAB — LIPID PANEL
CHOL/HDL RATIO: 4
CHOLESTEROL: 207 mg/dL — AB (ref 0–200)
HDL: 50 mg/dL (ref >39.00)
LDL CALC: 134 mg/dL — AB (ref 0–99)
NonHDL: 156.8
TRIGLYCERIDES: 114 mg/dL (ref 0.0–149.0)
VLDL: 22.8 mg/dL (ref 0.0–40.0)

## 2016-01-02 LAB — COMPREHENSIVE METABOLIC PANEL
ALT: 30 U/L (ref 0–35)
AST: 29 U/L (ref 0–37)
Albumin: 4 g/dL (ref 3.5–5.2)
Alkaline Phosphatase: 73 U/L (ref 39–117)
BUN: 12 mg/dL (ref 6–23)
CO2: 27 mEq/L (ref 19–32)
Calcium: 9.3 mg/dL (ref 8.4–10.5)
Chloride: 106 mEq/L (ref 96–112)
Creatinine, Ser: 0.74 mg/dL (ref 0.40–1.20)
GFR: 87.85 mL/min (ref >60.00)
Glucose, Bld: 99 mg/dL (ref 70–99)
Potassium: 3.8 mEq/L (ref 3.5–5.1)
Sodium: 139 mEq/L (ref 135–145)
Total Bilirubin: 0.3 mg/dL (ref 0.2–1.2)
Total Protein: 6.8 g/dL (ref 6.0–8.3)

## 2016-01-02 LAB — VITAMIN D 25 HYDROXY (VIT D DEFICIENCY, FRACTURES): VITD: 17.56 ng/mL — ABNORMAL LOW (ref 30.00–100.00)

## 2016-01-07 NOTE — Progress Notes (Signed)
Subjective: 50 year old female presents the office for follow-up evaluation of bilateral posterior heel pain. After the injection lasted limit shearing of the CAM boot and she states that her pain is significantly improved on the right side. She is return to regular shoe and again her pain is decreased. At this time she wishes to proceed with a steroid injection on the left side. Denies any systemic complaints such as fevers, chills, nausea, vomiting. No acute changes since last appointment, and no other complaints at this time.   Objective: AAO x3, NAD DP/PT pulses palpable bilaterally, CRT less than 3 seconds There is significantly improved tenderness to palpation on the posterior aspect of the right Achilles tendon, palms are to create exostosis. Edema, erythema, increase in warmth. There is tenderness to left posterior lateral portion of the calcaneus. There is no pain on the course the Achilles tendon. Thompson test is negative bilaterally. Minimal tenderness palpation to plantar medial tubercle the calcaneus at the insertion of plantar fascial. No pain with the arch of the foot. No other areas of tenderness bilaterally. No edema, erythema, increase in warmth to bilateral lower extremities.  No open lesions or pre-ulcerative lesions.  No pain with calf compression, swelling, warmth, erythema  Assessment: Right posterior heel pain, tendinitis; bone spur  Plan: -All treatment options discussed with the patient including all alternatives, risks, complications.  -At this time she wishes to proceed with a steroid injection to the left posterior heel. Under sterile conditions a mixture of dexamethasone and local anesthetic was infiltrated into the area without couple complications. Care was taken not to inject directly into the Achilles tendinosis side of it. Post injection care was discussed. Remaining CAM boot. Patient in a CAM boot for the next 2 weeks before she starts to transition to regular  shoe. At that time she can certainly go directly shoe with her orthotics. -Ice to the area. -Follow-up 4 weeks or sooner if needed.  Ovid CurdMatthew Wagoner, DPM

## 2016-01-09 ENCOUNTER — Encounter: Payer: Self-pay | Admitting: Primary Care

## 2016-01-09 ENCOUNTER — Ambulatory Visit (INDEPENDENT_AMBULATORY_CARE_PROVIDER_SITE_OTHER): Payer: BC Managed Care – PPO | Admitting: Primary Care

## 2016-01-09 VITALS — BP 124/84 | HR 82 | Temp 98.7°F | Ht 68.0 in | Wt 212.8 lb

## 2016-01-09 DIAGNOSIS — N3946 Mixed incontinence: Secondary | ICD-10-CM

## 2016-01-09 DIAGNOSIS — E785 Hyperlipidemia, unspecified: Secondary | ICD-10-CM

## 2016-01-09 DIAGNOSIS — E669 Obesity, unspecified: Secondary | ICD-10-CM

## 2016-01-09 DIAGNOSIS — Z0000 Encounter for general adult medical examination without abnormal findings: Secondary | ICD-10-CM | POA: Diagnosis not present

## 2016-01-09 DIAGNOSIS — I10 Essential (primary) hypertension: Secondary | ICD-10-CM | POA: Diagnosis not present

## 2016-01-09 DIAGNOSIS — Z0001 Encounter for general adult medical examination with abnormal findings: Secondary | ICD-10-CM | POA: Insufficient documentation

## 2016-01-09 DIAGNOSIS — D509 Iron deficiency anemia, unspecified: Secondary | ICD-10-CM

## 2016-01-09 DIAGNOSIS — R7303 Prediabetes: Secondary | ICD-10-CM

## 2016-01-09 DIAGNOSIS — R32 Unspecified urinary incontinence: Secondary | ICD-10-CM | POA: Insufficient documentation

## 2016-01-09 NOTE — Assessment & Plan Note (Addendum)
A1c is 5.9 on recent labs. Overall fair diet but does not exercise. Discussed risk of development of diabetes. She will start exercising once weekly and increase frequency from there. Repeat A1c in 6 months.

## 2016-01-09 NOTE — Patient Instructions (Signed)
Your cholesterol is slightly too high. Work to reduce processed carbohydrates and junk food. Increase consumption of fresh fruits and vegetables, lean protein, water.  Start exercising. You should be getting 1 hour of moderate intensity exercise 3-5 days weekly.  Your vitamin D level is too low. Start taking Vitamin D 1000 unit capsules. Take 2 capsules by mouth once daily for 3 months.  Follow up in 6 months for repeat labs and follow up. Ensure you come fasting to this appointment as we will repeat your cholesterol levels.  It was a pleasure to see you today!   High Cholesterol High cholesterol refers to having a high level of cholesterol in your blood. Cholesterol is a white, waxy, fat-like protein that your body needs in small amounts. Your liver makes all the cholesterol you need. Excess cholesterol comes from the food you eat. Cholesterol travels in your bloodstream through your blood vessels. If you have high cholesterol, deposits (plaque) may build up on the walls of your blood vessels. This makes the arteries narrower and stiffer. Plaque increases your risk of heart attack and stroke. Work with your health care provider to keep your cholesterol levels in a healthy range. RISK FACTORS Several things can make you more likely to have high cholesterol. These include:   Eating foods high in animal fat (saturated fat) or cholesterol.  Being overweight.  Not getting enough exercise.  Having a family history of high cholesterol. SIGNS AND SYMPTOMS High cholesterol does not cause symptoms. DIAGNOSIS  Your health care provider can do a blood test to check whether you have high cholesterol. If you are older than 20, your health care provider may check your cholesterol every 4-6 years. You may be checked more often if you already have high cholesterol or other risk factors for heart disease. The blood test for cholesterol measures the following:  Bad cholesterol (LDL cholesterol). This is  the type of cholesterol that causes heart disease. This number should be less than 100.  Good cholesterol (HDL cholesterol). This type helps protect against heart disease. A healthy level of HDL cholesterol is 60 or higher.  Total cholesterol. This is the combined number of LDL cholesterol and HDL cholesterol. A healthy number is less than 200. TREATMENT  High cholesterol can be treated with diet changes, lifestyle changes, and medicine.   Diet changes may include eating more whole grains, fruits, vegetables, nuts, and fish. You may also have to cut back on red meat and foods with a lot of added sugar.  Lifestyle changes may include getting at least 40 minutes of aerobic exercise three times a week. Aerobic exercises include walking, biking, and swimming. Aerobic exercise along with a healthy diet can help you maintain a healthy weight. Lifestyle changes may also include quitting smoking.  If diet and lifestyle changes are not enough to lower your cholesterol, your health care provider may prescribe a statin medicine. This medicine has been shown to lower cholesterol and also lower the risk of heart disease. HOME CARE INSTRUCTIONS  Only take over-the-counter or prescription medicines as directed by your health care provider.   Follow a healthy diet as directed by your health care provider. For instance:   Eat chicken (without skin), fish, veal, shellfish, ground Malaw[place]key breast, and round or loin cuts of red meat.  Do not eat fried foods and fatty meats, such as hot dogs and salami.   Eat plenty of fruits, such as apples.   Eat plenty of vegetables, such as broccoli, potatoes,  and carrots.   Eat beans, peas, and lentils.   Eat grains, such as barley, rice, couscous, and bulgur wheat.   Eat pasta without cream sauces.   Use skim or nonfat milk and low-fat or nonfat yogurt and cheeses. Do not eat or drink whole milk, cream, ice cream, egg yolks, and hard cheeses.   Do not eat  stick margarine or tub margarines that contain trans fats (also called partially hydrogenated oils).   Do not eat cakes, cookies, crackers, or other baked goods that contain trans fats.   Do not eat saturated tropical oils, such as coconut and palm oil.   Exercise as directed by your health care provider. Increase your activity level with activities such as gardening or walking.   Keep all follow-up appointments.  SEEK MEDICAL CARE IF:  You are struggling to maintain a healthy diet or weight.  You need help starting an exercise program.  You need help to stop smoking. SEEK IMMEDIATE MEDICAL CARE IF:  You have chest pain.  You have trouble breathing.   This information is not intended to replace advice given to you by your health care provider. Make sure you discuss any questions you have with your health care provider.   Document Released: 05/06/2005 Document Revised: 05/27/2014 Document Reviewed: 02/26/2013 Elsevier Interactive Patient Education [employer]2016 Elsevier Inc.

## 2016-01-09 NOTE — Assessment & Plan Note (Signed)
History of for several years. Hysterectomy. Has tried pessary which caused discomfort and no improvement. Long discussion today regarding treatment options. She will think about options and notify me of her decision. Consider low-dose Ditropan.

## 2016-01-09 NOTE — Assessment & Plan Note (Signed)
Stable in the clinic today. Continue lisinopril 10 mg and hydrochlorothiazide 25 mg.

## 2016-01-09 NOTE — Progress Notes (Signed)
Subjective:    Patient ID: [name], female    DOB: 07-06-1965, 50 y.o.   MRN: [id number]  HPI  [name] is a 50 year old female who presents today for complete physical.  Immunizations: -Tetanus: Completed in 2015 -Influenza: Did not receive last season  Diet: She endorses a fair diet. Breakfast: Smoothie, omelette  Lunch: Left overs Dinner: Beans, rice, str-fry, fish, quinoa Snacks: Chips, popcorn, nuts Desserts: None Beverages: Water, hot tea  Exercise: She does not currently exercise Eye exam: Completed in August 2017. Dental exam: Completes semi-annually. Colonoscopy: Completed in July 2017, normal. Pap Smear: Hysterectomy  Mammogram: Completed in June 2017, normal   Review of Systems  Constitutional: Negative for unexpected weight change.  HENT: Negative for rhinorrhea.   Respiratory: Negative for cough and shortness of breath.   Cardiovascular: Negative for chest pain.  Gastrointestinal: Negative for constipation and diarrhea.  Genitourinary: Negative for difficulty urinating.       Some urinary incontinence.   Musculoskeletal: Negative for arthralgias and myalgias.  Skin: Negative for rash.  Allergic/Immunologic: Positive for environmental allergies.  Neurological: Negative for dizziness, numbness and headaches.  Psychiatric/Behavioral:       Denies concerns for anxiety and depression       Past Medical History:  Diagnosis Date  . Anemia   . Depression   . H/O scarlet fever   . Heart murmur   . History of chicken pox   . History of genital warts   . Hypertension   . Plantar fasciitis   . Rash due to allergy    Wheat allergy.  . Shingles      Social History   Social History  . Marital status: Married    Spouse name: N/A  . Number of children: 2  . Years of education: 44   Occupational History  . High School Teacher    Social History Main Topics  . Smoking status: Never Smoker  . Smokeless tobacco: Never Used  . Alcohol use  3.6 oz/week    6 Glasses of wine per week  . Drug use: No  . Sexual activity: Not on file   Other Topics Concern  . Not on file   Social History Narrative   Married.   2 children.   She teaches high school.   Enjoys Clinical cytogeneticist.     Past Surgical History:  Procedure Laterality Date  . ABDOMINAL HYSTERECTOMY  2005  . KNEE SURGERY Left    2015  . VAGINAL DELIVERY     x2    Family History  Problem Relation Age of Onset  . Adopted: Yes  . Hyperlipidemia Mother   . Hypertension Mother   . Colon polyps Mother   . Stroke Maternal Grandmother   . Colon cancer Neg Hx   . Esophageal cancer Neg Hx   . Rectal cancer Neg Hx   . Stomach cancer Neg Hx     No Known Allergies  Current Outpatient Prescriptions on File Prior to Visit  Medication Sig Dispense Refill  . aspirin 81 MG tablet Take 81 mg by mouth daily.    Marland Kitchen desonide (DESOWEN) 0.05 % ointment Apply 1 application topically daily. Do not use more than 3-5 days. 15 g 0  . hydrochlorothiazide (HYDRODIURIL) 25 MG tablet TAKE 1 TABLET(25 MG) BY MOUTH DAILY 90 tablet 0  . lisinopril (PRINIVIL,ZESTRIL) 10 MG tablet Take 1 tablet (10 mg total) by mouth daily. 30 tablet 5  . meloxicam (MOBIC) 15 MG tablet  Take 1 tablet (15 mg total) by mouth daily. 30 tablet 2  . OVER THE COUNTER MEDICATION 1 tablet daily. Tumric     No current facility-administered medications on file prior to visit.     BP 124/84   Pulse 82   Temp 98.7 F (37.1 C) (Oral)   Ht 5\' 8"  (1.727 m)   Wt 212 lb 12.8 oz (96.5 kg)   SpO2 97%   BMI 32.36 kg/m    Objective:   Physical Exam  Constitutional: She is oriented to person, place, and time. She appears well-nourished.  HENT:  Right Ear: Tympanic membrane and ear canal normal.  Left Ear: Tympanic membrane and ear canal normal.  Nose: Nose normal.  Mouth/Throat: Oropharynx is clear and moist.  Eyes: Conjunctivae and EOM are normal. Pupils are equal, round, and reactive to light.  Neck: Neck supple. No  thyromegaly present.  Cardiovascular: Normal rate and regular rhythm.   No murmur heard. Pulmonary/Chest: Effort normal and breath sounds normal. She has no rales.  Abdominal: Soft. Bowel sounds are normal. There is no tenderness.  Musculoskeletal: Normal range of motion.  Lymphadenopathy:    She has no cervical adenopathy.  Neurological: She is alert and oriented to person, place, and time. She has normal reflexes. No cranial nerve deficit.  Skin: Skin is warm and dry. No rash noted.  Psychiatric: She has a normal mood and affect.          Assessment & Plan:

## 2016-01-09 NOTE — Progress Notes (Signed)
Pre visit review using our clinic review tool, if applicable. No additional management support is needed unless otherwise documented below in the visit note. 

## 2016-01-09 NOTE — Assessment & Plan Note (Signed)
CBC stable and without evidence of iron deficiency anemia.

## 2016-01-09 NOTE — Assessment & Plan Note (Signed)
Discussed importance of weight loss especially given prediabetes and hyperlipidemia. She will start exercising at least 1 day a week and increase from there. Discussed importance of reduction and junk food, however diet overall is fair.

## 2016-01-09 NOTE — Assessment & Plan Note (Signed)
TC of 207 and LDL of 134. Discussed the importance of a healthy diet and regular exercise in order for weight loss and to reduce risk of other medical diseases. Will recheck lipids in 6 months.

## 2016-01-09 NOTE — Assessment & Plan Note (Signed)
Immunizations up-to-date. Mammogram and colonoscopy up-to-date. Hysterectomy. Exam today unremarkable. Labs with slight elevation in lipids and prediabetes which were discussed. Discussed the importance of a healthy diet and regular exercise in order for weight loss and to reduce risk of other medical diseases. Follow-up in 6 months for reevaluation of lipids and A1c. Follow-up in one year for repeat physical.

## 2016-01-11 ENCOUNTER — Other Ambulatory Visit: Payer: Self-pay | Admitting: Nurse Practitioner

## 2016-01-13 ENCOUNTER — Other Ambulatory Visit: Payer: Self-pay | Admitting: Primary Care

## 2016-02-04 ENCOUNTER — Other Ambulatory Visit: Payer: Self-pay | Admitting: Nurse Practitioner

## 2016-02-04 DIAGNOSIS — I10 Essential (primary) hypertension: Secondary | ICD-10-CM

## 2016-02-05 NOTE — Telephone Encounter (Signed)
Naomie DeanCarrie Doss is no longer at our office. Forwarded refill request to new PCP.

## 2016-05-09 ENCOUNTER — Other Ambulatory Visit: Payer: Self-pay | Admitting: Podiatry

## 2016-05-09 NOTE — Telephone Encounter (Signed)
Pt needs an appt prior to future refills. 

## 2016-05-16 ENCOUNTER — Ambulatory Visit (INDEPENDENT_AMBULATORY_CARE_PROVIDER_SITE_OTHER): Payer: BC Managed Care – PPO | Admitting: Podiatry

## 2016-05-16 ENCOUNTER — Encounter: Payer: Self-pay | Admitting: Podiatry

## 2016-05-16 DIAGNOSIS — M7661 Achilles tendinitis, right leg: Secondary | ICD-10-CM | POA: Diagnosis not present

## 2016-05-16 DIAGNOSIS — M7730 Calcaneal spur, unspecified foot: Secondary | ICD-10-CM

## 2016-05-16 DIAGNOSIS — M7662 Achilles tendinitis, left leg: Secondary | ICD-10-CM | POA: Diagnosis not present

## 2016-05-16 NOTE — Progress Notes (Signed)
Subjective: 50 year old female presents the office for follow-up evaluation of bilateral posterior heel pain. She states the injections help healing positive for shortness time before the pain started to come back. She has been stretching, icing as well as changing shoes as well as immobilization she continues to have pain in the back of her heels. His been ongoing this point for about 2 years. At this time she wishes to proceed and discussed surgical intervention. 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 She states because of this she has not been active over the last couple of years. She wishes to proceed with surgical intervention in the near future to help with this. Denies any systemic complaints such as fevers, chills, nausea, vomiting. No acute changes since last appointment, and no other complaints at this time.   Objective: AAO x3, NAD DP/PT pulses palpable bilaterally, CRT less than 3 seconds There is prominent posteriorly calcaneal spurring present bilaterally. There is tenderness on the posterior aspect of the calcaneus on the prominence. There is also mild discomfort along the distal portion of the Achilles tendon along the insertion of the calcaneus. Thompson test is negative. There is no pain with lateral compression of the calcaneus. There is trace edema along the posterior calcaneus. Equinus is present. There is no other areas of tenderness bilaterally. There is no pain on the course or insertion of the plantar fascia. No open lesions or pre-ulcerative lesions.  No pain with calf compression, swelling, warmth, erythema  Assessment: Bilateral posterior heel pain, tendinitis; bone spur  Plan: -All treatment options discussed with the patient including all alternatives, risks, complications.  -At this time a discussed with her both further conservative as well as  surgical intervention. This time she wishes to proceed with surgical intervention in the next several months. I discussed the surgery with her which included bone spur resection as well as Achilles tendon debridement and gastrocnemius recession. She'll most likely undergo this in June. I given the number of our surgery scheduler call when she gets more definitive date and I'll see her back prior to the surgery. In the meantime continue stretching, icing, supportive shoes, heel lifts and as she has been doing.  Ovid CurdMatthew Wagoner, DPM

## 2016-06-04 ENCOUNTER — Telehealth: Payer: Self-pay | Admitting: *Deleted

## 2016-06-04 NOTE — Telephone Encounter (Signed)
"  I'm trying to schedule surgery with Dr. Ardelle AntonWagoner.  Give me a call back.  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  You can reach me between 11:30 am to 12:15 pm or after 3 pm.  I'm trying to schedule surgery on June 27."

## 2016-06-17 NOTE — Telephone Encounter (Signed)
"  I would like to schedule my surgery with Dr. Ardelle AntonWagoner."  Have you signed consent forms?  "No, I do not think I've done anything."  You will need to see Dr. Ardelle AntonWagoner for a consultation.  "Why, I just saw him.  I been trying to call you to schedule this for about 3 weeks."  I have only received one message from you and that was on January 16.  "I called prior to that but I kept getting a busy signal and wasn't able to leave a message.  I don't understand why I need to see him again."  You have not signed consent forms.  You will need to see him to do so and to get information needed for the surgical center.  "I am trying to do it in June."  If you had signed consent forms in December, you still would have had to come in because your consent form would have expired.  It has to be within 6 months.  So, you will have to schedule an appointment with Dr. Ardelle AntonWagoner for a consultation to sign consent forms.  I can transfer you to a scheduler if you like.  "I'd rather talk to the person I normally talk to in the [place] office.  "You may have spoken to someone from here because they sometime transfer their calls to the [place] office.  "Okay, I'll go ahead and get it scheduled.  Is there a particular time I need to schedule it or is it okay as long as it's done before my surgery date?"  As long as the consult is done before the surgery date, you will be fine.

## 2016-07-05 ENCOUNTER — Ambulatory Visit: Payer: BC Managed Care – PPO | Admitting: Primary Care

## 2016-08-19 ENCOUNTER — Encounter: Payer: Self-pay | Admitting: Podiatry

## 2016-08-19 ENCOUNTER — Ambulatory Visit (INDEPENDENT_AMBULATORY_CARE_PROVIDER_SITE_OTHER): Payer: BC Managed Care – PPO | Admitting: Podiatry

## 2016-08-19 DIAGNOSIS — M9261 Juvenile osteochondrosis of tarsus, right ankle: Secondary | ICD-10-CM

## 2016-08-19 DIAGNOSIS — M7661 Achilles tendinitis, right leg: Secondary | ICD-10-CM

## 2016-08-19 DIAGNOSIS — M7662 Achilles tendinitis, left leg: Secondary | ICD-10-CM

## 2016-08-19 DIAGNOSIS — M216X2 Other acquired deformities of left foot: Secondary | ICD-10-CM | POA: Diagnosis not present

## 2016-08-19 DIAGNOSIS — M216X1 Other acquired deformities of right foot: Secondary | ICD-10-CM | POA: Diagnosis not present

## 2016-08-19 NOTE — Patient Instructions (Signed)

## 2016-08-21 ENCOUNTER — Ambulatory Visit (INDEPENDENT_AMBULATORY_CARE_PROVIDER_SITE_OTHER): Payer: BC Managed Care – PPO | Admitting: Primary Care

## 2016-08-21 ENCOUNTER — Encounter: Payer: Self-pay | Admitting: Primary Care

## 2016-08-21 VITALS — BP 130/84 | HR 90 | Temp 98.3°F | Ht 68.0 in | Wt 205.0 lb

## 2016-08-21 DIAGNOSIS — R7303 Prediabetes: Secondary | ICD-10-CM | POA: Diagnosis not present

## 2016-08-21 DIAGNOSIS — E785 Hyperlipidemia, unspecified: Secondary | ICD-10-CM | POA: Diagnosis not present

## 2016-08-21 DIAGNOSIS — I10 Essential (primary) hypertension: Secondary | ICD-10-CM | POA: Diagnosis not present

## 2016-08-21 DIAGNOSIS — E559 Vitamin D deficiency, unspecified: Secondary | ICD-10-CM

## 2016-08-21 LAB — LIPID PANEL
CHOLESTEROL: 175 mg/dL (ref 0–200)
HDL: 41.5 mg/dL (ref >39.00)
LDL CALC: 119 mg/dL — AB (ref 0–99)
NonHDL: 133.85
Total CHOL/HDL Ratio: 4
Triglycerides: 72 mg/dL (ref 0.0–149.0)
VLDL: 14.4 mg/dL (ref 0.0–40.0)

## 2016-08-21 LAB — VITAMIN D 25 HYDROXY (VIT D DEFICIENCY, FRACTURES): VITD: 24.97 ng/mL — AB (ref 30.00–100.00)

## 2016-08-21 LAB — HEMOGLOBIN A1C: Hgb A1c MFr Bld: 5.9 % (ref 4.6–6.5)

## 2016-08-21 MED ORDER — LISINOPRIL 10 MG PO TABS: ORAL_TABLET | ORAL | 3 refills | Status: DC

## 2016-08-21 MED ORDER — HYDROCHLOROTHIAZIDE 25 MG PO TABS: ORAL_TABLET | ORAL | 3 refills | Status: DC

## 2016-08-21 NOTE — Assessment & Plan Note (Signed)
Stable today, refills provided for lisinopril and HCTZ. BMP on file.

## 2016-08-21 NOTE — Progress Notes (Signed)
Subjective:    Patient ID: [name], female    DOB: [birth year], 51 y.o.   MRN: [id number]  HPI  [name] is a 51 year old female who presents today for follow up.  1) Prediabetes: A1C of 5.9 in August 2017. She was encouraged last visit to work on regular exercise. Since her last visit she's been unable to exercise as she's continuing to experience heel pain secondary to heel spurs. She will be going for surgery in several weeks.   2) Hyperlipidemia: TC and LDL slightly above goal in August 2017. She is fasting today. She is managed on daily aspirin 81 mg.  3) Essential Hypertension: Currently managed on HCTZ 25 and Lisinopril 10 mg. Her BP in the office today is 130/84. She denies chest pain, shortness of breath, dizziness, visual changes.  4) Vitamin D Deficiency: Currently managed on 2000 units daily. Due for vitamin D recheck today.   Review of Systems  Eyes: Negative for visual disturbance.  Respiratory: Negative for shortness of breath.   Cardiovascular: Negative for chest pain.  Neurological: Negative for dizziness and headaches.       Past Medical History:  Diagnosis Date  . Anemia   . Depression   . H/O scarlet fever   . Heart murmur   . History of chicken pox   . History of genital warts   . Hypertension   . Plantar fasciitis   . Rash due to allergy    Wheat allergy.  . Shingles      Social History   Social History  . Marital status: Married    Spouse name: N/A  . Number of children: 2  . Years of education: 66   Occupational History  . High School Teacher    Social History Main Topics  . Smoking status: Never Smoker  . Smokeless tobacco: Never Used  . Alcohol use 3.6 oz/week    6 Glasses of wine per week  . Drug use: No  . Sexual activity: Not on file   Other Topics Concern  . Not on file   Social History Narrative   Married.   2 children.   She teaches high school.   Enjoys Clinical cytogeneticist.     Past Surgical History:  Procedure  Laterality Date  . ABDOMINAL HYSTERECTOMY  2005  . KNEE SURGERY Left    2015  . VAGINAL DELIVERY     x2    Family History  Problem Relation Age of Onset  . Adopted: Yes  . Hyperlipidemia Mother   . Hypertension Mother   . Colon polyps Mother   . Stroke Maternal Grandmother   . Colon cancer Neg Hx   . Esophageal cancer Neg Hx   . Rectal cancer Neg Hx   . Stomach cancer Neg Hx     No Known Allergies  Current Outpatient Prescriptions on File Prior to Visit  Medication Sig Dispense Refill  . aspirin 81 MG tablet Take 81 mg by mouth daily.    Marland Kitchen OVER THE COUNTER MEDICATION 1 tablet daily. Tumric     No current facility-administered medications on file prior to visit.     BP 130/84   Pulse 90   Temp 98.3 F (36.8 C) (Oral)   Ht  (1.727 m)   Wt 205 lb (93 kg)   SpO2 98%   BMI 31.17 kg/m    Objective:   Physical Exam  Constitutional: She appears well-nourished.  Neck: Neck supple.  Cardiovascular: Normal  rate and regular rhythm.   Pulmonary/Chest: Effort normal and breath sounds normal.  Skin: Skin is warm and dry.          Assessment & Plan:

## 2016-08-21 NOTE — Progress Notes (Signed)
Pre visit review using our clinic review tool, if applicable. No additional management support is needed unless otherwise documented below in the visit note. 

## 2016-08-21 NOTE — Assessment & Plan Note (Signed)
Due for recheck today, start regular exercise.

## 2016-08-21 NOTE — Assessment & Plan Note (Signed)
Due for A1C recheck today. Discussed to work on regular exercise.

## 2016-08-21 NOTE — Patient Instructions (Signed)
Complete lab work prior to leaving today. I will notify you of your results once received.   I sent refills of hydrochlorothiazide and lisinopril to your pharmacy.  Follow up in August/September 2018 for your annual physical.  It was a pleasure to see you today!

## 2016-08-21 NOTE — Progress Notes (Signed)
Subjective: 51 year old female presents the office today for surgical consultation and to schedule surgery for the right foot. She states that she is still being pain in the back of the right heel this is limiting her activity and she has difficulty even walking the dog because of the pain to the back of her heel. She has continue with stretching, icing as well as bracing and offloading without any significant improvement this time she wishes to go ahead and proceed with surgical intervention. Denies any systemic complaints such as fevers, chills, nausea, vomiting. No acute changes since last appointment, and no other complaints at this time.   Objective: AAO x3, NAD DP/PT pulses palpable bilaterally, CRT less than 3 seconds There is continued tenderness the posterior aspect of the calcaneus was prominent retrocalcaneal exostosis present bilaterally. Tenderness is greater on the right side and the left side. Equinus is present. There is localized edema and erythema to the posterior calcaneus as well however minimal. Erythema is likely from inflammation from irritation in shoes to the posterior infection. There is no warmth or ascending cellulitis. No open lesions or pre-ulcerative lesions.  No pain with calf compression, swelling, warmth, erythema  Assessment: Symptomatic retrocalcaneal exostosis bilaterally, equinus; right worse than left  Plan: -All treatment options discussed with the patient including all alternatives, risks, complications.  -At this time I discussed both conservative and surgical treatment options the patient. This time she is attended multiple conservative treatment without any improvement and she is requesting surgical intervention. I discussed with her on the right side as this is more symptomatic we will start there. Discussed retrocalcaneal exostectomy, Tenolysis as well as gastrocnemius recession. Discussed with the surgery as well as the postoperative course. -The  incision placement as well as the postoperative course was discussed with the patient. I discussed risks of the surgery which include, but not limited to, infection, bleeding, pain, swelling, need for further surgery, delayed or nonhealing, painful or ugly scar, numbness or sensation changes, over/under correction, recurrence, transfer lesions, further deformity, hardware failure, DVT/PE, loss of toe/foot/leg, tendon rupture. Patient understands these risks and wishes to proceed with surgery. The surgical consent was reviewed with the patient all 3 pages were signed. No promises or guarantees were given to the outcome of the procedure. All questions were answered to the best of my ability. Before the surgery the patient was encouraged to call the office if there is any further questions. The surgery will be performed at the [facility] on an outpatient basis. -Will do lovenox postop  -Patient encouraged to call the office with any questions, concerns, change in symptoms.   Ovid Curd, DPM

## 2016-08-22 ENCOUNTER — Ambulatory Visit: Payer: BC Managed Care – PPO | Admitting: Podiatry

## 2016-09-17 ENCOUNTER — Telehealth: Payer: Self-pay | Admitting: *Deleted

## 2016-09-17 NOTE — Telephone Encounter (Signed)
I am calling to see if you are supposed to have surgery tomorrow.  "No, I'm scheduled for the last Wednesday in June.  Can you check to make sure you have me down?"  Yes, I do have you down for that date.  I wanted to make sure because when I spoke to you previously on the phone we had talked about doing it on May 2.  I do have you scheduled for June 27.

## 2016-09-27 ENCOUNTER — Encounter: Payer: BC Managed Care – PPO | Admitting: Podiatry

## 2016-10-04 ENCOUNTER — Encounter: Payer: BC Managed Care – PPO | Admitting: Podiatry

## 2016-11-11 ENCOUNTER — Telehealth: Payer: Self-pay | Admitting: *Deleted

## 2016-11-11 NOTE — Telephone Encounter (Signed)
"  I am scheduled for surgery on Wednesday with Dr. Ardelle AntonWagoner.  I was looking through my paperwork and it says that I need to get a physical from my primary care physician."  No, you do not need a physical, that only pertains to you if you are having surgery at the hospital.  Your surgery will be done at [facility].  "Okay, when will they call me to tell me my arrival time?"  You should get a call either today or tomorrow.

## 2016-11-13 ENCOUNTER — Other Ambulatory Visit: Payer: Self-pay | Admitting: Podiatry

## 2016-11-13 ENCOUNTER — Encounter: Payer: Self-pay | Admitting: Podiatry

## 2016-11-13 DIAGNOSIS — M216X1 Other acquired deformities of right foot: Secondary | ICD-10-CM | POA: Diagnosis not present

## 2016-11-13 DIAGNOSIS — M7731 Calcaneal spur, right foot: Secondary | ICD-10-CM

## 2016-11-13 DIAGNOSIS — M7661 Achilles tendinitis, right leg: Secondary | ICD-10-CM | POA: Diagnosis not present

## 2016-11-13 MED ORDER — ENOXAPARIN SODIUM 40 MG/0.4ML ~~LOC~~ SOLN: 40.0000 mg | SUBCUTANEOUS | 0 refills | Status: DC

## 2016-11-13 NOTE — Progress Notes (Signed)
Lovenox ordered   Discussed with her and her husband the risks of DVT postop, although small. She agrees to DVT ppx. Discussed options and potential side affects. After discussed lovenox ordered. Will do 2 weeks.   Ovid CurdMatthew Wagoner,

## 2016-11-14 ENCOUNTER — Telehealth: Payer: Self-pay | Admitting: Podiatry

## 2016-11-14 DIAGNOSIS — Z9889 Other specified postprocedural states: Secondary | ICD-10-CM

## 2016-11-14 NOTE — Telephone Encounter (Signed)
Pt asked how she was going to get around. I told pt that Dr. Ardelle AntonWagoner did not want her to bear weight on the surgery foot even in a case, to use crutches or a walker and keep the foot up, and I would order her a knee scooter. I told pt not to have the foot below her heart more than 15 minutes/hour and to put ice pack on the cast or rest cast on the ice pack. Faxed orders for knee scooter to [facility].

## 2016-11-14 NOTE — Telephone Encounter (Signed)
Hello, I had surgery yesterday and I have a few questions about how I'm supposed to get around as well as other questions. If you would, please call me back at [phone]. Thank you.

## 2016-11-15 ENCOUNTER — Telehealth: Payer: Self-pay | Admitting: *Deleted

## 2016-11-15 NOTE — Telephone Encounter (Signed)
-----   Message from Peggye FothergillAngela Trotter sent at 11/15/2016 10:26 AM EDT ----- Regarding: Knee Joaquin BendWalker Hey Neesa [place]ap[place], I received the faxed referral for this pt to get a knee walker as well. Pt was contacted to discuss her copay and she declined at this time. She said that her family member is a PT and she is going to see if she has a knee walker that can be borrowed during this time.   Thanks.  Angie

## 2016-11-15 NOTE — Progress Notes (Signed)
DOS 06.27.2018 Right foot removal of bone spur from back of heel and achilles tendon repair with use of bone anchors/cast application, heel cord lengthening.

## 2016-11-18 ENCOUNTER — Ambulatory Visit (INDEPENDENT_AMBULATORY_CARE_PROVIDER_SITE_OTHER): Payer: BC Managed Care – PPO

## 2016-11-18 ENCOUNTER — Ambulatory Visit (INDEPENDENT_AMBULATORY_CARE_PROVIDER_SITE_OTHER): Payer: BC Managed Care – PPO | Admitting: Podiatry

## 2016-11-18 ENCOUNTER — Encounter: Payer: Self-pay | Admitting: Podiatry

## 2016-11-18 DIAGNOSIS — M7661 Achilles tendinitis, right leg: Secondary | ICD-10-CM

## 2016-11-18 DIAGNOSIS — Z9889 Other specified postprocedural states: Secondary | ICD-10-CM

## 2016-11-18 MED ORDER — MEPERIDINE HCL 50 MG PO TABS: 50.0000 mg | ORAL_TABLET | ORAL | 0 refills | Status: DC | PRN

## 2016-11-19 ENCOUNTER — Other Ambulatory Visit: Payer: Self-pay | Admitting: Primary Care

## 2016-11-19 DIAGNOSIS — Z1231 Encounter for screening mammogram for malignant neoplasm of breast: Secondary | ICD-10-CM

## 2016-11-20 NOTE — Progress Notes (Signed)
   Subjective:  Patient presents today status post right retrocalcaneal exostectomy. DOS: 11/13/16 by Dr. Ardelle AntonWagoner. She states she is doing well. She is supposed to pick up her knee scooter today.    Objective/Physical Exam Skin incisions appear to be well coapted with sutures and staples intact. No sign of infectious process noted. No dehiscence. No active bleeding noted. Moderate edema noted to the surgical extremity.   Assessment: 1. s/p right retrocalcaneal exostectomy. DOS: 11/13/16 by Dr. Ardelle AntonWagoner.   Plan of Care:  1. Patient was evaluated. 2. Refill prescription for Demerol 50 mg given to patient. 3. Return to clinic in 1 week for cast change.   Felecia ShellingBrent M. Oveda Dadamo, DPM [facility]  Dr. Felecia ShellingBrent M. Julita Ozbun, DPM    [address]                                        [place], [place] [id number]                Office [phone]  Fax [phone]

## 2016-11-22 ENCOUNTER — Telehealth: Payer: Self-pay | Admitting: *Deleted

## 2016-11-22 NOTE — Telephone Encounter (Signed)
-----   Message from Peggye FothergillAngela Trotter sent at 11/18/2016 10:53 AM EDT ----- Regarding: RE: Knee [name] Hey Valery, pt called back this morning and she couldn't obtain a knee walker through her family member.   She is going to pick up today before or after her follow up appt. Thanks.   Angie ----- Message ----- From: Peggye Fothergillrotter, Angela Sent: 11/15/2016  10:26 AM To: Marissa NestleValery D O'Connell, RN, Alexis FrockMary A Ozimek Subject: Knee [name]                                    Hey Valery, I received the faxed referral for this pt to get a knee walker as well. Pt was contacted to discuss her copay and she declined at this time. She said that her family member is a PT and she is going to see if she has a knee walker that can be borrowed during this time.   Thanks.  Angie

## 2016-11-25 ENCOUNTER — Ambulatory Visit (INDEPENDENT_AMBULATORY_CARE_PROVIDER_SITE_OTHER): Payer: BC Managed Care – PPO | Admitting: Podiatry

## 2016-11-25 ENCOUNTER — Encounter: Payer: Self-pay | Admitting: Podiatry

## 2016-11-25 DIAGNOSIS — M7661 Achilles tendinitis, right leg: Secondary | ICD-10-CM | POA: Diagnosis not present

## 2016-11-25 DIAGNOSIS — Z9889 Other specified postprocedural states: Secondary | ICD-10-CM

## 2016-11-25 DIAGNOSIS — M7662 Achilles tendinitis, left leg: Secondary | ICD-10-CM

## 2016-11-25 DIAGNOSIS — M7730 Calcaneal spur, unspecified foot: Secondary | ICD-10-CM | POA: Diagnosis not present

## 2016-11-25 DIAGNOSIS — M779 Enthesopathy, unspecified: Secondary | ICD-10-CM | POA: Diagnosis not present

## 2016-11-29 NOTE — Progress Notes (Signed)
Subjective: [name] is a 51 y.o. is seen today in office s/p right retrocalcaneal exostectomy, achilles tenolysis, gastroc recession preformed on 11/13/2016. They state their pain is controlled. She has continue with Lovenox. She has continue nonweightbearing. She is finished her course of antibiotics. Denies any systemic complaints such as fevers, chills, nausea, vomiting. No calf pain, chest pain, shortness of breath.   Objective: General: No acute distress, AAOx3  DP/PT pulses palpable 2/4, CRT < 3 sec to all digits.  Protective sensation intact. Motor function intact.  Cast intact. Right foot: Incision is well coapted without any evidence of dehiscence and sutures/staples are intact. There is no surrounding erythema, ascending cellulitis, fluctuance, crepitus, malodor, drainage/purulence. There is mild edema around the surgical site. There is no pain along the surgical site.  No other areas of tenderness to bilateral lower extremities.  No other open lesions or pre-ulcerative lesions.  No pain with calf compression, swelling, warmth, erythema.   Assessment and Plan:  Status post right foot surgery, doing well with no complications   -Treatment options discussed including all alternatives, risks, and complications -Cast was removed today. Incision appears to be healing well. I did place her to a cam boot today to start some gradual range of motion exercises although gentle. Continue ice and elevation and nonweightbearing at all times. She should wear the CAM boot at all times even while she is sleeping. -Ice/elevation -Pain medication as needed. -Monitor for any clinical signs or symptoms of infection and DVT/PE and directed to call the office immediately should any occur or go to the ER. -Follow-up as scheduled to likely remove the sutures or sooner if any problems arise. In the meantime, encouraged to call the office with any questions, concerns, change in symptoms.   Ovid CurdMatthew  Josedejesus Marcum, DPM

## 2016-12-05 ENCOUNTER — Ambulatory Visit: Payer: BC Managed Care – PPO

## 2016-12-06 ENCOUNTER — Ambulatory Visit (INDEPENDENT_AMBULATORY_CARE_PROVIDER_SITE_OTHER): Payer: BC Managed Care – PPO | Admitting: Podiatry

## 2016-12-06 DIAGNOSIS — M7661 Achilles tendinitis, right leg: Secondary | ICD-10-CM

## 2016-12-06 DIAGNOSIS — M9261 Juvenile osteochondrosis of tarsus, right ankle: Secondary | ICD-10-CM

## 2016-12-06 DIAGNOSIS — M7662 Achilles tendinitis, left leg: Secondary | ICD-10-CM

## 2016-12-06 DIAGNOSIS — Z9889 Other specified postprocedural states: Secondary | ICD-10-CM

## 2016-12-09 NOTE — Progress Notes (Signed)
Subjective: Erica LassRebecca A Velazquez is a 51 y.o. is seen today in office s/p right retrocalcaneal exostectomy, achilles tenolysis, gastroc recession preformed on 11/13/2016. She states that she is doing well she is not taking any pain medication. She is been nonweightbearing. She presents today for possible suture removal. She has no concerns today and overall she states she is doing well other than that her knee hurting from using the scooter as well as the heaviness from the boot.  Denies any systemic complaints such as fevers, chills, nausea, vomiting. No calf pain, chest pain, shortness of breath.   Objective: General: No acute distress, AAOx3  DP/PT pulses palpable 2/4, CRT < 3 sec to all digits.  Protective sensation intact. Motor function intact.  Cast intact. Right foot: Incision is well coapted without any evidence of dehiscence and sutures/staples are intact. There is no surrounding erythema, ascending cellulitis, fluctuance, crepitus, malodor, drainage/purulence. There isminimala around the surgical site. There is no pain along the surgical site. Ankle joint range of motion intact. Thompson test negative and Achilles tendon intact.  No other areas of tenderness to bilateral lower extremities.  No other open lesions or pre-ulcerative lesions.  No pain with calf compression, swelling, warmth, erythema.   Assessment and Plan:  Status post right foot surgery, doing well with no complications   -Treatment options discussed including all alternatives, risks, and complications -Sutures removed the staples remain intact. Antibiotic ointment and a bandage was applied. Keep dressing clean, dry, intact -Gentle range of motion exercises to the ankle. -Cam boot at all times. Remain nonweightbearing at all times. She's having difficulty sleeping sided dispensed a night splint for her to wear only with sleeping but she has not put any weight down. -Ice and elevation -Monitor for any clinical signs or  symptoms of infection and DVT/PE and directed to call the office immediately should any occur or go to the ER. -Follow-up as scheduled to likely remove the sutures or sooner if any problems arise. In the meantime, encouraged to call the office with any questions, concerns, change in symptoms.   Ovid CurdMatthew Wagoner, DPM

## 2016-12-16 ENCOUNTER — Encounter: Payer: Self-pay | Admitting: Podiatry

## 2016-12-16 ENCOUNTER — Ambulatory Visit (INDEPENDENT_AMBULATORY_CARE_PROVIDER_SITE_OTHER): Payer: Self-pay | Admitting: Podiatry

## 2016-12-16 DIAGNOSIS — M7661 Achilles tendinitis, right leg: Secondary | ICD-10-CM

## 2016-12-16 DIAGNOSIS — M7662 Achilles tendinitis, left leg: Secondary | ICD-10-CM

## 2016-12-16 DIAGNOSIS — M9261 Juvenile osteochondrosis of tarsus, right ankle: Secondary | ICD-10-CM

## 2016-12-16 DIAGNOSIS — Z9889 Other specified postprocedural states: Secondary | ICD-10-CM

## 2016-12-16 NOTE — Progress Notes (Signed)
Subjective: [name] is a 51 y.o. is seen today in office s/p right retrocalcaneal exostectomy, achilles tenolysis, gastroc recession preformed on 11/13/2016. She said that she is doing well she's having no pain. She did have one time where she somewhat Filshie clip weight to her right foot and she did have some pain at first but the pain did resolve. She denies any increase in swelling or redness. She states the swelling is actually improved compared to what it was prior to surgery. She is no some small amount of swelling along the insertion and she points the medial aspect adjacent to incision. Denies any redness or warmth.   Denies any systemic complaints such as fevers, chills, nausea, vomiting. No calf pain, chest pain, shortness of breath.   Objective: General: No acute distress, AAOx3  DP/PT pulses palpable 2/4, CRT < 3 sec to all digits.  Protective sensation intact. Motor function intact.  Right foot: Incision is well coapted without any evidence of dehiscence and staples are intact. There is no surrounding erythema, ascending cellulitis, fluctuance, crepitus, malodor, drainage/purulence. There is minimal amount of edema along the surgical site is is mostly just medial to the incision. There is no erythema or increase in warmth. There is no signs of infection present. There is no pain along the surgical site. Ankle joint range of motion intact. Thompson test negative and Achilles tendon intact.  No other areas of tenderness to bilateral lower extremities.  No other open lesions or pre-ulcerative lesions.  No pain with calf compression, swelling, warmth, erythema.   Assessment and Plan:  Status post right foot surgery, doing well with no complications   -Treatment options discussed including all alternatives, risks, and complications -Staples removed to the gastrocnemius recession I left the staples intact to the Achilles tendon repair. Antibiotic ointment and a bandage was applied.  At this point she can shower. Recommend about ointment to the area daily. I left the staples intact as because I want her to start to slowly transition to partial weightbearing before proceeding to full weightbearing in the cam boot. Discussed with her gradually increase her weightbearing status over the next week. I'll see her back in 10 days at that point likely remove the staples and hopefully some physical therapy. I discussed with her how to gradually increase her activity and she understood this. -Continue range of motion exercises to the ankle. -Cam boot at all times.  -Ice and elevation -Monitor for any clinical signs or symptoms of infection and DVT/PE and directed to call the office immediately should any occur or go to the ER. -Follow-up as scheduled to likely remove the sutures or sooner if any problems arise. In the meantime, encouraged to call the office with any questions, concerns, change in symptoms.   Ovid CurdMatthew Wagoner, DPM

## 2016-12-17 ENCOUNTER — Ambulatory Visit
Admission: RE | Admit: 2016-12-17 | Discharge: 2016-12-17 | Disposition: A | Discharge status: Home / Self Care | Payer: BC Managed Care – PPO | Source: Ambulatory Visit | Attending: Primary Care | Admitting: Primary Care

## 2016-12-17 ENCOUNTER — Other Ambulatory Visit: Payer: Self-pay | Admitting: Primary Care

## 2016-12-17 DIAGNOSIS — Z1231 Encounter for screening mammogram for malignant neoplasm of breast: Secondary | ICD-10-CM

## 2016-12-23 ENCOUNTER — Ambulatory Visit (INDEPENDENT_AMBULATORY_CARE_PROVIDER_SITE_OTHER): Payer: BC Managed Care – PPO | Admitting: Family Medicine

## 2016-12-23 ENCOUNTER — Encounter: Payer: Self-pay | Admitting: Family Medicine

## 2016-12-23 ENCOUNTER — Ambulatory Visit: Payer: BC Managed Care – PPO | Admitting: Family Medicine

## 2016-12-23 DIAGNOSIS — J069 Acute upper respiratory infection, unspecified: Secondary | ICD-10-CM | POA: Insufficient documentation

## 2016-12-23 MED ORDER — AMOXICILLIN-POT CLAVULANATE 875-125 MG PO TABS: 1.0000 | ORAL_TABLET | Freq: Two times a day (BID) | ORAL | 0 refills | Status: DC

## 2016-12-23 NOTE — Patient Instructions (Signed)
Mucinex, plenty of fluids, warm compresses.  If worse in the meantime, after a few days, then start the antibiotics.  Take care.  Glad to see you.

## 2016-12-23 NOTE — Assessment & Plan Note (Signed)
Nontoxic, ctab.  This could be viral, but she has baseline anatomic changes contributing to nasal congestion.  dw pt about options.  Reasonable to hold augmentin for now, see AVS.  Start in a few days if not better with supportive care.  She agrees.  Update us as needed.
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
## 2016-12-27 ENCOUNTER — Encounter: Payer: Self-pay | Admitting: Podiatry

## 2016-12-27 ENCOUNTER — Ambulatory Visit (INDEPENDENT_AMBULATORY_CARE_PROVIDER_SITE_OTHER): Payer: BC Managed Care – PPO | Admitting: Podiatry

## 2016-12-27 DIAGNOSIS — M7661 Achilles tendinitis, right leg: Secondary | ICD-10-CM

## 2016-12-27 DIAGNOSIS — M7662 Achilles tendinitis, left leg: Secondary | ICD-10-CM

## 2016-12-27 DIAGNOSIS — M9261 Juvenile osteochondrosis of tarsus, right ankle: Secondary | ICD-10-CM | POA: Insufficient documentation

## 2016-12-27 DIAGNOSIS — Z9889 Other specified postprocedural states: Secondary | ICD-10-CM

## 2016-12-27 HISTORY — DX: Juvenile osteochondrosis of tarsus, right ankle: M92.61

## 2016-12-27 NOTE — Progress Notes (Signed)
Subjective: [name] is a 51 y.o. is seen today in office s/p right retrocalcaneal exostectomy, achilles tenolysis, gastroc recession preformed on 11/13/2016. Cefazolin she has been walking in the boot at times. She gets some pain to the bottom to the heel as well as the back of the heel. She is not had a take any pain medicine however. She has noticed the bottom of the incision has opened slightly and she's been improving and buttock ointment on the area. Denies any drainage or pus and denies any redness or red streaks. She presents today wearing a cam boot and a knee scooter. She has no additional concerns or questions today. She presents today with her daughter.  Denies any systemic complaints such as fevers, chills, nausea, vomiting. No calf pain, chest pain, shortness of breath.   Objective: General: No acute distress, AAOx3  DP/PT pulses palpable 2/4, CRT < 3 sec to all digits.  Protective sensation intact. Motor function intact.  Right foot: Incision is well coapted without any evidence of dehiscence and staples are intact to the Achilles incision. The gastrocnemius recession site is well-healed the scar is formed. On the very inferior portion of the Achilles incision is very small superficial opening measuring approximately 0.3 x 0.2 cm in a superficial granular wound. There is no drainage or pus expressed. This no probing, undermining or tunneling. No other open lesions are identified bilaterally. There is mild discomfort the posterior as well as inferior aspect of the calcaneus. There is no pain without compression of the calcaneus. I'll [place] surgical site but overall this is improving. There is no erythema or increase in warmth. Thompson test is negative and Achilles tendon appears to be intact. No other open lesions or pre-ulcerative lesions.  No pain with calf compression, swelling, warmth, erythema.   Assessment and Plan:  Status post right foot surgery, with small superficial  wound dehiscence along the inferior portion of the incision without any signs of infection  -Treatment options discussed including all alternatives, risks, and complications -The staples along the superior portion of the incision are removed without complication after removal incision remained well coapted. I left the inferior couple staples intact to help make sure the incision is healing without any further incident. Recommended a small amount of Betadine to the area on the wound daily. She's been putting antibiotic ointment along the area and is somewhat macerated. Once we signs or symptoms of infection. Due to the slight opening recommended to return to nonweightbearing. Also padding of the area to make she does not rubbing said the boot. -I went ahead and gave her prescription for physical therapy. I want to wait to the wound heals completely before starting therapy. Hopefully the week we can start this. -Continue ice and elevation -Once the wound heals she can start to drive short distances but must take the boot off the drive. She also not be taking any pain medication she is driving and she understands this. -Follow up in 2 weeks or sooner if needed. Call any questions or concerns or any changes symptoms and she agrees to this. Should no further questions or concerns today.  Ovid CurdMatthew Wagoner, DPM

## 2017-01-07 ENCOUNTER — Ambulatory Visit: Payer: BC Managed Care – PPO | Attending: Podiatry | Admitting: Physical Therapy

## 2017-01-07 ENCOUNTER — Encounter: Payer: Self-pay | Admitting: Physical Therapy

## 2017-01-07 ENCOUNTER — Telehealth: Payer: Self-pay | Admitting: *Deleted

## 2017-01-07 DIAGNOSIS — M25571 Pain in right ankle and joints of right foot: Secondary | ICD-10-CM | POA: Insufficient documentation

## 2017-01-07 DIAGNOSIS — M25671 Stiffness of right ankle, not elsewhere classified: Secondary | ICD-10-CM | POA: Insufficient documentation

## 2017-01-07 DIAGNOSIS — R262 Difficulty in walking, not elsewhere classified: Secondary | ICD-10-CM

## 2017-01-07 DIAGNOSIS — M6281 Muscle weakness (generalized): Secondary | ICD-10-CM | POA: Diagnosis present

## 2017-01-07 NOTE — Telephone Encounter (Addendum)
Representative - [facility] PT called for orders for pt, she is in office and did not bring orders. I read the plan to [facility] representative stating not to begin PT until the incision site was healed, and to have pt make an appt on Nurses schedule to have the site evaluated for healing. The [facility] PT staffer states she will go right now and inform the physical Therapist. I faxed copy of LOV 12/27/2016 and referral sheet to [facility] PT.01/28/2017-Pt states she is at the Physical Therapist and wanted to know how much weight bearing to do. I read pt the 01/10/2017 and it stated she could begin weightbearing 2-3 weeks after that appt. Pt states understanding.

## 2017-01-08 NOTE — Therapy (Signed)
[facility]ICINE [address] [place], [address] Phone: [phone]   Fax:  [phone]  Physical Therapy Evaluation  Patient Details  Name: [name] MRN: [id number] Date of Birth: 1966/05/08 Referring Provider: Ovid Curd DPM  Encounter Date: 01/07/2017      PT End of Session - 01/07/17 1715    Visit Number 1   Number of Visits 16   Date for PT Re-Evaluation 03/04/17   PT Start Time 1610   PT Stop Time 1705   PT Time Calculation (min) 55 min   Activity Tolerance Patient tolerated treatment well   Behavior During Therapy [facility] for tasks assessed/performed      Past Medical History:  Diagnosis Date  . Anemia   . Depression   . H/O scarlet fever   . Heart murmur   . History of chicken pox   . History of genital warts   . Hypertension   . Plantar fasciitis   . Rash due to allergy    Wheat allergy.  . Shingles     Past Surgical History:  Procedure Laterality Date  . ABDOMINAL HYSTERECTOMY  2005  . KNEE SURGERY Left    2015  . VAGINAL DELIVERY     x2    There were no vitals filed for this visit.       Subjective Assessment - 01/07/17 1626    Subjective Patient reports she has stiffness in right ankle that is healing from surgery. She has been putting neosporin on open area of incision and has it bandaged. She is beginning to put more weight on her right foot with cam boot on. She is to see MD this week for follow up.    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 Yes   Pain Score 4    Pain Location Ankle   Pain Orientation Right   Pain Descriptors / Indicators Aching;Tightness   Pain Type Acute pain;Surgical pain   Pain Onset 1 to 4 weeks ago   Pain  Frequency Intermittent            [facility] PT Assessment - 01/07/17 1625      Assessment   Medical Diagnosis right ankle surgery;heel spur removal, Achilles tendon repair   Referring Provider Ovid Curd DPM   Onset Date/Surgical Date 11/13/16   Hand Dominance Right   Next MD Visit unknown   Prior Therapy none      Precautions   Precautions Other (comment)   Precaution Comments Achilles tendon repair; non weight bearing until incision is completely healed     Restrictions   Weight Bearing Restrictions Yes   RLE Weight Bearing Non weight bearing  per MD orders until incision completely heals     Balance Screen   Has the patient fallen in the past 6 months No     Home Environment   Living Environment Private residence   Living Arrangements Spouse/significant other;Children   Home Access Ramped entrance   Entrance Stairs-Number of Steps --   Entrance Stairs-Rails Left;None   Home Layout One level  going up   Home Equipment Walker - 2 wheels;Crutches  tall height toilet     Prior Function   Level of Independence Independent   Vocation Full time  employment   Buyer, retail: standing, walking   Leisure cooking     Cognition   Overall Cognitive Status Within Functional Limits for tasks assessed     Observation/Other Assessments   Observations patient arrived with right LE on scooter, NWB right LE with cam boot in place   Skin Integrity well healed incision posterior lower leg where staples were removed, bandage clean and dry posterior right heel : patient reports incision still open and no drainage, signs of infection noted   Lower Extremity Functional Scale  26/80 (80 = no self perceived disability)     ROM / Strength   AROM / PROM / Strength AROM;Strength     AROM   Overall AROM  Within functional limits for tasks performed   Overall AROM Comments deferred for right ankle due to surgery and precautions     Strength   Overall Strength Due to  precautions   Overall Strength Comments left LE WFL throughout; right LE hip, knee WFL; ankle not tested due to recent surgery/.precautions for Achilles tendon repair       Objective measurements completed on examination: See above findings.         PT Education - 01/07/17 1700    Education provided Yes   Education Details POC: precautions for surgery; not to begin exercises for ankle in therapy until incision is healed per MD note; HEP given for core, hip exercises core strengthening, hip isometric strengthening: sitting scapular retraction bilateral and unilateral with resistive bands, palloff press to front, hip adduction with ball and glute sets, hip abduction isometric holds     Person(s) Educated Patient   Methods Explanation;Demonstration;Verbal cues;Handout   Comprehension Verbalized understanding;Returned demonstration;Verbal cues required             PT Long Term Goals - 01/07/17 1733      PT LONG TERM GOAL #1   Title Patient will demonsrate improved function with gait, daily activties with LEFS score of 40/80   Baseline LEFS 26/80   Status New   Target Date 02/06/17     PT LONG TERM GOAL #2   Title Patient will demonsrate improved function with gait, daily activties with LEFS score of 60/80   Baseline LEFS 26/80   Status New   Target Date 03/04/17     PT LONG TERM GOAL #3   Title Patient will be independent with home program for self management of pain and exercise for ROM and strengthening  in order to continue to progress towards full function once discharged from physical therapy   Baseline limited knowledge of appropriate exercise and progression    Status New   Target Date 03/04/17     PT LONG TERM GOAL #4   Title improved gait pattern with least restrictive AD in order to improve functional ambulation in community   Baseline using scooter, limited WB restriction s/p surgery   Status New   Target Date 03/04/17                Plan -  01/07/17 1724    Clinical Impression Statement Patient is a 51 year old female who presents today s/p right ankle surgery with Achilles tendon repair with healing incision. Evaluation was limited due to healing wound posterior heel, per MD notes. She is to return to MD for follow up this week and return to therapy once wound is completely healed. She is limited in function due to recent surgery and precautions and will benefit from physical  therapy intervention to achieve full ROM/strength.    History and Personal Factors relevant to plan of care: s/p right ankle surgery with Achilles tendon repair 11/13/2016   Clinical Presentation Evolving   Clinical Presentation due to: healing surgery with limited function   Clinical Decision Making Moderate   Rehab Potential Good   Clinical Impairments Affecting Rehab Potential (+) motivated, active lifestyle, acute condition    PT Frequency 2x / week   PT Duration 8 weeks   PT Treatment/Interventions Manual techniques;Neuromuscular re-education;Patient/family education;Cryotherapy;Therapeutic exercise;Moist Heat;Electrical Stimulation;Passive range of motion   PT Next Visit Plan manual STM, exercise per protocol for Achilles tendon repair and MD orders   PT Home Exercise Plan core strengthening, hip isometric strengthening: sitting scapular retraction bilateral and unilateral with resistive bands, palloff press to front, hip adduction with ball and glute sets, hip abduction isometric holds   Consulted and Agree with Plan of Care Patient      Patient will benefit from skilled therapeutic intervention in order to improve the following deficits and impairments:  Decreased strength, Pain, Decreased range of motion, Impaired flexibility, Impaired perceived functional ability, Decreased activity tolerance, Decreased endurance, Difficulty walking  Visit Diagnosis: Muscle weakness (generalized) - Plan: PT plan of care cert/re-cert  Pain in right ankle and joints  of right foot - Plan: PT plan of care cert/re-cert  Difficulty in walking, not elsewhere classified - Plan: PT plan of care cert/re-cert  Stiffness of right ankle, not elsewhere classified - Plan: PT plan of care cert/re-cert     Problem List Patient Active Problem List   Diagnosis Date Noted  . Haglund's deformity of right heel 12/27/2016  . URI (upper respiratory infection) 12/23/2016  . Hyperlipidemia 01/09/2016  . Prediabetes 01/09/2016  . Preventative health care 01/09/2016  . Urinary incontinence 01/09/2016  . Achilles tendonitis, bilateral 10/24/2015  . Heel spur 10/24/2015  . Dermatitis of left eyelid 06/30/2015  . Closed fracture of part of upper end of humerus 06/18/2015  . Iron deficiency anemia 12/29/2013  . Essential hypertension 12/29/2013  . Plantar fasciitis 12/29/2013  . Obesity (BMI 30.0-34.9) 12/29/2013    Beacher May PT 01/08/2017, 7:47 PM  [facility] PHYSICAL AND SPORTS MEDICINE [address] [place], [address] Phone: [phone]   Fax:  [phone]  Name: [name] MRN: [id number] Date of Birth: 24-Mar-1966

## 2017-01-10 ENCOUNTER — Encounter: Payer: Self-pay | Admitting: Podiatry

## 2017-01-10 ENCOUNTER — Ambulatory Visit (INDEPENDENT_AMBULATORY_CARE_PROVIDER_SITE_OTHER): Payer: Self-pay | Admitting: Podiatry

## 2017-01-10 DIAGNOSIS — M7662 Achilles tendinitis, left leg: Secondary | ICD-10-CM

## 2017-01-10 DIAGNOSIS — M9261 Juvenile osteochondrosis of tarsus, right ankle: Secondary | ICD-10-CM

## 2017-01-10 DIAGNOSIS — M7661 Achilles tendinitis, right leg: Secondary | ICD-10-CM

## 2017-01-10 DIAGNOSIS — Z9889 Other specified postprocedural states: Secondary | ICD-10-CM

## 2017-01-13 NOTE — Progress Notes (Signed)
Subjective: [name] is a 51 y.o. is seen today in office s/p right retrocalcaneal exostectomy, achilles tenolysis, gastroc recession preformed on 11/13/2016. She's been quite physical therapy to work on core exercises and she has not done much weightbearing exercises with physical therapy yet. She does that she has slowly been transition to walking the cam boot. She had some pain in the bottom of her heel and she does is that overall she is doing well. She is only done minimal ambulatory in the cam boot. She states the areas in the back of the heel is doing better after her physical therapist gave her some silver cream to put on the area. She denies any drainage or pus and she denies any surrounding redness or red streaks. Denies any systemic complaints such as fevers, chills, nausea, vomiting. No calf pain, chest pain, shortness of breath.   Objective: General: No acute distress, AAOx3  DP/PT pulses palpable 2/4, CRT < 3 sec to all digits.  Protective sensation intact. Motor function intact.  Right foot: Incision is well coapted without any evidence of dehiscence and staples are intact to the Achilles incision distally. On the very inferior portion is a small superficial opening, dehiscence. This is almost completely closed at this point is new pain tissue overlying the area. There is no probing, undermining or tunneling this is very superficial. There is no drainage or pus expressed. There is no clinical signs of infection present. The gastrocnemius recession site is well-healed. No other open lesions or pre-ulcerative lesions are identified today. There is no pain with calf compression, swelling, warmth, erythema.     Assessment and Plan:  Status post right foot surgery, with small superficial wound dehiscence along the inferior portion of the incision without any signs of infection which is healing  -Treatment options discussed including all alternatives, risks, and complications -I  removed the remainder of the staples today without any complications. After removal incision remained well coapted except for the very inferior portion which is almost healed at this point. Recommend continue silver gel daily. -She can sleep transition to weightbearing as tolerated in a CAM boot but do this gradually with partial weightbearing to full weightbearing. She can continue physical therapy working on core exercises. If the next 2-3 weeks. Start lower extremity weightbearing exercises. On weightbearing wound completely heals before doing this however. -Monitor for any clinical signs or symptoms of infection and directed to call the office immediately should any occur or go to the ER. -Follow up in 10 days or sooner if needed. Call any questions or concerns or any changes symptoms and she agrees to this. Should no further questions or concerns today.  Ovid Curd, DPM

## 2017-01-15 ENCOUNTER — Ambulatory Visit: Payer: BC Managed Care – PPO | Admitting: Physical Therapy

## 2017-01-21 ENCOUNTER — Encounter: Payer: BC Managed Care – PPO | Admitting: Physical Therapy

## 2017-01-23 ENCOUNTER — Encounter: Payer: BC Managed Care – PPO | Admitting: Physical Therapy

## 2017-01-28 ENCOUNTER — Encounter: Payer: Self-pay | Admitting: Physical Therapy

## 2017-01-28 ENCOUNTER — Telehealth: Payer: Self-pay | Admitting: Podiatry

## 2017-01-28 ENCOUNTER — Ambulatory Visit: Payer: BC Managed Care – PPO | Attending: Podiatry | Admitting: Physical Therapy

## 2017-01-28 DIAGNOSIS — M25671 Stiffness of right ankle, not elsewhere classified: Secondary | ICD-10-CM | POA: Diagnosis present

## 2017-01-28 DIAGNOSIS — M25571 Pain in right ankle and joints of right foot: Secondary | ICD-10-CM | POA: Insufficient documentation

## 2017-01-28 DIAGNOSIS — M6281 Muscle weakness (generalized): Secondary | ICD-10-CM | POA: Insufficient documentation

## 2017-01-28 DIAGNOSIS — R262 Difficulty in walking, not elsewhere classified: Secondary | ICD-10-CM | POA: Diagnosis present

## 2017-01-28 NOTE — Telephone Encounter (Addendum)
Left message informing pt I would call again concerning her PT questions. I read pt LOV Plan of 01/10/2017.

## 2017-01-28 NOTE — Telephone Encounter (Signed)
Dr. Ardelle AntonWagoner is having me do physical therapy and I have some questions.

## 2017-01-29 NOTE — Therapy (Signed)
[facility]IC[facility] [address] [place], [place], [address] Phone: [phone]   Fax:  [phone]  Physical Therapy Treatment  Patient Details  Name: Erica LassRebecca A Ancheta MRN: [id number] Date of Birth: 1966/03/03 Referring Provider: Ovid CurdWagoner, Matthew DPM  Encounter Date: 01/28/2017      PT End of Session - 01/28/17 1624    Visit Number 2   Number of Visits 16   Date for PT Re-Evaluation 03/04/17   PT Start Time 1618   PT Stop Time 1658   PT Time Calculation (min) 40 min   Activity Tolerance Patient tolerated treatment well   Behavior During [facility] for tasks assessed/performed      Past Medical History:  Diagnosis Date  . Anemia   . Depression   . H/O scarlet fever   . Heart murmur   . History of chicken pox   . History of genital warts   . Hypertension   . Plantar fasciitis   . Rash due to allergy    Wheat allergy.  . Shingles     Past Surgical History:  Procedure Laterality Date  . ABDOMINAL HYSTERECTOMY  2005  . KNEE SURGERY Left    2015  . VAGINAL DELIVERY     x2    There were no vitals filed for this visit.      Subjective Assessment - 01/28/17 1621    Subjective Patient reports she is transitioning out of boot and she is wearing shoe with orthotics at home and she is to transition to full weight bearing as able per MD order.    Limitations Standing;Walking;House hold activities;Other (comment)  work related activities as Runner, broadcasting/film/videoteacher   Patient Stated Goals To improve strength, ability to walk and stand without difficulty so she can have surgery on left ankle in the near future   Currently in Pain? No/denies       Objective:  Gait: ambulating independently with sneakers and orthotics Observation: right ankle with healed incision posterior aspect and along calf Palpation; decreased soft tissue elasticity along incision right ankle and calf AROM: right ankle DF 0-7; inversion/eversion  WFL  Treatment: Manual therapy: 15 min. Goal: improve soft tissue elasticity, improve ROM, gait STM performed to right ankle and calf with superficial and compression techniques with focus on scar tissue mobilization; patient in long sitting  Therapeutic exercise: patient performed exercises with demonstration, instruction, assistance, VC of therapist: goal: independent HEP, improve foot ankle disability index Toe flexion with ankle DF x 5 reps Toe extension with ankle PF x 5 reps  NuStep x 5 min. Level #4   Patient response to treatment: improved soft tissue elasticity along incision on calf and along posterior ankle by >50% following manual therapy. Good demonstration of exercises following instruction and with minimal VC.           PT Education - 01/28/17 1700    Education provided Yes   Education Details HEP: foot intrinsics with toe flexion/extension with ankle DF/PF; active ankle ROM DF and passive PF   Person(s) Educated Patient   Methods Explanation;Demonstration;Verbal cues   Comprehension Verbalized understanding;Returned demonstration;Verbal cues required             PT Long Term Goals - 01/07/17 1733      PT LONG TERM GOAL #1   Title Patient will demonsrate improved function with gait, daily activties with LEFS score of 40/80   Baseline LEFS 26/80   Status New   Target Date  02/06/17     PT LONG TERM GOAL #2   Title Patient will demonsrate improved function with gait, daily activties with LEFS score of 60/80   Baseline LEFS 26/80   Status New   Target Date 03/04/17     PT LONG TERM GOAL #3   Title Patient will be independent with home program for self management of pain and exercise for ROM and strengthening  in order to continue to progress towards full function once discharged from physical therapy   Baseline limited knowledge of appropriate exercise and progression    Status New   Target Date 03/04/17     PT LONG TERM GOAL #4   Title improved gait  pattern with least restrictive AD in order to improve functional ambulation in community   Baseline using scooter, limited WB restriction s/p surgery   Status New   Target Date 03/04/17               Plan - 01/28/17 1700    Clinical Impression Statement Patient is returning following 3 week absence as she healed insicion s/p Achilles tendon repair. She presents with improving ROM and strength in right ankle and is able to weight bear with less difficulty and no pain. She should continue to progress with additional physical therapy intervention to improve soft tissue elasticity and progressive exercise.    Rehab Potential Good   Clinical Impairments Affecting Rehab Potential (+) motivated, active lifestyle, acute condition    PT Frequency 2x / week   PT Duration 8 weeks   PT Treatment/Interventions Manual techniques;Neuromuscular re-education;Patient/family education;Cryotherapy;Therapeutic exercise;Moist Heat;Electrical Stimulation;Passive range of motion   PT Next Visit Plan manual STM, exercise per protocol for Achilles tendon repair and MD orders   PT Home Exercise Plan core strengthening, hip isometric strengthening: sitting scapular retraction bilateral and unilateral with resistive bands, palloff press to front, hip adduction with ball and glute sets, hip abduction isometric holds      Patient will benefit from skilled therapeutic intervention in order to improve the following deficits and impairments:  Decreased strength, Pain, Decreased range of motion, Impaired flexibility, Impaired perceived functional ability, Decreased activity tolerance, Decreased endurance, Difficulty walking  Visit Diagnosis: Pain in right ankle and joints of right foot  Muscle weakness (generalized)  Difficulty in walking, not elsewhere classified  Stiffness of right ankle, not elsewhere classified     Problem List Patient Active Problem List   Diagnosis Date Noted  . Haglund's deformity of  right heel 12/27/2016  . URI (upper respiratory infection) 12/23/2016  . Hyperlipidemia 01/09/2016  . Prediabetes 01/09/2016  . Preventative health care 01/09/2016  . Urinary incontinence 01/09/2016  . Achilles tendonitis, bilateral 10/24/2015  . Heel spur 10/24/2015  . Dermatitis of left eyelid 06/30/2015  . Closed fracture of part of upper end of humerus 06/18/2015  . Iron deficiency anemia 12/29/2013  . Essential hypertension 12/29/2013  . Plantar fasciitis 12/29/2013  . Obesity (BMI 30.0-34.9) 12/29/2013    Beacher May PT 01/29/2017, 10:49 PM  [facility] PHYSICAL AND SPORTS MEDICINE [address] [place], [address] Phone: [phone]   Fax:  [phone]  Name: SIMISOLA SANDLES MRN: [id number] Date of Birth: [birth year]

## 2017-01-30 ENCOUNTER — Encounter: Payer: Self-pay | Admitting: Podiatry

## 2017-01-30 ENCOUNTER — Encounter: Payer: BC Managed Care – PPO | Admitting: Physical Therapy

## 2017-01-30 ENCOUNTER — Ambulatory Visit (INDEPENDENT_AMBULATORY_CARE_PROVIDER_SITE_OTHER): Payer: Self-pay | Admitting: Podiatry

## 2017-01-30 DIAGNOSIS — Z9889 Other specified postprocedural states: Secondary | ICD-10-CM

## 2017-01-30 DIAGNOSIS — M7661 Achilles tendinitis, right leg: Secondary | ICD-10-CM

## 2017-01-30 DIAGNOSIS — M9261 Juvenile osteochondrosis of tarsus, right ankle: Secondary | ICD-10-CM

## 2017-01-30 DIAGNOSIS — M7662 Achilles tendinitis, left leg: Secondary | ICD-10-CM

## 2017-01-31 MED ORDER — IBUPROFEN 600 MG PO TABS: 600.0000 mg | ORAL_TABLET | Freq: Three times a day (TID) | ORAL | 2 refills | Status: DC | PRN

## 2017-01-31 NOTE — Progress Notes (Signed)
Subjective: [name] is a 51 y.o. is seen today in office s/p right retrocalcaneal exostectomy, achilles tenolysis, gastroc recession preformed on 11/13/2016. She said that she is doing well. She is continued physical therapy and they been doing more lower chemise exercises. She does continue to use the knee scooter she's doing a lot of long distance walking. She also uses at work just to help protect the area. However she does try to walk without the use of any assistive devices at home or short distances. She has minimal pain on surgical site and she does get some pain to the bottom of the heel. She denies any drainage or pus and she denies any surrounding redness or red streaks. Denies any systemic complaints such as fevers, chills, nausea, vomiting. No calf pain, chest pain, shortness of breath.   Objective: General: No acute distress, AAOx3  DP/PT pulses palpable 2/4, CRT < 3 sec to all digits.  Protective sensation intact. Motor function intact.  Right foot: Incision is well coapted without any evidence of dehiscence and the incision is well-healed with a scar. There is minimal tenderness to palpation of the surgical site there is trace edema. She has some mild discomfort on the plantar medial aspect of the calcaneus at the insertion the plantar fascia on the medial band. There is no pain lateral compression of the calcaneus. There is no swelling to the bottom of the heel. Achilles tendon appears to be intact and Thompson test is negative. No other open lesions or pre-ulcerative lesions are identified today. There is no pain with calf compression, swelling, warmth, erythema.       Assessment and Plan:  Status post right foot surgery improving,   -Treatment options discussed including all alternatives, risks, and complications -At this point she is doing well. Continue physical therapy and gradual transition to a regular shoe full time as tolerated. The fluid can start pool exercises  the first week of October. She can wear regular shoe as tolerated and she states that her right and she feels better in the cam boot anyway. She has asthma pedicures and I would like for her to hold off on this. -Continue compression sock to help with swelling. -Ice and elevation -Ibuprofen 600 mg ordered. She states she has been taking her husbands rx to help with the "ache" towards the end of the day.  -RTC 4 weeks or sooner if needed.  Ovid Curd, DPM

## 2017-02-04 ENCOUNTER — Encounter: Payer: Self-pay | Admitting: Physical Therapy

## 2017-02-04 ENCOUNTER — Ambulatory Visit: Payer: BC Managed Care – PPO | Admitting: Physical Therapy

## 2017-02-04 DIAGNOSIS — M25571 Pain in right ankle and joints of right foot: Secondary | ICD-10-CM

## 2017-02-04 DIAGNOSIS — M6281 Muscle weakness (generalized): Secondary | ICD-10-CM

## 2017-02-04 DIAGNOSIS — R262 Difficulty in walking, not elsewhere classified: Secondary | ICD-10-CM

## 2017-02-04 DIAGNOSIS — M25671 Stiffness of right ankle, not elsewhere classified: Secondary | ICD-10-CM

## 2017-02-04 NOTE — Therapy (Signed)
[place] [facility]ICINE [address] [place], [address] Phone: [phone]   Fax:  [phone]  Physical Therapy Treatment  Patient Details  Name: [name] MRN: [id number] Date of Birth: Jul 14, 1965 Referring Provider: Ovid Curd DPM  Encounter Date: 02/04/2017      PT End of Session - 02/04/17 1836    Visit Number 3   Number of Visits 16   Date for PT Re-Evaluation 03/04/17   PT Start Time 1622   PT Stop Time 1705   PT Time Calculation (min) 43 min   Activity Tolerance Patient tolerated treatment well   Behavior During Therapy [facility] for tasks assessed/performed      Past Medical History:  Diagnosis Date  . Anemia   . Depression   . H/O scarlet fever   . Heart murmur   . History of chicken pox   . History of genital warts   . Hypertension   . Plantar fasciitis   . Rash due to allergy    Wheat allergy.  . Shingles     Past Surgical History:  Procedure Laterality Date  . ABDOMINAL HYSTERECTOMY  2005  . KNEE SURGERY Left    2015  . VAGINAL DELIVERY     x2    There were no vitals filed for this visit.      Subjective Assessment - 02/04/17 1624    Subjective Patient reports she is wearing silicone socks to help with irritation of heel in shoes and this seems to be helping.    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 Other (Comment)  soreness in plantar aspect of foot        Objective:  Gait: ambulating independently with sneakers and orthotics Observation: right ankle with healed incision posterior aspect and along calf Palpation; decreased soft tissue elasticity along incision right ankle and calf, decreased soft tissue elasticity along medial arch of  right foot  Treatment: Manual therapy: 15 min. Goal: improve soft tissue elasticity, improve ROM, gait STM performed to right ankle and calf and plantar aspect of foot with superficial and compression techniques with focus on scar tissue mobilization; patient prone lying  Therapeutic exercise: patient performed exercises with demonstration, instruction, assistance, VC of therapist: goal: independent HEP, improve foot ankle disability index Leg press 25# through short arc x 10; 45# x 10 reps through short arc with VC  NuStep x 8 min. Level #4; seat #10  Patient response to treatment: Improved soft tissue elasticity along calf, foot and scars with decreased spasms and able to stand and walk with less stiffness. Required assistance and guidance to perform exercises with correct technique, alignment.                 PT Education - 02/04/17 1835    Education provided Yes   Education Details exercise instruction, gentle AROM with mild stretch of calf, use of heat to assist with soft tissue elasticity lateral calf and foot    Person(s) Educated Patient   Methods Explanation;Demonstration;Verbal cues   Comprehension Verbalized understanding;Returned demonstration;Verbal cues required             PT Long Term Goals - 01/07/17 1733      PT LONG TERM GOAL #1   Title Patient  will demonsrate improved function with gait, daily activties with LEFS score of 40/80   Baseline LEFS 26/80   Status New   Target Date 02/06/17     PT LONG TERM GOAL #2   Title Patient will demonsrate improved function with gait, daily activties with LEFS score of 60/80   Baseline LEFS 26/80   Status New   Target Date 03/04/17     PT LONG TERM GOAL #3   Title Patient will be independent with home program for self management of pain and exercise for ROM and strengthening  in order to continue to progress towards full function once discharged from physical therapy   Baseline limited knowledge of  appropriate exercise and progression    Status New   Target Date 03/04/17     PT LONG TERM GOAL #4   Title improved gait pattern with least restrictive AD in order to improve functional ambulation in community   Baseline using scooter, limited WB restriction s/p surgery   Status New   Target Date 03/04/17               Plan - 02/04/17 1837    Clinical Impression Statement Patient demonstrated improved soft tissue elasticity with STM and able to ambulate with decresaed tightness in foot/calf following session. She is progressing steadily with ROM, decreased soreness and improving functional use of right ankle/LE following surgery.    Rehab Potential Good   Clinical Impairments Affecting Rehab Potential (+) motivated, active lifestyle, acute condition    PT Frequency 2x / week   PT Duration 8 weeks   PT Treatment/Interventions Manual techniques;Neuromuscular re-education;Patient/family education;Cryotherapy;Therapeutic exercise;Moist Heat;Electrical Stimulation;Passive range of motion   PT Next Visit Plan manual STM, exercise per protocol for Achilles tendon repair and MD orders   PT Home Exercise Plan core strengthening, hip isometric strengthening: sitting scapular retraction bilateral and unilateral with resistive bands, palloff press to front, hip adduction with ball and glute sets, hip abduction isometric holds      Patient will benefit from skilled therapeutic intervention in order to improve the following deficits and impairments:  Decreased strength, Pain, Decreased range of motion, Impaired flexibility, Impaired perceived functional ability, Decreased activity tolerance, Decreased endurance, Difficulty walking  Visit Diagnosis: Pain in right ankle and joints of right foot  Muscle weakness (generalized)  Difficulty in walking, not elsewhere classified  Stiffness of right ankle, not elsewhere classified     Problem List Patient Active Problem List   Diagnosis Date  Noted  . Haglund's deformity of right heel 12/27/2016  . URI (upper respiratory infection) 12/23/2016  . Hyperlipidemia 01/09/2016  . Prediabetes 01/09/2016  . Preventative health care 01/09/2016  . Urinary incontinence 01/09/2016  . Achilles tendonitis, bilateral 10/24/2015  . Heel spur 10/24/2015  . Dermatitis of left eyelid 06/30/2015  . Closed fracture of part of upper end of humerus 06/18/2015  . Iron deficiency anemia 12/29/2013  . Essential hypertension 12/29/2013  . Plantar fasciitis 12/29/2013  . Obesity (BMI 30.0-34.9) 12/29/2013    Beacher May PT 02/05/2017, 5:25 PM  [place] [facility] PHYSICAL AND SPORTS MEDICINE [address] [place], [address] Phone: [phone]   Fax:  [phone]  Name: [name] MRN: [id number] Date of Birth: [birth year]

## 2017-02-06 ENCOUNTER — Encounter: Payer: BC Managed Care – PPO | Admitting: Physical Therapy

## 2017-02-11 ENCOUNTER — Encounter: Payer: Self-pay | Admitting: Physical Therapy

## 2017-02-11 ENCOUNTER — Ambulatory Visit: Payer: BC Managed Care – PPO | Admitting: Physical Therapy

## 2017-02-11 DIAGNOSIS — M25671 Stiffness of right ankle, not elsewhere classified: Secondary | ICD-10-CM

## 2017-02-11 DIAGNOSIS — M6281 Muscle weakness (generalized): Secondary | ICD-10-CM

## 2017-02-11 DIAGNOSIS — M25571 Pain in right ankle and joints of right foot: Secondary | ICD-10-CM | POA: Diagnosis not present

## 2017-02-11 DIAGNOSIS — R262 Difficulty in walking, not elsewhere classified: Secondary | ICD-10-CM

## 2017-02-12 NOTE — Therapy (Signed)
[place] [facility]ORTS MEDICINE [address], [place], [address] Phone: [phone]   Fax:  [phone]  Physical Therapy Treatment  Patient Details  Name: [name] MRN: [id number] Date of Birth: April 12, 1966 Referring Provider: Ovid Curd DPM  Encounter Date: 02/11/2017      PT End of Session - 02/11/17 1625    Visit Number 4   Number of Visits 16   Date for PT Re-Evaluation 03/04/17   PT Start Time 1622   PT Stop Time 1707   PT Time Calculation (min) 45 min   Activity Tolerance Patient tolerated treatment well   Behavior During Therapy [facility] for tasks assessed/performed      Past Medical History:  Diagnosis Date  . Anemia   . Depression   . H/O scarlet fever   . Heart murmur   . History of chicken pox   . History of genital warts   . Hypertension   . Plantar fasciitis   . Rash due to allergy    Wheat allergy.  . Shingles     Past Surgical History:  Procedure Laterality Date  . ABDOMINAL HYSTERECTOMY  2005  . KNEE SURGERY Left    2015  . VAGINAL DELIVERY     x2    There were no vitals filed for this visit.      Subjective Assessment - 02/11/17 1622    Subjective Patient reports she is not wearing boot much now and is wearing it in situations she has to be more careful.    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 No/denies        Objective:  Gait: ambulating independently with sneakers and orthotics, mild limp noted Observation: right ankle with healed incision posterior aspect and along calf, mild limitation in soft tissue elasticity  Treatment: Therapeutic exercise: patient performed exercises with demonstration, instruction, assistance, VC of therapist:  goal: independent HEP, improve foot ankle disability index Leg press 25# through short arc x 10; 45# x 10 reps through short arc with VC Balance system weight shifting side to side with visual and VC 2 min. / forward and back x 2 min. : patient having difficulty with transferring weight equally through both LE's with improvement noted with repetition Limits of stability low and medium skill levels 2 sets with platform locked (accuracy up to 80% with repetition) Standing balance ball toss; un weighted ball: 10 reps with standing on airex balance pad then single leg with opposite on pad 10 reps each  NuStep x 10 min. Level #4; seat #10 (unbilled time)  Patient response to treatment: Improved motor control and balance with exercises on balance system with repetition. Fatigue noted at end of session. Required cuing to complete all exercises with good technique, alignment.           PT Education - 02/11/17 1624    Education provided Yes   Education Details exercise instruction, AROM continue with mild stretch calf   Person(s) Educated Patient   Methods Explanation;Demonstration   Comprehension Verbalized understanding;Returned demonstration;Verbal cues required             PT Long Term Goals - 01/07/17 1733      PT LONG TERM GOAL #1   Title Patient will demonsrate improved  function with gait, daily activties with LEFS score of 40/80   Baseline LEFS 26/80   Status New   Target Date 02/06/17     PT LONG TERM GOAL #2   Title Patient will demonsrate improved function with gait, daily activties with LEFS score of 60/80   Baseline LEFS 26/80   Status New   Target Date 03/04/17     PT LONG TERM GOAL #3   Title Patient will be independent with home program for self management of pain and exercise for ROM and strengthening  in order to continue to progress towards full function once discharged from physical therapy   Baseline limited knowledge of appropriate exercise and progression     Status New   Target Date 03/04/17     PT LONG TERM GOAL #4   Title improved gait pattern with least restrictive AD in order to improve functional ambulation in community   Baseline using scooter, limited WB restriction s/p surgery   Status New   Target Date 03/04/17               Plan - 02/11/17 1625    Clinical Impression Statement Patient demonstrates imporvement with ROM, strength and balance with cuing and guidance. She will require continued physical therapy intervention to achieve goals.    Rehab Potential Good   Clinical Impairments Affecting Rehab Potential (+) motivated, active lifestyle, acute condition    PT Frequency 2x / week   PT Duration 8 weeks   PT Treatment/Interventions Manual techniques;Neuromuscular re-education;Patient/family education;Cryotherapy;Therapeutic exercise;Moist Heat;Electrical Stimulation;Passive range of motion   PT Next Visit Plan manual STM, exercise per protocol for Achilles tendon repair and MD orders   PT Home Exercise Plan core strengthening, hip isometric strengthening: sitting scapular retraction bilateral and unilateral with resistive bands, palloff press to front, hip adduction with ball and glute sets, hip abduction isometric holds      Patient will benefit from skilled therapeutic intervention in order to improve the following deficits and impairments:  Decreased strength, Pain, Decreased range of motion, Impaired flexibility, Impaired perceived functional ability, Decreased activity tolerance, Decreased endurance, Difficulty walking  Visit Diagnosis: Pain in right ankle and joints of right foot  Muscle weakness (generalized)  Difficulty in walking, not elsewhere classified  Stiffness of right ankle, not elsewhere classified     Problem List Patient Active Problem List   Diagnosis Date Noted  . Haglund's deformity of right heel 12/27/2016  . URI (upper respiratory infection) 12/23/2016  . Hyperlipidemia 01/09/2016   . Prediabetes 01/09/2016  . Preventative health care 01/09/2016  . Urinary incontinence 01/09/2016  . Achilles tendonitis, bilateral 10/24/2015  . Heel spur 10/24/2015  . Dermatitis of left eyelid 06/30/2015  . Closed fracture of part of upper end of humerus 06/18/2015  . Iron deficiency anemia 12/29/2013  . Essential hypertension 12/29/2013  . Plantar fasciitis 12/29/2013  . Obesity (BMI 30.0-34.9) 12/29/2013    Beacher May PT 02/12/2017, 5:07 PM  [place] [facility] PHYSICAL AND SPORTS MEDICINE [address] [place], [address] Phone: [phone]   Fax:  [phone]  Name: [name] MRN: [id number] Date of Birth: 1965-11-27

## 2017-02-13 ENCOUNTER — Encounter: Payer: BC Managed Care – PPO | Admitting: Physical Therapy

## 2017-02-18 ENCOUNTER — Encounter: Payer: Self-pay | Admitting: Physical Therapy

## 2017-02-18 ENCOUNTER — Ambulatory Visit: Payer: BC Managed Care – PPO | Attending: Podiatry | Admitting: Physical Therapy

## 2017-02-18 DIAGNOSIS — M25571 Pain in right ankle and joints of right foot: Secondary | ICD-10-CM | POA: Insufficient documentation

## 2017-02-18 DIAGNOSIS — R262 Difficulty in walking, not elsewhere classified: Secondary | ICD-10-CM | POA: Diagnosis present

## 2017-02-18 DIAGNOSIS — M6281 Muscle weakness (generalized): Secondary | ICD-10-CM | POA: Insufficient documentation

## 2017-02-18 DIAGNOSIS — M25671 Stiffness of right ankle, not elsewhere classified: Secondary | ICD-10-CM | POA: Diagnosis present

## 2017-02-19 NOTE — Therapy (Signed)
[place] [facility] PHYSICAL [facility] SPORTS MEDICINE [address] [place], [address] Phone: [phone]   Fax:  [phone]  Physical Therapy Treatment  Patient Details  Name: [name] MRN: [id number] Date of Birth: Mar 12, 1966 Referring Provider: Ovid Curd DPM  Encounter Date: 02/18/2017      PT End of Session - 02/18/17 1623    Visit Number 5   Number of Visits 16   Date for PT Re-Evaluation 03/04/17   PT Start Time 1619   PT Stop Time 1700   PT Time Calculation (min) 41 min   Activity Tolerance Patient tolerated treatment well   Behavior During Therapy [facility] for tasks assessed/performed      Past Medical History:  Diagnosis Date  . Anemia   . Depression   . H/O scarlet fever   . Heart murmur   . History of chicken pox   . History of genital warts   . Hypertension   . Plantar fasciitis   . Rash due to allergy    Wheat allergy.  . Shingles     Past Surgical History:  Procedure Laterality Date  . ABDOMINAL HYSTERECTOMY  2005  . KNEE SURGERY Left    2015  . VAGINAL DELIVERY     x2    There were no vitals filed for this visit.      Subjective Assessment - 02/18/17 1620    Subjective Patient reports she is exercising more at home.   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 No/denies      Objective:  Gait: ambulating independently with sneakers and orthotics, mild limp noted Observation: right ankle with healed incision posterior aspect and along calf, decreased soft tissue elasticity  Treatment: Therapeutic exercise: patient performed exercises with demonstration, instruction, assistance, VC of therapist: goal: independent HEP, improve foot ankle disability index Leg press 25#  through short arc x 10; 45# x 10 reps through short arc with VC Balance system weight shifting side to side with visual and VC 2 min. / forward and back x 2 min. : patient having difficulty with transferring weight equally through both LE's with improvement noted with repetition Limits of stability low and medium and high skill levels 1 set each with platform unlocked (accuracy up to 90% with repetition) Random control with moderate difficulty platform unlocked #9 x 2 min. Side stepping along balance beam x 2 min. Standing balance ball toss; un weighted ball: 10 reps with standing on airex balance pad staggered stance with each LE with LE in back position; 25 tosses each  Manual therapy: 10 min: goal improve soft tissue elasticity left ankle, scar tissue STM performed along scar tissue, right ankle with patient prone lying, superficial techniques   Patient response to treatment: improved motor control and ability to balance and weight shift to right LE Improved motor control and balance with exercises on balance system with repetition. Fatigue noted at end of session. Required minimal cuing to complete all exercises with good technique, alignment.               PT Education - 02/18/17 1622    Education provided Yes   Education Details exercise instruction   Person(s) Educated Patient   Methods Explanation;Demonstration;Verbal cues   Comprehension Verbalized understanding;Returned demonstration;Verbal cues  required             PT Long Term Goals - 01/07/17 1733      PT LONG TERM GOAL #1   Title Patient will demonsrate improved function with gait, daily activties with LEFS score of 40/80   Baseline LEFS 26/80   Status New   Target Date 02/06/17     PT LONG TERM GOAL #2   Title Patient will demonsrate improved function with gait, daily activties with LEFS score of 60/80   Baseline LEFS 26/80   Status New   Target Date 03/04/17     PT LONG TERM GOAL #3   Title  Patient will be independent with home program for self management of pain and exercise for ROM and strengthening  in order to continue to progress towards full function once discharged from physical therapy   Baseline limited knowledge of appropriate exercise and progression    Status New   Target Date 03/04/17     PT LONG TERM GOAL #4   Title improved gait pattern with least restrictive AD in order to improve functional ambulation in community   Baseline using scooter, limited WB restriction s/p surgery   Status New   Target Date 03/04/17               Plan - 02/18/17 1700    Clinical Impression Statement Patient demonstrates steady improvement with strength, balance, function with daily activities. She requires guidance, STM for scar tissue mobility and continued instruciton for progressive exercise to imrpove funciton and allow transition to independent home program.    Rehab Potential Good   Clinical Impairments Affecting Rehab Potential (+) motivated, active lifestyle, acute condition    PT Frequency 2x / week   PT Duration 8 weeks   PT Treatment/Interventions Manual techniques;Neuromuscular re-education;Patient/family education;Cryotherapy;Therapeutic exercise;Moist Heat;Electrical Stimulation;Passive range of motion   PT Next Visit Plan manual STM, exercise per protocol for Achilles tendon repair and MD orders   PT Home Exercise Plan core strengthening, hip isometric strengthening: sitting scapular retraction bilateral and unilateral with resistive bands, palloff press to front, hip adduction with ball and glute sets, hip abduction isometric holds      Patient will benefit from skilled therapeutic intervention in order to improve the following deficits and impairments:  Decreased strength, Pain, Decreased range of motion, Impaired flexibility, Impaired perceived functional ability, Decreased activity tolerance, Decreased endurance, Difficulty walking  Visit Diagnosis: Muscle  weakness (generalized)  Pain in right ankle and joints of right foot  Difficulty in walking, not elsewhere classified  Stiffness of right ankle, not elsewhere classified     Problem List Patient Active Problem List   Diagnosis Date Noted  . Haglund's deformity of right heel 12/27/2016  . URI (upper respiratory infection) 12/23/2016  . Hyperlipidemia 01/09/2016  . Prediabetes 01/09/2016  . Preventative health care 01/09/2016  . Urinary incontinence 01/09/2016  . Achilles tendonitis, bilateral 10/24/2015  . Heel spur 10/24/2015  . Dermatitis of left eyelid 06/30/2015  . Closed fracture of part of upper end of humerus 06/18/2015  . Iron deficiency anemia 12/29/2013  . Essential hypertension 12/29/2013  . Plantar fasciitis 12/29/2013  . Obesity (BMI 30.0-34.9) 12/29/2013    Beacher May PT 02/19/2017, 11:13 PM  [place] [facility] PHYSICAL AND SPORTS MEDICINE [address] [place], [address] Phone: [phone]   Fax:  [phone]  Name: HENDRIX CONSOLE MRN: [id number] Date of Birth: Nov 04, 1965

## 2017-02-20 ENCOUNTER — Encounter: Payer: BC Managed Care – PPO | Admitting: Physical Therapy

## 2017-02-25 ENCOUNTER — Encounter: Payer: Self-pay | Admitting: Physical Therapy

## 2017-02-25 ENCOUNTER — Ambulatory Visit: Payer: BC Managed Care – PPO | Admitting: Physical Therapy

## 2017-02-25 DIAGNOSIS — R262 Difficulty in walking, not elsewhere classified: Secondary | ICD-10-CM

## 2017-02-25 DIAGNOSIS — M25571 Pain in right ankle and joints of right foot: Secondary | ICD-10-CM

## 2017-02-25 DIAGNOSIS — M6281 Muscle weakness (generalized): Secondary | ICD-10-CM

## 2017-02-25 DIAGNOSIS — M25671 Stiffness of right ankle, not elsewhere classified: Secondary | ICD-10-CM

## 2017-02-25 NOTE — Therapy (Signed)
[place] [facility] [facility]ICINE [address], [place], [address] Phone: [phone]   Fax:  [phone]  Physical Therapy Treatment  Patient Details  Name: [name] MRN: [id number] Date of Birth: 03-17-1966 Referring Provider: Ovid Curd DPM  Encounter Date: 02/25/2017      PT End of Session - 02/25/17 1717    Visit Number 6   Number of Visits 16   Date for PT Re-Evaluation 03/04/17   PT Start Time 1618   PT Stop Time 1655   PT Time Calculation (min) 37 min   Activity Tolerance Patient tolerated treatment well   Behavior During Therapy [facility] for tasks assessed/performed      Past Medical History:  Diagnosis Date  . Anemia   . Depression   . H/O scarlet fever   . Heart murmur   . History of chicken pox   . History of genital warts   . Hypertension   . Plantar fasciitis   . Rash due to allergy    Wheat allergy.  . Shingles     Past Surgical History:  Procedure Laterality Date  . ABDOMINAL HYSTERECTOMY  2005  . KNEE SURGERY Left    2015  . VAGINAL DELIVERY     x2    There were no vitals filed for this visit.      Subjective Assessment - 02/25/17 1622    Subjective Patient reports she did a lot with traveling and is sore and feeling inflamed today. She has not put ice on her ankle and has been taking ibuprofen.    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 Yes   Pain Score 5    Pain Location Ankle   Pain Orientation Right   Pain Descriptors / Indicators Aching;Burning   Pain Onset 1 to 4 weeks ago   Pain Frequency Intermittent      Objective: Observation/palpation: mild swelling and moderate warmth noted right ankle on arrival Gait: guarded  AROM right ankle:  slow cautious movement through partial ROM DF, PF, eversion/inversion  Treatment: Modalities: Electrical stimulation: 20 min.: High volt estim.clincial program for muscle spasms  (2) electrodes applied to medial and lateral aspect of right ankle intensity to tolerance with patient in reclined position with LE's  supported on wedge goal: pain, spasms Ice pack applied to right ankle during estim; no adverse reaction noted  Patient response to treatment: Patient demonstrated decreased swelling and pain by 50% following treatment.  Patient verbalized understanding of use of ice, rest and elevation to control pain, swelling tonight.          PT Education - 02/25/17 1716    Education provided Yes   Education Details instructed in use of ice and elevation to assist with increased warmth and pain in right ankle   Person(s) Educated Patient   Methods Explanation;Demonstration;Verbal cues   Comprehension Verbalized understanding             PT Long Term Goals - 01/07/17 1733      PT LONG TERM GOAL #1   Title Patient will demonsrate improved function with gait, daily activties with LEFS score of 40/80   Baseline LEFS 26/80   Status New   Target Date 02/06/17     PT LONG TERM  GOAL #2   Title Patient will demonsrate improved function with gait, daily activties with LEFS score of 60/80   Baseline LEFS 26/80   Status New   Target Date 03/04/17     PT LONG TERM GOAL #3   Title Patient will be independent with home program for self management of pain and exercise for ROM and strengthening  in order to continue to progress towards full function once discharged from physical therapy   Baseline limited knowledge of appropriate exercise and progression    Status New   Target Date 03/04/17     PT LONG TERM GOAL #4   Title improved gait pattern with least restrictive AD in order to improve functional ambulation in community   Baseline using scooter, limited WB restriction s/p surgery    Status New   Target Date 03/04/17               Plan - 02/25/17 1718    Clinical Impression Statement Patient with increased warmth and pain today therefore focused on pain control and decreasing swelling in right ankle. plan to have patient return tomorrow for balance exercises.    Rehab Potential Good   Clinical Impairments Affecting Rehab Potential (+) motivated, active lifestyle, acute condition    PT Frequency 2x / week   PT Duration 8 weeks   PT Treatment/Interventions Manual techniques;Neuromuscular re-education;Patient/family education;Cryotherapy;Therapeutic exercise;Moist Heat;Electrical Stimulation;Passive range of motion   PT Next Visit Plan manual STM, exercise per protocol for Achilles tendon repair and MD orders   PT Home Exercise Plan  add ice and elevation as needed; core strengthening, hip isometric strengthening: sitting scapular retraction bilateral and unilateral with resistive bands, palloff press to front, hip adduction with ball and glute sets, hip abduction isometric holds      Patient will benefit from skilled therapeutic intervention in order to improve the following deficits and impairments:  Decreased strength, Pain, Decreased range of motion, Impaired flexibility, Impaired perceived functional ability, Decreased activity tolerance, Decreased endurance, Difficulty walking  Visit Diagnosis: Muscle weakness (generalized)  Pain in right ankle and joints of right foot  Difficulty in walking, not elsewhere classified  Stiffness of right ankle, not elsewhere classified     Problem List Patient Active Problem List   Diagnosis Date Noted  . Haglund's deformity of right heel 12/27/2016  . URI (upper respiratory infection) 12/23/2016  . Hyperlipidemia 01/09/2016  . Prediabetes 01/09/2016  . Preventative health care 01/09/2016  . Urinary incontinence 01/09/2016  . Achilles tendonitis, bilateral 10/24/2015  . Heel spur 10/24/2015  . Dermatitis of left  eyelid 06/30/2015  . Closed fracture of part of upper end of humerus 06/18/2015  . Iron deficiency anemia 12/29/2013  . Essential hypertension 12/29/2013  . Plantar fasciitis 12/29/2013  . Obesity (BMI 30.0-34.9) 12/29/2013    Beacher May PT 02/25/2017, 5:20 PM  [place] [facility] PHYSICAL AND SPORTS MEDICINE [address], [place], [address] Phone: [phone]   Fax:  [phone]  Name: [name] MRN: [id number] Date of Birth: 1966/05/02

## 2017-02-26 ENCOUNTER — Ambulatory Visit: Payer: BC Managed Care – PPO | Admitting: Physical Therapy

## 2017-02-26 ENCOUNTER — Encounter: Payer: Self-pay | Admitting: Physical Therapy

## 2017-02-26 DIAGNOSIS — R262 Difficulty in walking, not elsewhere classified: Secondary | ICD-10-CM

## 2017-02-26 DIAGNOSIS — M6281 Muscle weakness (generalized): Secondary | ICD-10-CM

## 2017-02-26 DIAGNOSIS — M25671 Stiffness of right ankle, not elsewhere classified: Secondary | ICD-10-CM

## 2017-02-26 DIAGNOSIS — M25571 Pain in right ankle and joints of right foot: Secondary | ICD-10-CM

## 2017-02-26 NOTE — Therapy (Signed)
[facility] [address], [place], [address] Phone: [phone]   Fax:  [phone]  Physical Therapy Treatment  Patient Details  Name: [name] MRN: [id number] Date of Birth: [birth year] Referring Provider: Ovid Curd DPM  Encounter Date: 02/26/2017      PT End of Session - 02/26/17 1651    Visit Number 7   Number of Visits 16   Date for PT Re-Evaluation 03/04/17   PT Start Time 1650   PT Stop Time 1725   PT Time Calculation (min) 35 min   Activity Tolerance Patient tolerated treatment well   Behavior During Therapy [facility] for tasks assessed/performed      Past Medical History:  Diagnosis Date  . Anemia   . Depression   . H/O scarlet fever   . Heart murmur   . History of chicken pox   . History of genital warts   . Hypertension   . Plantar fasciitis   . Rash due to allergy    Wheat allergy.  . Shingles     Past Surgical History:  Procedure Laterality Date  . ABDOMINAL HYSTERECTOMY  2005  . KNEE SURGERY Left    2015  . VAGINAL DELIVERY     x2    There were no vitals filed for this visit.      Subjective Assessment - 02/26/17 1649    Subjective Patinet  reports she is feeling less pain in her ankle. she is noticing improvement in functional use with ambulation with less difficulty.    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 Yes   Pain Score 3    Pain Location Ankle   Pain Orientation Right   Pain Descriptors / Indicators Aching   Pain Type Acute pain;Surgical pain  10/2016   Pain Onset 1 to 4 weeks ago   Pain Frequency Intermittent       Objective:  Gait: ambulating independently with sneakers and orthotics without significant  deviations Observation: right ankle with healed incision posterior aspect and along calf, decreased soft tissue elasticity  Treatment: Therapeutic exercise: patient performed exercises with demonstration, instruction, assistance, VC of therapist: goal: independent HEP, improve foot ankle disability index Leg press 25# through short arc x 10; 45# x 10 reps through short arc with VC Balance system weight shifting side to side with visual and VC 2 min. / forward and back x 2 min. : patient having difficulty with transferring weight equally through both LE's with improvement noted with repetition Limits of stability low and medium and high skill levels 2 set each with platform unlocked #8 (accuracy >90% with repetition) Random control with moderate difficulty platform unlocked #9 x 2 min. Side stepping along balance beam x 2 min. With  Controlled motion and increased hip and knee flexion Step up and over balance pad x 10 reps Standing balance ball toss; un weighted ball: 10 reps with standing on airex balance pad staggered stance with each LE with LE in back position; 25 tosses each Supine: manual resistance for isometric PF in neutral position halfway between full PF and 0 DF 5 reps with instruction to perform with towel at home Sitting: active PF through pain free motion x 10 reps  Patient response to treatment: Improved  motor control with balance exercises with minimal VC. Patient demonstrated good technique with exercises for isometric PF and active PF in sitting.         PT Education - 02/26/17 1939    Education provided Yes   Education Details HEP add PF in sitting and isometric PF submaximal intensity   Person(s) Educated Patient   Methods Explanation;Demonstration;Verbal cues   Comprehension Verbalized understanding;Returned demonstration;Verbal cues required             PT Long Term Goals - 01/07/17 1733      PT LONG TERM GOAL #1   Title Patient will demonsrate improved  function with gait, daily activties with LEFS score of 40/80   Baseline LEFS 26/80   Status New   Target Date 02/06/17     PT LONG TERM GOAL #2   Title Patient will demonsrate improved function with gait, daily activties with LEFS score of 60/80   Baseline LEFS 26/80   Status New   Target Date 03/04/17     PT LONG TERM GOAL #3   Title Patient will be independent with home program for self management of pain and exercise for ROM and strengthening  in order to continue to progress towards full function once discharged from physical therapy   Baseline limited knowledge of appropriate exercise and progression    Status New   Target Date 03/04/17     PT LONG TERM GOAL #4   Title improved gait pattern with least restrictive AD in order to improve functional ambulation in community   Baseline using scooter, limited WB restriction s/p surgery   Status New   Target Date 03/04/17               Plan - 02/26/17 1740    Clinical Impression Statement Patient was able to tolerate exercises today with improvement noted in strength and balance with minimal soreness. She conitnues with decreased strength and ROM as she heals from surgery and will benefit from additional physical therapy intervention to achieve goals.    Rehab Potential Good   Clinical Impairments Affecting Rehab Potential (+) motivated, active lifestyle, acute condition    PT Frequency 2x / week   PT Duration 8 weeks   PT Treatment/Interventions Manual techniques;Neuromuscular re-education;Patient/family education;Cryotherapy;Therapeutic exercise;Moist Heat;Electrical Stimulation;Passive range of motion   PT Next Visit Plan manual STM, exercise per protocol for Achilles tendon repair and MD orders   PT Home Exercise Plan  add ice and elevation as needed; core strengthening, hip isometric strengthening: sitting scapular retraction bilateral and unilateral with resistive bands, palloff press to front, hip adduction with ball and  glute sets, hip abduction isometric holds      Patient will benefit from skilled therapeutic intervention in order to improve the following deficits and impairments:  Decreased strength, Pain, Decreased range of motion, Impaired flexibility, Impaired perceived functional ability, Decreased activity tolerance, Decreased endurance, Difficulty walking  Visit Diagnosis: Muscle weakness (generalized)  Pain in right ankle and joints of right foot  Difficulty in walking, not elsewhere classified  Stiffness of right ankle, not elsewhere classified     Problem List Patient Active Problem List   Diagnosis Date Noted  . Haglund's deformity of right heel 12/27/2016  . URI (upper respiratory infection) 12/23/2016  . Hyperlipidemia 01/09/2016  . Prediabetes 01/09/2016  . Preventative health care 01/09/2016  . Urinary incontinence 01/09/2016  . Achilles tendonitis, bilateral 10/24/2015  . Heel spur 10/24/2015  . Dermatitis of left eyelid 06/30/2015  . Closed  fracture of part of upper end of humerus 06/18/2015  . Iron deficiency anemia 12/29/2013  . Essential hypertension 12/29/2013  . Plantar fasciitis 12/29/2013  . Obesity (BMI 30.0-34.9) 12/29/2013    Carl Best 02/27/2017, 7:41 PM   [facility] PHYSICAL AND SPORTS MEDICINE [address] [place], [address] Phone: [phone]   Fax:  [phone]  Name: [name] MRN: [id number] Date of Birth: Nov 12, 1965

## 2017-02-27 ENCOUNTER — Encounter: Payer: BC Managed Care – PPO | Admitting: Physical Therapy

## 2017-03-03 ENCOUNTER — Ambulatory Visit (INDEPENDENT_AMBULATORY_CARE_PROVIDER_SITE_OTHER): Payer: BC Managed Care – PPO | Admitting: Podiatry

## 2017-03-03 ENCOUNTER — Encounter: Payer: Self-pay | Admitting: Podiatry

## 2017-03-03 DIAGNOSIS — Z9889 Other specified postprocedural states: Secondary | ICD-10-CM

## 2017-03-03 DIAGNOSIS — M9261 Juvenile osteochondrosis of tarsus, right ankle: Secondary | ICD-10-CM

## 2017-03-04 ENCOUNTER — Ambulatory Visit: Payer: BC Managed Care – PPO | Admitting: Physical Therapy

## 2017-03-04 ENCOUNTER — Encounter: Payer: Self-pay | Admitting: Physical Therapy

## 2017-03-04 DIAGNOSIS — M6281 Muscle weakness (generalized): Secondary | ICD-10-CM

## 2017-03-04 DIAGNOSIS — M25571 Pain in right ankle and joints of right foot: Secondary | ICD-10-CM

## 2017-03-04 IMAGING — MG MM DIGITAL SCREENING BILAT W/ CAD
4 series · 4 of 4 positions shown · non-contrast
Comparison: Previous exam(s).

CLINICAL DATA: Screening.

EXAM:
DIGITAL SCREENING BILATERAL MAMMOGRAM WITH CAD

[L CC]
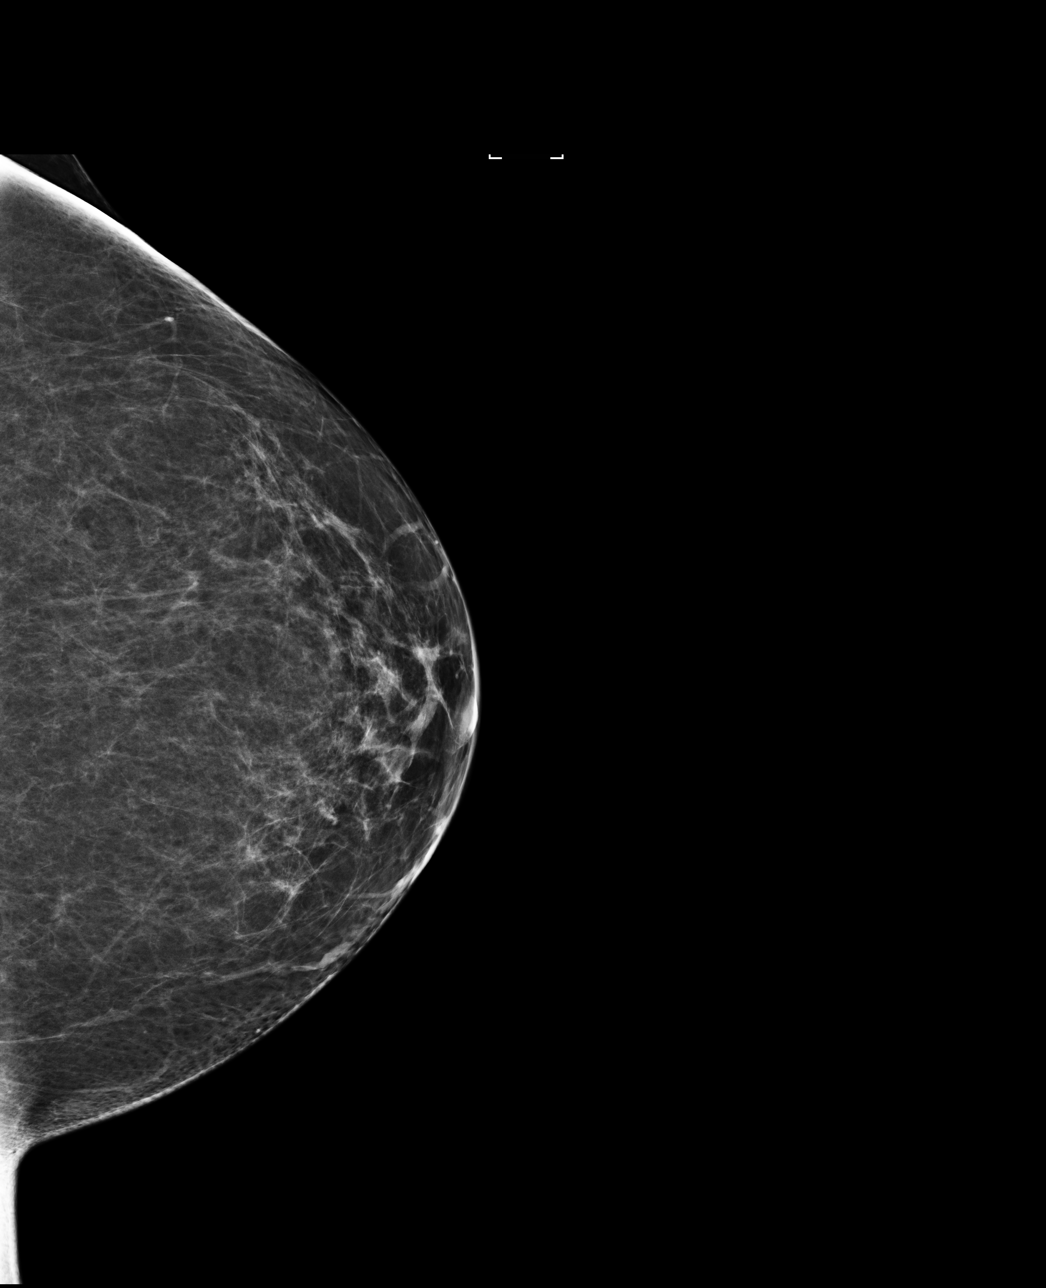

[R MLO]
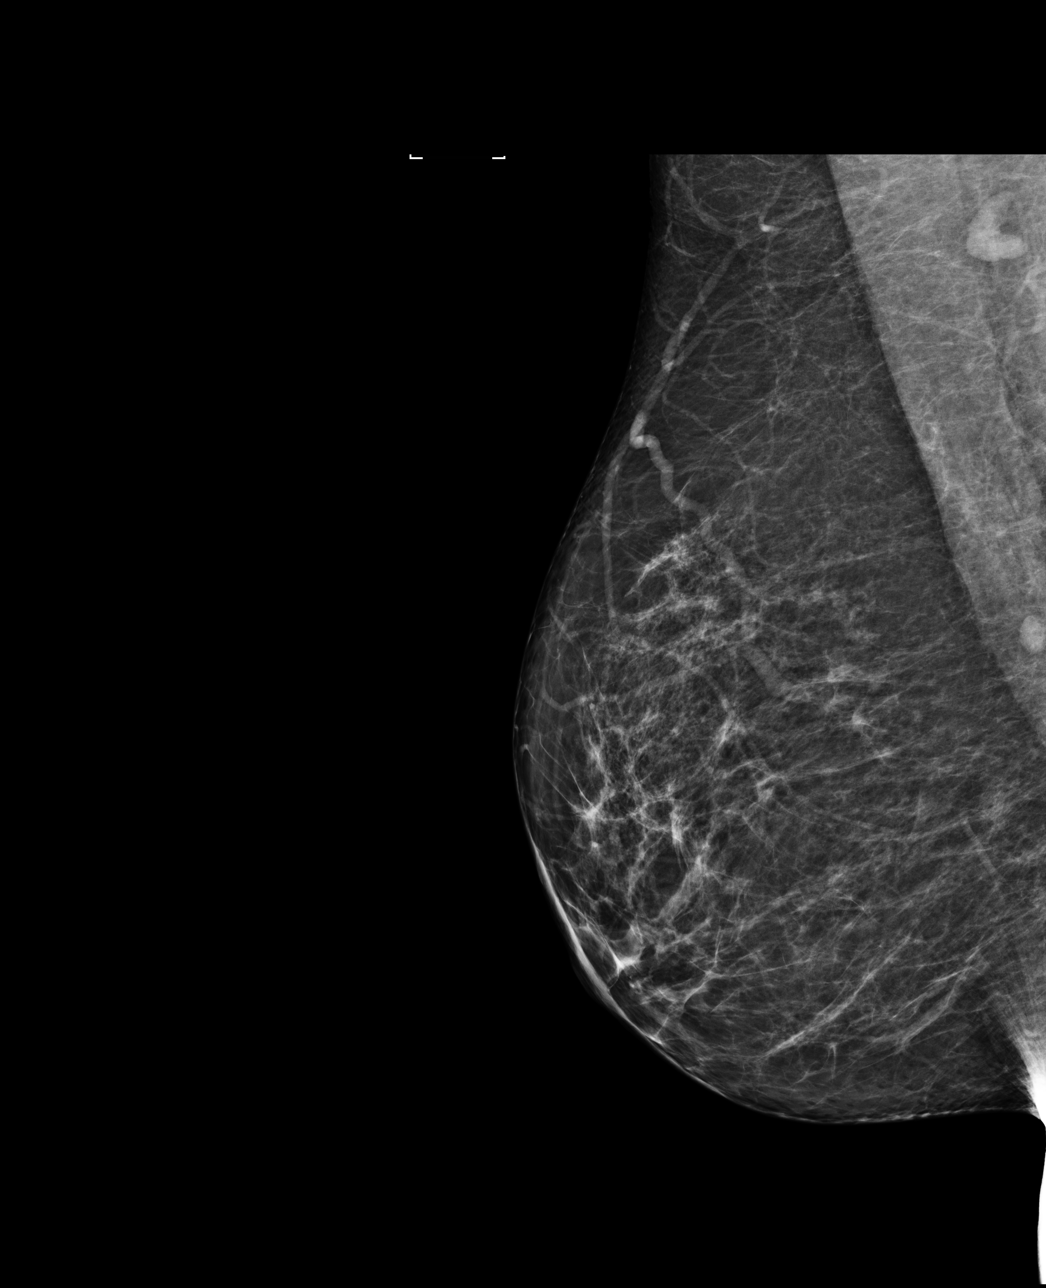

[L MLO]
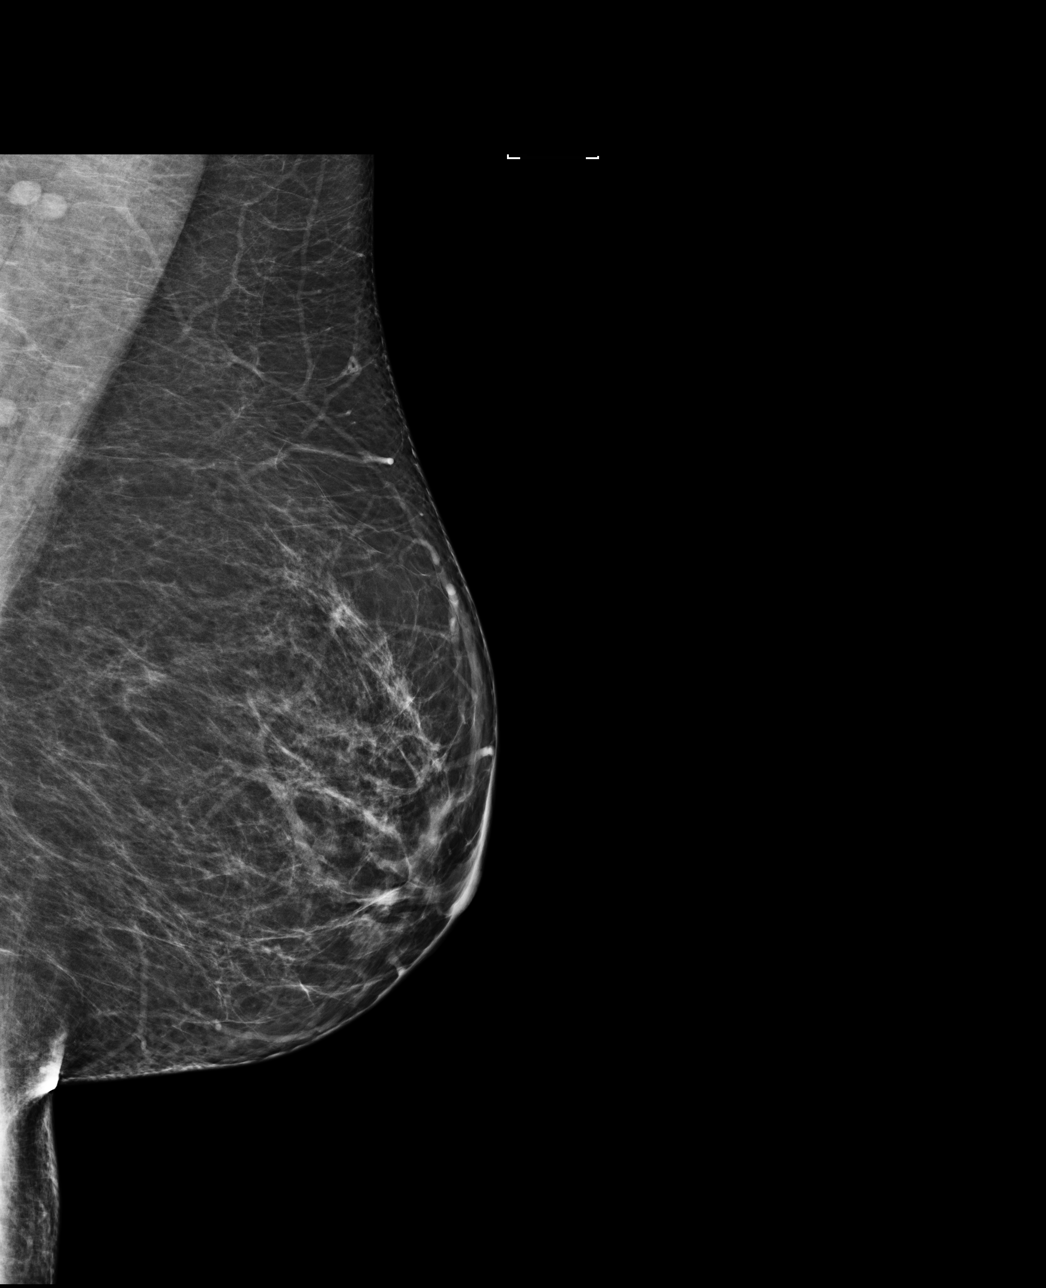

[R CC]
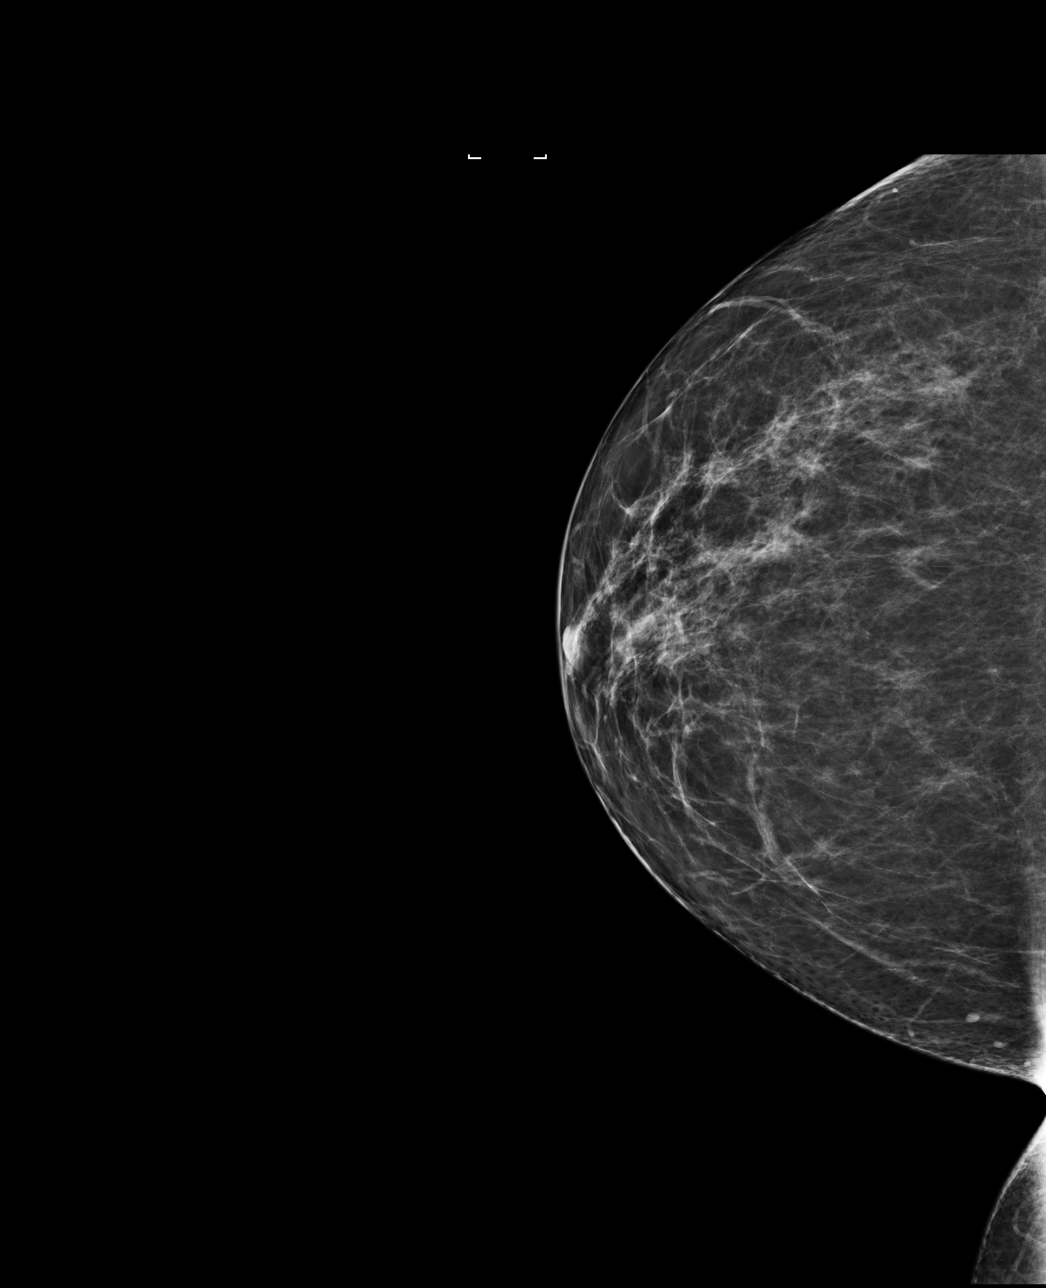

[4 of 4 positions shown; findings below may reference images not displayed]

ACR Breast Density Category b: There are scattered areas of
fibroglandular density.
FINDINGS: There are no findings suspicious for malignancy. Images were
processed with CAD.
IMPRESSION: No mammographic evidence of malignancy. A result letter of this
screening mammogram will be mailed directly to the patient.

RECOMMENDATION:
Screening mammogram in one year. (Code:AS-G-LCT)

BI-RADS CATEGORY  1: Negative.

## 2017-03-05 NOTE — Therapy (Signed)
[facility]ICINE [address] [place], [place], [address] Phone: [phone]   Fax:  [phone]  Physical Therapy Treatment  Patient Details  Name: Erica LassRebecca A Velazquez MRN: [id number] Date of Birth: 01/29/1966 Referring Provider: Ovid CurdWagoner, Matthew DPM  Encounter Date: 03/04/2017      PT End of Session - 03/04/17 1730    Visit Number 8   Number of Visits 16   Date for PT Re-Evaluation 03/04/17   PT Start Time 1617   PT Stop Time 1700   PT Time Calculation (min) 43 min   Activity Tolerance Patient tolerated treatment well   Behavior [facility] for tasks assessed/performed      Past Medical History:  Diagnosis Date  . Anemia   . Depression   . H/O scarlet fever   . Heart murmur   . History of chicken pox   . History of genital warts   . Hypertension   . Plantar fasciitis   . Rash due to allergy    Wheat allergy.  . Shingles     Past Surgical History:  Procedure Laterality Date  . ABDOMINAL HYSTERECTOMY  2005  . KNEE SURGERY Left    2015  . VAGINAL DELIVERY     x2    There were no vitals filed for this visit.      Subjective Assessment - 03/04/17 1621    Subjective Patient reports she is improving strength and able to walk and perform activities with less difficulty. She reports being seen by MD and discussed transitioning to independent home program and prepare for surgery on left ankle.   Limitations Standing;Walking;House hold activities;Other (comment)  work related activities as Runner, broadcasting/film/videoteacher   Patient Stated Goals To improve strength, ability to walk and stand without difficulty so she can have surgery on left ankle in the near future   Currently in Pain? Yes   Pain Score 2    Pain Location Ankle   Pain Orientation Right   Pain Descriptors / Indicators Sore   Pain Type Surgical pain  10/2016   Pain Onset More than a month ago   Pain Frequency Intermittent       Objective:  Gait:  ambulating independently with sneakers and orthotics without significant deviations Observation: right ankle with healed incision posterior aspect and along calf, decreased soft tissue elasticity  Treatment: Therapeutic exercise: patient performed exercises with demonstration, instruction, assistance, VC of therapist: goal: independent HEP, improve foot ankle disability index Instructed and patient returned demonstration in resistive band walking for forward, backwards and side stepping 5 reps each with taking 3 steps each  Modalities: Electrical stimulation: 20 min.: High volt estim.clincial program for muscle spasms  (2) electrodes applied to medial and lateral aspect of right ankle intensity to tolerance with patient in reclined position with LE  supported on pillow goal: pain, spasms  Patient response to treatment: Patient demonstrated good technique with resistive band walking with repetition, following demonstration. She had more difficulty stepping with control of right ankle for eversion and hip abduction. Decreased soreness to 0/10 at end of session, following estim.       PT Education - 03/04/17 1700    Education provided Yes   Education Details HEP with resistive band exercise walking   Person(s) Educated Patient   Methods Explanation;Demonstration;Verbal cues   Comprehension Verbalized understanding;Returned demonstration;Verbal cues required             PT Long Term Goals - 01/07/17 1733  PT LONG TERM GOAL #1   Title Patient will demonsrate improved function with gait, daily activties with LEFS score of 40/80   Baseline LEFS 26/80   Status New   Target Date 02/06/17     PT LONG TERM GOAL #2   Title Patient will demonsrate improved function with gait, daily activties with LEFS score of 60/80   Baseline LEFS 26/80   Status New   Target Date 03/04/17     PT LONG TERM GOAL #3   Title Patient will be independent with home program for self management of pain  and exercise for ROM and strengthening  in order to continue to progress towards full function once discharged from physical therapy   Baseline limited knowledge of appropriate exercise and progression    Status New   Target Date 03/04/17     PT LONG TERM GOAL #4   Title improved gait pattern with least restrictive AD in order to improve functional ambulation in community   Baseline using scooter, limited WB restriction s/p surgery   Status New   Target Date 03/04/17               Plan - 03/04/17 1730    Clinical Impression Statement Patient tolerated exercises well and advanced to resistive band walking. She is having less pain in ankle and continues with weakness in evertors and generalized LE weakness. She responded well to electrical stimulation for pain control and she should continue to improve with additional physical therapy intervention to achieve maximal results.    Rehab Potential Good   Clinical Impairments Affecting Rehab Potential (+) motivated, active lifestyle, acute condition    PT Frequency 2x / week   PT Duration 8 weeks   PT Treatment/Interventions Manual techniques;Neuromuscular re-education;Patient/family education;Cryotherapy;Therapeutic exercise;Moist Heat;Electrical Stimulation;Passive range of motion   PT Next Visit Plan manual STM, exercise per protocol for Achilles tendon repair and MD orders   PT Home Exercise Plan  add ice and elevation as needed; core strengthening, hip isometric strengthening: sitting scapular retraction bilateral and unilateral with resistive bands, palloff press to front, hip adduction with ball and glute sets, hip abduction isometric holds      Patient will benefit from skilled therapeutic intervention in order to improve the following deficits and impairments:  Decreased strength, Pain, Decreased range of motion, Impaired flexibility, Impaired perceived functional ability, Decreased activity tolerance, Decreased endurance,  Difficulty walking  Visit Diagnosis: Muscle weakness (generalized)  Pain in right ankle and joints of right foot     Problem List Patient Active Problem List   Diagnosis Date Noted  . Haglund's deformity of right heel 12/27/2016  . URI (upper respiratory infection) 12/23/2016  . Hyperlipidemia 01/09/2016  . Prediabetes 01/09/2016  . Preventative health care 01/09/2016  . Urinary incontinence 01/09/2016  . Achilles tendonitis, bilateral 10/24/2015  . Heel spur 10/24/2015  . Dermatitis of left eyelid 06/30/2015  . Closed fracture of part of upper end of humerus 06/18/2015  . Iron deficiency anemia 12/29/2013  . Essential hypertension 12/29/2013  . Plantar fasciitis 12/29/2013  . Obesity (BMI 30.0-34.9) 12/29/2013    Beacher May PT 03/05/2017, 4:30 PM  [facility] PHYSICAL AND SPORTS MEDICINE [address], [place], [address] Phone: [phone]   Fax:  [phone]  Name: STEPHANIE LITTMAN MRN: [id number] Date of Birth: July 03, 1965

## 2017-03-06 ENCOUNTER — Encounter: Payer: BC Managed Care – PPO | Admitting: Physical Therapy

## 2017-03-06 NOTE — Progress Notes (Signed)
Subjective: [name] is a 51 y.o. is seen today in office s/p right retrocalcaneal exostectomy, achilles tenolysis, gastroc recession preformed on 11/13/2016. She states that she is doing well. She recently has, trip to [place] and [place] and she is to a lot of walking up and down steps and walking in general however she did finish he overdid it at times but she is able to do daily activities more and are regular basis without any pain. She does continue to do physical therapy as well. Overall she feels that she has made good progress since last appointment in the incision is doing well and she is happy the results so far. She states that GERD feels better than she did prior to surgery. She has no concerns today. Denies any systemic complaints such as fevers, chills, nausea, vomiting. No calf pain, chest pain, shortness of breath.   Objective: General: No acute distress, AAOx3  DP/PT pulses palpable 2/4, CRT < 3 sec to all digits.  Protective sensation intact. Motor function intact.  Right foot: Incision is well coapted without any evidence of dehiscence and the incision is well-healed with a scar. There is no subcutaneous tenderness palpation of the surgical sites. There is no pain with lateral compression of calcaneus. Is no pain on the course or insertion of the plantar fascia. Achilles tendon is intact and Thompson test is negative. MMT 5/5. Range of motion intact. No other areas of tenderness identified bilaterally. No open lesions or pre-ulcerative lesions are then applied. There is no pain with calf compression, swelling, warmth, erythema.  Assessment and Plan:  Status post right foot surgery improving,   -Treatment options discussed including all alternatives, risks, and complications -This point she is doing well. I want her to finish physical therapy and she has another 1-2 weeks left. As that continues home exercises. She's back toward regular shoe and doing daily activities  and I recommended her to gradually increase her activity level. Continue ice and elevation the compression socks that she has been wearing. -Posterior back in the next 6 weeks as he is doing will likely due to surgical consultation on the left foot. She would like to do this in December on the left foot. I want make sure that her right foot is doing well before proceeding with surgery in the left.  [name], DPM

## 2017-03-11 ENCOUNTER — Ambulatory Visit: Payer: BC Managed Care – PPO | Admitting: Physical Therapy

## 2017-03-11 DIAGNOSIS — M25571 Pain in right ankle and joints of right foot: Secondary | ICD-10-CM

## 2017-03-11 DIAGNOSIS — M6281 Muscle weakness (generalized): Secondary | ICD-10-CM

## 2017-03-11 DIAGNOSIS — M25671 Stiffness of right ankle, not elsewhere classified: Secondary | ICD-10-CM

## 2017-03-11 DIAGNOSIS — R262 Difficulty in walking, not elsewhere classified: Secondary | ICD-10-CM

## 2017-03-11 NOTE — Therapy (Signed)
[facility] [address], [place], [address] Phone: [phone]   Fax:  [phone]  Physical Therapy Discharge Summary  Patient Details  Name: [name] MRN: [id number] Date of Birth: March 12, 1966 Referring Provider: Ovid Curd DPM  Encounter Date: 03/11/2017   Patient began physical therapy on 01/07/2017 and has attended 8 sessions through 03/04/2017. She has achieved all goals and is independent in home program for continued self management of pain/symptoms and exercises as instructed. Plan discharge from physical therapy at this time.      Past Medical History:  Diagnosis Date  . Anemia   . Depression   . H/O scarlet fever   . Heart murmur   . History of chicken pox   . History of genital warts   . Hypertension   . Plantar fasciitis   . Rash due to allergy    Wheat allergy.  . Shingles     Past Surgical History:  Procedure Laterality Date  . ABDOMINAL HYSTERECTOMY  2005  . KNEE SURGERY Left    2015  . VAGINAL DELIVERY     x2    There were no vitals filed for this visit.      Subjective Assessment - 03/11/17 1619    Subjective Patient is doing well and feels she is getting stronger with resistive band walking and pool exercises.   Limitations Standing;Walking;House hold activities;Other (comment)  work related standing/walking   Patient Stated Goals To improve strength, ability to walk and stand without difficulty so she can have surgery on left ankle in the near future   Currently in Pain? No/denies      Outcome measure: LEFS 63/80 (80 = no self perceived disability); foot/ankle disability index  AROM: Right ankle DF 10; PF 35 degrees Gait; ambulating without assistive device without deviations Palpation; incision/scars right ankle soft and mobile without tenderness Strength: improving all major muscle groups right LE/ankle at least 4/5 without pain single leg stand:  patient able to stand on each LE without UE support for > 10 seconds        PT Education - 03/11/17 1621    Education provided Yes   Education Details re assessed home exercises   Person(s) Educated Patient   Methods Explanation   Comprehension Verbalized understanding             PT Long Term Goals - 03/11/17 1702      PT LONG TERM GOAL #1   Title Patient will demonsrate improved function with gait, daily activties with LEFS score of 40/80   Baseline LEFS 26/80/ LEFS 63/80    Status Achieved     PT LONG TERM GOAL #2   Title Patient will demonsrate improved function with gait, daily activties with LEFS score of 60/80   Baseline LEFS 26/80: LEFS 03/11/17 63/80   Status Achieved     PT LONG TERM GOAL #3   Title Patient will be independent with home program for self management of pain and exercise for ROM and strengthening  in order to continue to progress towards full function once discharged from physical therapy   Baseline limited knowledge of appropriate exercise and progression    Status Achieved     PT LONG TERM GOAL #4   Title improved gait pattern with least restrictive AD in order to improve functional ambulation in community   Baseline using scooter, limited WB restriction s/p surgery   Status Achieved  Plan - 03/11/17 1701    Clinical Impression Statement patient is doing well and has achieved all goals. she should continue to progress with independent self management and home program as instructed.   Rehab Potential Good   Clinical Impairments Affecting Rehab Potential (+) motivated, active lifestyle, acute condition    PT Frequency 2x / week   PT Duration 8 weeks   PT Treatment/Interventions Manual techniques;Neuromuscular re-education;Patient/family education;Cryotherapy;Therapeutic exercise;Moist Heat;Electrical Stimulation;Passive range of motion   PT Next Visit Plan Discharge from physical therapy   PT Home Exercise Plan  continue  with ROM, strengthening; resistive band walking, pool exercises and progressive strengthening      Patient will benefit from skilled therapeutic intervention in order to improve the following deficits and impairments:  Decreased strength, Pain, Decreased range of motion, Impaired flexibility, Impaired perceived functional ability, Decreased activity tolerance, Decreased endurance, Difficulty walking  Visit Diagnosis: Muscle weakness (generalized)  Pain in right ankle and joints of right foot  Difficulty in walking, not elsewhere classified  Stiffness of right ankle, not elsewhere classified     Problem List Patient Active Problem List   Diagnosis Date Noted  . Haglund's deformity of right heel 12/27/2016  . URI (upper respiratory infection) 12/23/2016  . Hyperlipidemia 01/09/2016  . Prediabetes 01/09/2016  . Preventative health care 01/09/2016  . Urinary incontinence 01/09/2016  . Achilles tendonitis, bilateral 10/24/2015  . Heel spur 10/24/2015  . Dermatitis of left eyelid 06/30/2015  . Closed fracture of part of upper end of humerus 06/18/2015  . Iron deficiency anemia 12/29/2013  . Essential hypertension 12/29/2013  . Plantar fasciitis 12/29/2013  . Obesity (BMI 30.0-34.9) 12/29/2013    Beacher MayBrooks, Marie PT 03/11/2017, 5:05 PM  [facility] [address] [place], [place], [id number] Phone: [phone]   Fax:  [phone]  Name: Kathyrn LassRebecca A Schnackenberg MRN: [id number] Date of Birth: [birth year]

## 2017-03-13 ENCOUNTER — Encounter: Payer: BC Managed Care – PPO | Admitting: Physical Therapy

## 2017-03-18 ENCOUNTER — Ambulatory Visit: Payer: BC Managed Care – PPO | Admitting: Physical Therapy

## 2017-03-25 ENCOUNTER — Encounter: Payer: BC Managed Care – PPO | Admitting: Physical Therapy

## 2017-04-01 ENCOUNTER — Encounter: Payer: BC Managed Care – PPO | Admitting: Physical Therapy

## 2017-04-07 ENCOUNTER — Ambulatory Visit: Payer: BC Managed Care – PPO | Admitting: Podiatry

## 2017-04-07 DIAGNOSIS — M9261 Juvenile osteochondrosis of tarsus, right ankle: Secondary | ICD-10-CM

## 2017-04-07 DIAGNOSIS — M216X2 Other acquired deformities of left foot: Secondary | ICD-10-CM

## 2017-04-07 DIAGNOSIS — M7662 Achilles tendinitis, left leg: Secondary | ICD-10-CM

## 2017-04-07 NOTE — Patient Instructions (Signed)

## 2017-04-08 ENCOUNTER — Encounter: Payer: BC Managed Care – PPO | Admitting: Physical Therapy

## 2017-04-14 ENCOUNTER — Ambulatory Visit: Payer: BC Managed Care – PPO | Admitting: Podiatry

## 2017-04-14 NOTE — Progress Notes (Signed)
Subjective: [name] presents the office today for follow-up evaluation status post right foot surgery and presurgical consultation left foot.  She is to the right side is doing much better she has been walking and she is having no pain with daily activities.  She states that her left foot has been hurting a lot more than the right side and this is what is limiting her from doing her activities.  At this point after doing multiple conservative treatments, she is requesting surgical intervention on the left foot similar to what we did on the right side.  She continues to have pain on a daily basis to the left heel.  She is attempted numerous conservative treatments including, but not limited to shoe gear modifications, offloading, padding, immobilization without any significant improvement. Denies any systemic complaints such as fevers, chills, nausea, vomiting. No acute changes since last appointment, and no other complaints at this time.   Objective: AAO x3, NAD DP/PT pulses palpable bilaterally, CRT less than 3 seconds Right foot: Incisions are all well healed with a scar.  There is no significant edema.  There is no erythema or increased warmth.  There is no tenderness to palpation of the surgical sites.  Ankle, subtalar joint range of motion intact.  There is no tenderness to the right lower extremity. Left foot: Equinus is present.  There is tenderness along a large posterior retrocalcaneal exostosis on the distal portion of the Achilles tendon.  Thompson test is negative and Achilles tendon appears to be intact.  No pain with lateral compression of the calcaneus.  There is trace edema to the posterior aspect of the heel but there is no erythema or increased warmth. No open lesions or pre-ulcerative lesions.  No pain with calf compression, swelling, warmth, erythema  Assessment: Resolved right foot pain status post surgery with left heel pain/Achilles tendinitis, posterior spurring with  equinus  Plan: -All treatment options discussed with the patient including all alternatives, risks, complications.  -In regards to the right foot she is doing well she is having no problems.  She is able to do her daily activities.  Only discharge her in the postoperative course in the right side. -In regard to the left foot she is attempted numerous conservative treatments at this point she is requesting surgical intervention.  We discussed surgical options.  We elected to proceed with gastrocnemius recession, heel spur resection with Achilles tendon lysis on the left foot.  We discussed the surgery as well as postoperative course.  She agrees to proceed with surgery and she is asking for surgery at this point. The incision placement as well as the postoperative course was discussed with the patient. I discussed risks of the surgery which include, but not limited to, infection, bleeding, pain, swelling, need for further surgery, delayed or nonhealing, painful or ugly scar, numbness or sensation changes, over/under correction, recurrence, transfer lesions, further deformity, hardware failure, DVT/PE, loss of toe/foot. Patient understands these risks and wishes to proceed with surgery. The surgical consent was reviewed with the patient all 3 pages were signed. No promises or guarantees were given to the outcome of the procedure. All questions were answered to the best of my ability. Before the surgery the patient was encouraged to call the office if there is any further questions. [facility] an outpatient basis. -Lovenox postop -Patient encouraged to call the office with any questions, concerns, change in symptoms.   [name] DPM

## 2017-04-15 ENCOUNTER — Encounter: Payer: BC Managed Care – PPO | Admitting: Physical Therapy

## 2017-04-21 ENCOUNTER — Encounter: Payer: Self-pay | Admitting: Podiatry

## 2017-04-21 ENCOUNTER — Ambulatory Visit (INDEPENDENT_AMBULATORY_CARE_PROVIDER_SITE_OTHER): Payer: BC Managed Care – PPO | Admitting: Podiatry

## 2017-04-21 ENCOUNTER — Ambulatory Visit (INDEPENDENT_AMBULATORY_CARE_PROVIDER_SITE_OTHER): Payer: BC Managed Care – PPO

## 2017-04-21 ENCOUNTER — Telehealth: Payer: Self-pay | Admitting: *Deleted

## 2017-04-21 DIAGNOSIS — M216X2 Other acquired deformities of left foot: Secondary | ICD-10-CM

## 2017-04-21 DIAGNOSIS — M7662 Achilles tendinitis, left leg: Secondary | ICD-10-CM | POA: Diagnosis not present

## 2017-04-21 DIAGNOSIS — M7730 Calcaneal spur, unspecified foot: Secondary | ICD-10-CM

## 2017-04-21 NOTE — Progress Notes (Signed)
This patient presents to the officeto have an x-ray taken of her left foot.  She is a Dr. Ardelle AntonWagoner patient and she presents the office today to have a preop x-ray performed.   Neurovascular status is the same as the previous visit   Achilles tendinitis  Retrocalcaneal bursitis left foot  ROV  X-rays reveal calcification at the insertion of the Achilles tendon left foot.  These x-rays were put into the system and will be used by Dr. Ardelle AntonWagoner during the surgical procedure.   Helane GuntherGregory Demarri Elie DPM

## 2017-04-21 NOTE — Telephone Encounter (Signed)
I am calling to let you know that Dr. Ardelle AntonWagoner would like for you to go by the [facility] to get some updated x-rays prior to your surgery.  "Do I need to make an appointment to do that?"  No, you can just stop by to have them taken.  "Okay, I'll try to do it sometime this week."

## 2017-04-21 NOTE — Telephone Encounter (Signed)
Patient presented to office for xray only per Dr. Ardelle AntonWagoner.  Xray was performed on left foot, reviewed by Dr. Stacie AcresMayer.

## 2017-04-28 ENCOUNTER — Encounter: Payer: Self-pay | Admitting: Podiatry

## 2017-04-29 ENCOUNTER — Other Ambulatory Visit: Payer: Self-pay | Admitting: Podiatry

## 2017-04-29 ENCOUNTER — Telehealth: Payer: Self-pay | Admitting: *Deleted

## 2017-04-29 MED ORDER — OXYCODONE-ACETAMINOPHEN 5-325 MG PO TABS: 1.0000 | ORAL_TABLET | ORAL | 0 refills | Status: DC | PRN

## 2017-04-29 MED ORDER — CEPHALEXIN 500 MG PO CAPS: 500.0000 mg | ORAL_CAPSULE | Freq: Three times a day (TID) | ORAL | 2 refills | Status: DC

## 2017-04-29 MED ORDER — PROMETHAZINE HCL 25 MG PO TABS: 25.0000 mg | ORAL_TABLET | Freq: Three times a day (TID) | ORAL | 0 refills | Status: DC | PRN

## 2017-04-29 NOTE — Progress Notes (Signed)
Rx provided for postop

## 2017-04-29 NOTE — Telephone Encounter (Signed)
"  I am calling to see if I can get my pain medication prescription today.  I sent Dr. Ardelle AntonWagoner an email but he did not respond.  I have some pain medication from the last time but I don't know if he's prescribing the same thing.  If it's going to be the same thing I can just wait and get it tomorrow."  Dr. Ardelle AntonWagoner said he will escribe it for you today.  You should be able to pick it up at the pharmacy.  "I thought prescriptions couldn't be called into the pharmacy."  They have a new system they are using now.  "Okay great, I will call the pharmacy later."  What pharmacy do you want it to go to?  "[facility] [address] [facility] [place]  "I called the pharmacy and they said they didn't have a prescription from Dr. Ardelle AntonWagoner for me."  Dr. Ardelle AntonWagoner said he tried to send it and their system said they were not accepting escribed prescriptions.  He said he's giving you the same medication as before so you don't necessarily have to get the prescription filled tomorrow.  "Okay, thank you so much."

## 2017-04-30 ENCOUNTER — Encounter: Payer: Self-pay | Admitting: Podiatry

## 2017-04-30 DIAGNOSIS — M7732 Calcaneal spur, left foot: Secondary | ICD-10-CM | POA: Diagnosis not present

## 2017-04-30 DIAGNOSIS — M216X2 Other acquired deformities of left foot: Secondary | ICD-10-CM | POA: Diagnosis not present

## 2017-04-30 DIAGNOSIS — M65872 Other synovitis and tenosynovitis, left ankle and foot: Secondary | ICD-10-CM | POA: Diagnosis not present

## 2017-04-30 NOTE — Progress Notes (Signed)
Pre-operative Note  [facility] today for surgical intervention of the LEFT foot for resection of posterior heel spur, achilles tenolysis, gastroc recession. The surgical consent was reviewed with the patient and we discussed the procedure as well as the postoperative course. I again discussed all alternatives, risks, complications. I answered all of their questions to the best of my ability and they wish to proceed with surgery. No promises or guarantees were given as to the outcome of the surgery.   The surgical consent was signed.   Patient is NPO since midnight.  The patient does not have have a history of blood clots or bleeding disorders.   Cancelled the medications I prescribed yesterday. Will do Demerol for postop pain, Keflex and Lovenox. Did not prescribe phenergan at her request and she is sensitive to codeine so changed to demerol.   No further questions.   Ovid CurdMatthew Wagoner, DPM [facility]

## 2017-05-01 ENCOUNTER — Encounter: Payer: Self-pay | Admitting: Podiatry

## 2017-05-02 ENCOUNTER — Telehealth: Payer: Self-pay | Admitting: *Deleted

## 2017-05-02 NOTE — Telephone Encounter (Signed)
Called and spoke with the patient and stated that I was checking on her after the surgery on 04/30/17 and patient stated she was doing good and there was elevating and icing my foot and no fever or chills and I stated to call the [place] office [phone] if any concerns or questions. Misty StanleyLisa

## 2017-05-05 ENCOUNTER — Ambulatory Visit (INDEPENDENT_AMBULATORY_CARE_PROVIDER_SITE_OTHER): Payer: BC Managed Care – PPO

## 2017-05-05 ENCOUNTER — Ambulatory Visit (INDEPENDENT_AMBULATORY_CARE_PROVIDER_SITE_OTHER): Payer: BC Managed Care – PPO | Admitting: Podiatry

## 2017-05-05 ENCOUNTER — Encounter: Payer: Self-pay | Admitting: Podiatry

## 2017-05-05 DIAGNOSIS — M7662 Achilles tendinitis, left leg: Secondary | ICD-10-CM

## 2017-05-05 NOTE — Progress Notes (Signed)
Subjective: [name] is a 51 y.o. is seen today in office s/p left foot posterior heel spur resection with Achilles tendon lysis and gastrocnemius recession preformed on 04/30/2017. They state their pain is improved.  She has not been taking any pain medication.  She does take an occasional ibuprofen.  She has been taking the antibiotic.  She is also been to the Lovenox daily.  She has been nonweightbearing denies any recent injury or falls since the surgery.  She feels the cast is fitting well and not causing any issues.. Denies any systemic complaints such as fevers, chills, nausea, vomiting. No calf pain, chest pain, shortness of breath.   Objective: General: No acute distress, AAOx3  DP/PT pulses palpable 2/4, CRT < 3 sec to all digits.  Protective sensation intact. Motor function intact.  Cast is intact.  There is no pain with calf compression, swelling, warmth, erythema and the calf is supple.  Motor function intact to all the digits with immediate capillary refill time to all the toes.  Sensation intact to the digits as well.  Assessment and Plan:  Status post left foot surgery, doing well with no complications   -Treatment options discussed including all alternatives, risks, and complications -X-rays were obtained and reviewed.  Status post posterior heel spur resection without any complicating factors. -I left the cast in place this is fitting well and she is not having any pain or any systemic signs of infection. -Ice/elevation -Pain medication as needed. -Finished Lovenox.  Recommend to hold off on anti-inflammatories while taking this medication. -Monitor for any clinical signs or symptoms of infection and DVT/PE and directed to call the office immediately should any occur or go to the ER. -Follow-up in 1 week for cast change or sooner if any problems arise. In the meantime, encouraged to call the office with any questions, concerns, change in symptoms.   Ovid CurdMatthew Wagoner,  DPM

## 2017-05-06 ENCOUNTER — Telehealth: Payer: Self-pay | Admitting: Podiatry

## 2017-05-06 NOTE — Telephone Encounter (Signed)
I called the patient back at 5:48pm. She states when she got out of the shower a very small amount of water went down the back of her leg but he cast is not wet and she does not feel the bandage is wet. She is doing well otherwise. Discussed with her that the small amount of water should not be an issue but I am more than happy to change the cast for her. I will be around the office tomorrow afternoon and she can come in to have it changed. She states that she would think about it. Monitor for any signs or symptoms of infection. She verbalized understanding.

## 2017-05-06 NOTE — Telephone Encounter (Signed)
I just had heel spur reduction surgery by Dr. Ardelle AntonWagoner and I am in a cast. I taped off my cast and sat on a shower chair. Somehow, when I was getting out of the shower a single trickle of water went down into the cast. I just need to know what I need to do about it. Its really a small amount and there is no part on the cast that feels wet at all. I just wanted to make sure I'm doing what I need to be doing. Please call me back at [phone]. Thank you.

## 2017-05-07 ENCOUNTER — Ambulatory Visit (INDEPENDENT_AMBULATORY_CARE_PROVIDER_SITE_OTHER): Payer: BC Managed Care – PPO | Admitting: Podiatry

## 2017-05-07 DIAGNOSIS — Z9889 Other specified postprocedural states: Secondary | ICD-10-CM

## 2017-05-07 DIAGNOSIS — M7662 Achilles tendinitis, left leg: Secondary | ICD-10-CM

## 2017-05-08 ENCOUNTER — Ambulatory Visit: Payer: BC Managed Care – PPO

## 2017-05-14 ENCOUNTER — Encounter: Payer: BC Managed Care – PPO | Admitting: Podiatry

## 2017-05-16 ENCOUNTER — Encounter: Payer: Self-pay | Admitting: Podiatry

## 2017-05-16 ENCOUNTER — Ambulatory Visit (INDEPENDENT_AMBULATORY_CARE_PROVIDER_SITE_OTHER): Payer: BC Managed Care – PPO | Admitting: Podiatry

## 2017-05-16 DIAGNOSIS — M7662 Achilles tendinitis, left leg: Secondary | ICD-10-CM

## 2017-05-21 NOTE — Progress Notes (Signed)
Subjective: [name] is a 52 y.o. is seen today in office s/p left foot posterior heel spur resection with Achilles tendon lysis and gastrocnemius recession preformed on 04/30/2017.  She states that she is doing well she is having minimal pain.  She presents today for possible suture removal as well as for cast change and wound evaluation.  She did stop taking the Lovenox because she wanted to drink wine over the holidays and she felt that she was moving around enough that she did not need to take Lovenox.  She has no other concerns today.   Denies any systemic complaints such as fevers, chills, nausea, vomiting. No calf pain, chest pain, shortness of breath.   Objective: General: No acute distress, AAOx3  DP/PT pulses palpable 2/4, CRT < 3 sec to all digits.  Protective sensation intact. Motor function intact.  Cast was removed. Left foot: Incisions are healing well with sutures and staples intact.  There is no surrounding erythema, ascending sialitis.  There is no fluctuance or crepitus.  There is no malodor.  There is no clinical signs of infection noted.  Thompson test is negative and Achilles tendon appears to be intact.  There is no significant discomfort to palpation along the surgical site. No open lesions or pre-ulcerative lesions otherwise. There is no pain with calf compression, swelling, warmth, erythema the calf is supple.  No signs of DVT present.  Assessment and Plan:  Status post left foot surgery, doing well with no complications   -Treatment options discussed including all alternatives, risks, and complications -Cast was removed the sutures from the gastrocnemius recession site were removed as well as the proximal incision but the rest of the sutures and staples remained intact.  Antibiotic ointment was applied followed by a bandage.  A well-padded below-knee fiberglass cast was applied making sure to pad all bony prominences. -Continue nonweightbearing -Ice and  elevation -Aspirin daily -Monitor for any clinical signs or symptoms of infection and directed to call the office immediately should any occur or go to the ER. -Follow-up in 2 weeks or sooner if needed  Vivi BarrackMatthew R Wagoner DPM

## 2017-05-27 NOTE — Progress Notes (Signed)
Patient presents s/p left foot posterior heel spur resection with Achilles tendon lysis and gastrocnemius recession preformed on 04/30/2017.  She states that she is doing well she is having minimal pain but she did get water inside her cast and would like for it to be changed.    Cast removed, dressing removed, noted well healing surgical site, mild swelling WNL for post operative state. No drainage, no redness, or other s/s of infection.    Below the knee cast was applied with optimal positioning of foot. Patient expressed comfort with cast and positioning. Advised to watch for s/s of DVT. Continue taking ASA daily.   She is to follow up in 1 week for her post op appt. Sooner if any problems arise

## 2017-06-02 ENCOUNTER — Ambulatory Visit (INDEPENDENT_AMBULATORY_CARE_PROVIDER_SITE_OTHER): Payer: BC Managed Care – PPO | Admitting: Podiatry

## 2017-06-02 DIAGNOSIS — M216X2 Other acquired deformities of left foot: Secondary | ICD-10-CM

## 2017-06-02 DIAGNOSIS — M7662 Achilles tendinitis, left leg: Secondary | ICD-10-CM | POA: Diagnosis not present

## 2017-06-02 NOTE — Progress Notes (Signed)
Subjective: [name] is a 52 y.o. is seen today in office s/p left foot posterior heel spur resection with Achilles tendolysis and gastrocnemius recession preformed on 04/30/2017. She states that she is doing well and she is not taking any pain medication. She has been NWB. She denies any recent injury or trauma.  She has no other concerns today.  Denies any systemic complaints such as fevers, chills, nausea, vomiting. No calf pain, chest pain, shortness of breath.   Objective: General: No acute distress, AAOx3  DP/PT pulses palpable 2/4, CRT < 3 sec to all digits.  Protective sensation intact. Motor function intact.  Cast was removed. Left foot: Incisions are healing well with sutures and staples intact as well as sutures to the Achilles tendon repair site.  There is no surrounding erythema, ascending cellulitis.  There is no fluctuance or crepitus.  There is no malodor.  There is no clinical signs of infection noted.  Thompson test is negative and Achilles tendon appears to be intact.  There is no significant discomfort to palpation along the surgical site.  Mild edema overall appears to be improved.  There is no tenderness and she appears to be doing well. No open lesions or pre-ulcerative lesions otherwise. There is no pain with calf compression, swelling, warmth, erythema the calf is supple.  No signs of DVT present.  Assessment and Plan:  Status post left foot surgery, doing well with no complications   -Treatment options discussed including all alternatives, risks, and complications -Cast was removed.  Sutures were removed from the Achilles tendon repair site.  I also remove the staples in the gastrocnemius and the superior portion of the incision.  I left the inferior staples intact just to help prevent any wound problems although it appears to be healing well and appears to be healed.  Antibiotic ointment was applied followed by a bandage.  She can start to shower tomorrow. -Cam  boot was dispensed.  I also want her to start some gradual range of motion, rehab exercises.  She will start partial weightbearing around the house this weekend -Ice and elevation -Aspirin daily -Monitor for any clinical signs or symptoms of infection and directed to call the office immediately should any occur or go to the ER. -Follow-up in 10 days or sooner if needed  Vivi BarrackMatthew R Windsor Zirkelbach DPM

## 2017-06-16 ENCOUNTER — Ambulatory Visit (INDEPENDENT_AMBULATORY_CARE_PROVIDER_SITE_OTHER): Payer: BC Managed Care – PPO | Admitting: Podiatry

## 2017-06-16 DIAGNOSIS — M7730 Calcaneal spur, unspecified foot: Secondary | ICD-10-CM

## 2017-06-16 DIAGNOSIS — M7662 Achilles tendinitis, left leg: Secondary | ICD-10-CM

## 2017-06-16 DIAGNOSIS — M216X2 Other acquired deformities of left foot: Secondary | ICD-10-CM

## 2017-06-17 NOTE — Progress Notes (Signed)
Subjective: [name] is a 52 y.o. is seen today in office s/p left foot posterior heel spur resection with Achilles tendolysis and gastrocnemius recession preformed on 04/30/2017.  She states that she is doing very well.  She is remained in the cam boot.  She states that she is ready get staples out as they are causing irritation of the incision is healed well.  She states that overall the left foot is healing better than the right foot..  She has remained nonweightbearing.  She does continue ice and elevate.  She does not take any pain medication.  She does try to keep her foot elevated in between classes at work and she got a recliner to put in the office.  She has no other concerns today.  Denies any systemic complaints such as fevers, chills, nausea, vomiting. No calf pain, chest pain, shortness of breath.   Objective: General: No acute distress, AAOx3  DP/PT pulses palpable 2/4, CRT < 3 sec to all digits.  Protective sensation intact. Motor function intact.  Cast was removed. Left foot: Incisions are healing well with staples intact the distal portion of the incision.  The incision is healed well there is a scar overlying the area.  There is minimal edema there is no surrounding erythema and there is no ascending cellulitis.  There is no drainage or pus there is no clinical signs of infection.  Achilles tendon appears to be intact.  No pain with lateral compression of the calcaneus.  No other areas of tenderness. No open lesions or pre-ulcerative lesions otherwise. There is no pain with calf compression, swelling, warmth, erythema the calf is supple.  No signs of DVT present.  Assessment and Plan:  Status post left foot surgery, doing well with no complications   -Treatment options discussed including all alternatives, risks, and complications -I remove the staples today without any complications.  Antibiotic ointment was applied followed by a bandage.  She can start to shower tomorrow  again.  I want her to continue antibiotic ointment for the next couple days and then she can switch to using some cocoa butter or vitamin E cream.  Also want her to continue on the right side as well to help with scarring over the next several months. -We discussed the physical therapy.  She feels that she can do this at home as she did physical therapy the other side recently.  She is in start on therapy on her own at home.  She is in a gradual start to transition to partial weightbearing in the next 2 weeks in the left foot.  She is to continue nonweightbearing at work.  I will see her back in 2-3 weeks and hopefully we can just start to walk in the cam boot.  Although she feels good I do not want her to push it. -Monitor for any clinical signs or symptoms of infection and directed to call the office immediately should any occur or go to the ER. -Follow-up in 2-3 weeks or sooner if needed.  Call any questions or concerns.  She agrees with this plan has no further questions.  Vivi BarrackMatthew R Wagoner DPM

## 2017-06-20 NOTE — Progress Notes (Signed)
DOS 12.12.18 Lt foot achilles tenolysis, heel spur removal from back of heel, gastroc recession

## 2017-06-25 ENCOUNTER — Other Ambulatory Visit: Payer: Self-pay | Admitting: Primary Care

## 2017-06-25 DIAGNOSIS — I10 Essential (primary) hypertension: Secondary | ICD-10-CM

## 2017-07-02 ENCOUNTER — Encounter: Payer: Self-pay | Admitting: Podiatry

## 2017-07-07 ENCOUNTER — Encounter: Payer: Self-pay | Admitting: Podiatry

## 2017-07-07 ENCOUNTER — Ambulatory Visit (INDEPENDENT_AMBULATORY_CARE_PROVIDER_SITE_OTHER): Payer: BC Managed Care – PPO | Admitting: Podiatry

## 2017-07-07 DIAGNOSIS — M7662 Achilles tendinitis, left leg: Secondary | ICD-10-CM

## 2017-07-07 DIAGNOSIS — M7730 Calcaneal spur, unspecified foot: Secondary | ICD-10-CM

## 2017-07-08 NOTE — Progress Notes (Signed)
Subjective: [name] is a 52 y.o. is seen today in office s/p left foot posterior heel spur resection with Achilles tendolysis and gastrocnemius recession preformed on 04/30/2017.  She states that she is doing better.  She has been walking a regular shoe around the house.  She did wear regular shoe to work today but she has been using a rolling knee scooter intermittently.  She states that going up and down steps but still feels weak other than that she is been doing very well and she is ready to get into the pool and start water walking.  She states that she is been doing some strengthening exercises at home she is also been using a recumbent bike that she is starting this weekend for 15 minutes and she had no pain afterwards and minimal swelling.  She denies any recent injury or trauma she has no other concerns today. Denies any systemic complaints such as fevers, chills, nausea, vomiting. No calf pain, chest pain, shortness of breath.   Objective: General: No acute distress, AAOx3  DP/PT pulses palpable 2/4, CRT < 3 sec to all digits.  Protective sensation intact. Motor function intact.  Presents today wearing a regular shoe. Left foot: Incisions are healing well and the scar is well formed.  There is minimal edema there is no surrounding erythema and there is no ascending cellulitis.  There is no drainage or pus there is no clinical signs of infection.  Achilles tendon appears to be intact and Thompson test is negative.  No pain with lateral compression of the calcaneus.  No other areas of tenderness. No open lesions or pre-ulcerative lesions otherwise. There is no pain with calf compression, swelling, warmth, erythema the calf is supple.  No signs of DVT present.  Assessment and Plan:  Status post left foot surgery, doing well with no complications   -Treatment options discussed including all alternatives, risks, and complications -She is having no pain on exam today and there is  minimal edema.  We discussed a gradual increase exercise, stretching, rehab exercises.  She can start to do small amounts of water walking.  Also gradually increase nighttime that she is wearing a shoe and increase the distance that she is feeling.  She feels more comfortable we can work on steps.  Also the recumbent bike and gradually increase.  I want her to continue to wear compression stocking although she has not been wearing this to help with swelling as she increases exercise.  Again discussed physical therapy but she feels that she can do this at home on her own.  Overall she is doing well but she will gradually increase exercise and activity level.  She is very happy with this and she has no other questions or concerns.  Vivi BarrackMatthew R Wagoner DPM

## 2017-07-29 ENCOUNTER — Ambulatory Visit (INDEPENDENT_AMBULATORY_CARE_PROVIDER_SITE_OTHER): Payer: BC Managed Care – PPO | Admitting: Podiatry

## 2017-07-29 ENCOUNTER — Encounter: Payer: Self-pay | Admitting: Podiatry

## 2017-07-29 DIAGNOSIS — M7730 Calcaneal spur, unspecified foot: Secondary | ICD-10-CM

## 2017-07-29 DIAGNOSIS — M7662 Achilles tendinitis, left leg: Secondary | ICD-10-CM

## 2017-08-02 NOTE — Progress Notes (Signed)
Subjective: Kathyrn LassRebecca A Reddick is a 52 y.o. is seen today in office s/p left foot posterior heel spur resection with Achilles tendolysis and gastrocnemius recession preformed on 04/30/2017.  She states that she is doing much better.  She has returned to regular shoe.  She has been going to the gym and she has been using a recumbent bike and she is also been walking around work wearing a regular shoe.  She has not yet been doing much stopped that she is in start doing soon.  She is also going to start the elliptical hopefully.  She states that she did do quite a bit of walking one day she has some soreness afterwards however it was not pain it was just a little sore more than normal.  She has no problems wearing a regular shoe.  She denies any increase in redness or swelling.  No recent injury.  She has no other concerns. Denies any systemic complaints such as fevers, chills, nausea, vomiting. No calf pain, chest pain, shortness of breath.   Objective: General: No acute distress, AAOx3  DP/PT pulses palpable 2/4, CRT < 3 sec to all digits.  Protective sensation intact. Motor function intact.  Presents today wearing a regular shoe. Left foot: Incisions are healing well and the scar is well formed.  There is no significant tenderness to palpation along the course or insertion of the Achilles tendon or along the posterior heel on the surgical site.  Thompson test is negative the Achilles tendon is intact.  There is trace edema there is no erythema or increase in warmth.  There is no clinical signs of infection noted today. No open lesions or pre-ulcerative lesions otherwise. There is no pain with calf compression, swelling, warmth, erythema the calf is supple.  No signs of DVT present.  Assessment and Plan:  Status post left foot surgery, doing well with no complications   -Treatment options discussed including all alternatives, risks, and complications -She continues to do well.  She is wearing a regular  shoe.  Discussed gradually increasing her exercise and activity level.  We discussed she can start an elliptical without any resistance.  She can continue using a recumbent bike.  We discussed going back in the pool doing water walking as well.  She feels that she can continue to make progress on her own without going to physical therapy.  Discussed with her that if there is any increase in pain she is to back off of the activity level and call me.  She agrees this.  I will see her in the next 6 weeks for final check to see how she is doing but she is encouraged to call any questions or concerns meantime.  Vivi BarrackMatthew R Moua Rasmusson DPM

## 2017-08-06 ENCOUNTER — Encounter: Payer: Self-pay | Admitting: Podiatry

## 2017-09-09 ENCOUNTER — Other Ambulatory Visit: Payer: Self-pay | Admitting: Primary Care

## 2017-09-09 DIAGNOSIS — I10 Essential (primary) hypertension: Secondary | ICD-10-CM

## 2017-09-16 ENCOUNTER — Ambulatory Visit (INDEPENDENT_AMBULATORY_CARE_PROVIDER_SITE_OTHER): Payer: BC Managed Care – PPO | Admitting: Podiatry

## 2017-09-16 ENCOUNTER — Encounter: Payer: Self-pay | Admitting: Podiatry

## 2017-09-16 ENCOUNTER — Ambulatory Visit: Payer: BC Managed Care – PPO

## 2017-09-16 DIAGNOSIS — M7662 Achilles tendinitis, left leg: Secondary | ICD-10-CM

## 2017-09-16 DIAGNOSIS — L97401 Non-pressure chronic ulcer of unspecified heel and midfoot limited to breakdown of skin: Secondary | ICD-10-CM

## 2017-09-16 MED ORDER — MUPIROCIN 2 % EX OINT: 1.0000 "application " | TOPICAL_OINTMENT | Freq: Two times a day (BID) | CUTANEOUS | 2 refills | Status: DC

## 2017-09-16 NOTE — Progress Notes (Signed)
Subjective: [name] is a 52 y.o. is seen today in office s/p left foot posterior heel spur resection with Achilles tendolysis and gastrocnemius recession preformed on 04/30/2017.  She presents today for long-term follow-up.  She states that she was in [place] a lot of walking her last week and she feels that her shoes rubbed the back of her heel causing a small area to open up.  She denies any drainage or pus or any redness or swelling or any drainage.  She does not keep it covered.  She denies any recent injury or trauma to the area.  There is no open lesions or pre-ulcerative lesion identified at this time.  Denies any systemic complaints such as fevers, chills, nausea, vomiting. No calf pain, chest pain, shortness of breath.   Objective: General: No acute distress, AAOx3  DP/PT pulses palpable 2/4, CRT < 3 sec to all digits.  Protective sensation intact. Motor function intact.  Presents today wearing a regular shoe. Left foot: Incisions are healing well and the scar is well formed however towards the distal portion of the incision is a small superficial granular wound measuring approximately 0.3 x 0.3 cm and there is no probing, undermining or tunneling appears to be a superficial wound.  There is no surrounding erythema, ascending cellulitis.  There is no fluctuation or crepitation or malodor.  No significant tenderness palpation..  No other open lesions or pre-ulcerative lesions.  Thompson test is negative and the Achilles tendon appears to be intact. There is no pain with calf compression, swelling, warmth, erythema the calf is supple.  No signs of DVT present.  Assessment and Plan:  Status post left foot surgery, small wound from irritation from shoes  -Treatment options discussed including all alternatives, risks, and complications I cleaned the wound today.  Recommended mupirocin ointment dressing changes daily.  I will keep her covered all times.  There is no signs of  infection today but continue to monitor very closely for this.  Offloading at all times.  I do want her to continue with her rehab, stretching exercises daily as well as ice.  Continue supportive shoes.- -Monitor for any clinical signs or symptoms of infection and directed to call the office immediately should any occur or go to the ER. -RTC 2 weeks or sooner if needed.  Vivi Barrack DPM

## 2017-09-26 ENCOUNTER — Encounter: Payer: Self-pay | Admitting: Podiatry

## 2017-09-26 ENCOUNTER — Ambulatory Visit: Payer: BC Managed Care – PPO | Admitting: Primary Care

## 2017-09-26 ENCOUNTER — Encounter: Payer: Self-pay | Admitting: Primary Care

## 2017-09-26 VITALS — BP 124/82 | HR 79 | Temp 98.4°F | Ht 68.0 in | Wt 217.0 lb

## 2017-09-26 DIAGNOSIS — R7303 Prediabetes: Secondary | ICD-10-CM | POA: Diagnosis not present

## 2017-09-26 DIAGNOSIS — E669 Obesity, unspecified: Secondary | ICD-10-CM | POA: Diagnosis not present

## 2017-09-26 DIAGNOSIS — I10 Essential (primary) hypertension: Secondary | ICD-10-CM | POA: Diagnosis not present

## 2017-09-26 DIAGNOSIS — E785 Hyperlipidemia, unspecified: Secondary | ICD-10-CM | POA: Diagnosis not present

## 2017-09-26 DIAGNOSIS — E559 Vitamin D deficiency, unspecified: Secondary | ICD-10-CM | POA: Diagnosis not present

## 2017-09-26 DIAGNOSIS — E66811 Obesity, class 1: Secondary | ICD-10-CM

## 2017-09-26 LAB — LIPID PANEL
CHOLESTEROL: 177 mg/dL (ref 0–200)
HDL: 49 mg/dL (ref >39.00)
LDL CALC: 110 mg/dL — AB (ref 0–99)
NonHDL: 127.85
TRIGLYCERIDES: 88 mg/dL (ref 0.0–149.0)
Total CHOL/HDL Ratio: 4
VLDL: 17.6 mg/dL (ref 0.0–40.0)

## 2017-09-26 LAB — COMPREHENSIVE METABOLIC PANEL
ALT: 24 U/L (ref 0–35)
AST: 23 U/L (ref 0–37)
Albumin: 4 g/dL (ref 3.5–5.2)
Alkaline Phosphatase: 71 U/L (ref 39–117)
BILIRUBIN TOTAL: 0.4 mg/dL (ref 0.2–1.2)
BUN: 15 mg/dL (ref 6–23)
CO2: 28 mEq/L (ref 19–32)
CREATININE: 0.65 mg/dL (ref 0.40–1.20)
Calcium: 9.1 mg/dL (ref 8.4–10.5)
Chloride: 105 mEq/L (ref 96–112)
GFR: 101.34 mL/min (ref >60.00)
Glucose, Bld: 101 mg/dL — ABNORMAL HIGH (ref 70–99)
Potassium: 4.1 mEq/L (ref 3.5–5.1)
SODIUM: 141 meq/L (ref 135–145)
Total Protein: 7.1 g/dL (ref 6.0–8.3)

## 2017-09-26 LAB — HEMOGLOBIN A1C: Hgb A1c MFr Bld: 5.9 % (ref 4.6–6.5)

## 2017-09-26 LAB — VITAMIN D 25 HYDROXY (VIT D DEFICIENCY, FRACTURES): VITD: 33.91 ng/mL (ref 30.00–100.00)

## 2017-09-26 MED ORDER — HYDROCHLOROTHIAZIDE 25 MG PO TABS: ORAL_TABLET | ORAL | 3 refills | Status: DC

## 2017-09-26 MED ORDER — LISINOPRIL 10 MG PO TABS: ORAL_TABLET | ORAL | 3 refills | Status: DC

## 2017-09-26 NOTE — Assessment & Plan Note (Addendum)
Stable in the office today, continue lisinopril and HCTZ.  BMP pending.  Epworth Sleepiness scale score of 5 today. Snoring could be secondary to weight gain from inactivity, allergies, sleep apnea. She will monitor symptoms and report continued symptoms after she attempts weight loss.

## 2017-09-26 NOTE — Assessment & Plan Note (Signed)
Repeat lipids pending today. Discussed the importance of a healthy diet and regular exercise in order for weight loss, and to reduce the risk of any potential medical problems.  

## 2017-09-26 NOTE — Assessment & Plan Note (Signed)
Repeat A1C pending today. Encouraged regular exercise.

## 2017-09-26 NOTE — Patient Instructions (Signed)
Stop by the lab prior to leaving today. I will notify you of your results once received.   I sent refills of your blood pressure medications to the pharmacy.  Start exercising. You should be getting 150 minutes of moderate intensity exercise weekly. Work up to this gradually.  It was a pleasure to see you today!

## 2017-09-26 NOTE — Assessment & Plan Note (Signed)
She plans on starting regular exercise as she is now finished with surgeries. Continue to work on diet.

## 2017-09-26 NOTE — Progress Notes (Signed)
Subjective:    Patient ID: [name], female    DOB: [birth year], 52 y.o.   MRN: [id number]  HPI  [name] is a 52 year old female who presents today for medication refill.  1) Essential Hypertension: Currently managed on HCTZ 25 mg and Lisinopril 10 mg. She denies chest pain, dizziness, shortness of breath. Her husband has noticed that she's started snoring within the last few months. She denies daytime fatigue/drowsiness, waking up during the night, family history of sleep apnea. She does endorse weight gain as she hasn't been able to exercise.   BP Readings from Last 3 Encounters:  09/26/17 124/82  12/23/16 126/80  08/21/16 130/84   2) Vitamin D Deficiency: Currently managed on 1000 units over the counter. She is due for repeat vitamin D check.   3) Prediabetes: Last A1C of 5.9 one year ago. She has not been exercising due to multiple foot surgeries. She endorses a healthy diet, eats gluten free.   Wt Readings from Last 3 Encounters:  09/26/17 217 lb (98.4 kg)  08/21/16 205 lb (93 kg)  01/09/16 212 lb 12.8 oz (96.5 kg)     Review of Systems  Constitutional: Negative for fatigue.  Eyes: Negative for visual disturbance.  Respiratory: Negative for shortness of breath.        Snoring  Cardiovascular: Negative for chest pain.  Neurological: Negative for dizziness and headaches.       Past Medical History:  Diagnosis Date  . Anemia   . Depression   . H/O scarlet fever   . Heart murmur   . History of chicken pox   . History of genital warts   . Hypertension   . Plantar fasciitis   . Rash due to allergy    Wheat allergy.  . Shingles      Social History   Socioeconomic History  . Marital status: Married    Spouse name: Not on file  . Number of children: 2  . Years of education: 59  . Highest education level: Not on file  Occupational History  . Occupation: Psychologist, forensic  Social Needs  . Financial resource strain: Not on file  . Food  insecurity:    Worry: Not on file    Inability: Not on file  . Transportation needs:    Medical: Not on file    Non-medical: Not on file  Tobacco Use  . Smoking status: Never Smoker  . Smokeless tobacco: Never Used  Substance and Sexual Activity  . Alcohol use: Yes    Alcohol/week: 3.6 oz    Types: 6 Glasses of wine per week  . Drug use: No  . Sexual activity: Not on file  Lifestyle  . Physical activity:    Days per week: Not on file    Minutes per session: Not on file  . Stress: Not on file  Relationships  . Social connections:    Talks on phone: Not on file    Gets together: Not on file    Attends religious service: Not on file    Active member of club or organization: Not on file    Attends meetings of clubs or organizations: Not on file    Relationship status: Not on file  . Intimate partner violence:    Fear of current or ex partner: Not on file    Emotionally abused: Not on file    Physically abused: Not on file    Forced sexual activity: Not on file  Other Topics Concern  . Not on file  Social History Narrative   Married.   2 children.   She teaches high school.   Enjoys Clinical cytogeneticist.     Past Surgical History:  Procedure Laterality Date  . ABDOMINAL HYSTERECTOMY  2005  . KNEE SURGERY Left    2015  . VAGINAL DELIVERY     x2    Family History  Adopted: Yes  Problem Relation Age of Onset  . Hyperlipidemia Mother   . Hypertension Mother   . Colon polyps Mother   . Stroke Maternal Grandmother   . Colon cancer Neg Hx   . Esophageal cancer Neg Hx   . Rectal cancer Neg Hx   . Stomach cancer Neg Hx     Allergies  Allergen Reactions  . Wheat Bran Rash    Can't eat wheat or have wheat as an ingredient    Current Outpatient Medications on File Prior to Visit  Medication Sig Dispense Refill  . aspirin 81 MG tablet Take 81 mg by mouth daily.    Marland Kitchen ibuprofen (ADVIL,MOTRIN) 600 MG tablet Take 1 tablet (600 mg total) by mouth every 8 (eight) hours as needed.  30 tablet 2   No current facility-administered medications on file prior to visit.     BP 124/82   Pulse 79   Temp 98.4 F (36.9 C) (Oral)   Ht  (1.727 m)   Wt 217 lb (98.4 kg)   SpO2 98%   BMI 32.99 kg/m    Objective:   Physical Exam  Constitutional: She appears well-nourished.  Neck: Neck supple.  Cardiovascular: Normal rate and regular rhythm.  Pulmonary/Chest: Effort normal and breath sounds normal.  Skin: Skin is warm and dry.  Psychiatric: She has a normal mood and affect.          Assessment & Plan:

## 2017-09-30 ENCOUNTER — Encounter: Payer: BC Managed Care – PPO | Admitting: Podiatry

## 2017-11-23 ENCOUNTER — Encounter: Payer: Self-pay | Admitting: Primary Care

## 2017-11-23 ENCOUNTER — Telehealth: Payer: BC Managed Care – PPO | Admitting: Family

## 2017-11-23 DIAGNOSIS — L719 Rosacea, unspecified: Secondary | ICD-10-CM

## 2017-11-23 MED ORDER — METRONIDAZOLE 1 % EX CREA: TOPICAL_CREAM | Freq: Every day | CUTANEOUS | 0 refills | Status: DC

## 2017-11-23 MED ORDER — DOXYCYCLINE HYCLATE 100 MG PO TABS: 100.0000 mg | ORAL_TABLET | Freq: Two times a day (BID) | ORAL | 0 refills | Status: DC

## 2017-11-23 NOTE — Progress Notes (Signed)
E visit for Rosacea We are sorry that you are not feeling well. Here is how we plan to help! Based on what you shared with me it looks like you have Rosacea.  Rosacea is a common chronic skin condition that usually only affects the face and eyes.  Occasionally, the neck, chest, or other areas may be involved.  Characterized by redness, pimples, and broken blood vessels, rosacea tends to begin after middle age (between the ages of 30 and 60).  It is more common in fair-skinned people and women in menopause. It may appear differently in dark skinned people but rosacea effects all ethnic groups.  The cause of rosacea is not fully understood. We do know that rosacea is worsened by various trigger factors including, spicy or hot foods, hot beverages such as coffee or tea, alcohol, and sun exposure just to name a few.  Signs of rosacea may vary greatly from person to person. In some individuals it may only flare up from time to time.   I have prescribed: A topical antibiotic called Metronidazole 1%.  Apply a thin film to affected area once daily.  Wash your hands before and after use. Make sure your skin is clean and dry.  Rub in gently and completely over the affected areas.   HOME CARE: . Keep a record of triggers, such as stress, weather, or certain foods or drinks. Consider limiting hot or spicy foods and alcohol. . Always use sunscreen that protects against UVA and UVB rays and has a sun-protecting factor (SPF) of 15 or higher. . Avoid putting steroids on the skin sores. Steroids may make rosacea worse.  . You may use small amounts of water based cosmetic while using this medication.  Apply cosmetics after cream has dried. . If you shave your face, use an electric razor . Don't scrub your skin or use sponges, brushes, or other abrasive tools. Doing so can irritate your skin. GET HELP RIGHT AWAY IF: . If your rosacea gets worse or is not better within 4 weeks. . If a new skin condition or rash  develops. . Loss of feeling or tingling of treated area . Nausea  MAKE SURE YOU    Understand these instructions.  Will watch your condition.  Will get help right away if you are not doing well or get worse.  Your e-visit answers were reviewed by a board certified advanced clinical practitioner to complete your personal care plan. Depending upon the condition, your plan could have included both over the counter or prescription medications. Please review your pharmacy choice. Be sure that the pharmacy you have chosen is open so that you can pick up your prescription now.  If there is a problem you may message your provider in MyChart to have the prescription routed to another pharmacy. Your safety is important to us. If you have drug allergies check your prescription carefully.  For the next 24 hours, you can use MyChart to ask questions about today's visit, request a non-urgent call back, or ask for a work or school excuse from your e-visit provider. You will get an email in the next two days asking about your experience. I hope that your e-visit has been valuable and will speed your recovery.    

## 2017-11-23 NOTE — Addendum Note (Signed)
Addended by: Jannifer RodneyHAWKS, Krikor Willet A on: 11/23/2017 11:31 AM   Modules accepted: Orders

## 2018-02-03 ENCOUNTER — Other Ambulatory Visit: Payer: Self-pay | Admitting: Primary Care

## 2018-02-03 DIAGNOSIS — Z1231 Encounter for screening mammogram for malignant neoplasm of breast: Secondary | ICD-10-CM

## 2018-02-16 ENCOUNTER — Ambulatory Visit
Admission: RE | Admit: 2018-02-16 | Discharge: 2018-02-16 | Disposition: A | Discharge status: Home / Self Care | Payer: BC Managed Care – PPO | Source: Ambulatory Visit | Attending: Primary Care | Admitting: Primary Care

## 2018-02-16 DIAGNOSIS — Z1231 Encounter for screening mammogram for malignant neoplasm of breast: Secondary | ICD-10-CM | POA: Insufficient documentation

## 2018-08-03 ENCOUNTER — Encounter: Payer: BC Managed Care – PPO | Admitting: Primary Care

## 2018-10-27 ENCOUNTER — Other Ambulatory Visit: Payer: Self-pay | Admitting: Primary Care

## 2018-10-27 DIAGNOSIS — I10 Essential (primary) hypertension: Secondary | ICD-10-CM

## 2018-11-19 ENCOUNTER — Other Ambulatory Visit: Payer: Self-pay | Admitting: Primary Care

## 2018-11-19 ENCOUNTER — Telehealth: Payer: Self-pay

## 2018-11-19 DIAGNOSIS — R7303 Prediabetes: Secondary | ICD-10-CM

## 2018-11-19 DIAGNOSIS — I10 Essential (primary) hypertension: Secondary | ICD-10-CM

## 2018-11-19 DIAGNOSIS — Z0000 Encounter for general adult medical examination without abnormal findings: Secondary | ICD-10-CM

## 2018-11-19 DIAGNOSIS — D509 Iron deficiency anemia, unspecified: Secondary | ICD-10-CM

## 2018-11-19 DIAGNOSIS — E785 Hyperlipidemia, unspecified: Secondary | ICD-10-CM

## 2018-11-19 NOTE — Telephone Encounter (Signed)
Left message on vm asking pt to call back.  Need to do COVID scrn and give backdoor directions for 8:10 lab visit.

## 2018-11-23 ENCOUNTER — Other Ambulatory Visit: Payer: Self-pay

## 2018-11-23 ENCOUNTER — Other Ambulatory Visit (INDEPENDENT_AMBULATORY_CARE_PROVIDER_SITE_OTHER): Payer: BC Managed Care – PPO

## 2018-11-23 DIAGNOSIS — E785 Hyperlipidemia, unspecified: Secondary | ICD-10-CM | POA: Diagnosis not present

## 2018-11-23 DIAGNOSIS — R7303 Prediabetes: Secondary | ICD-10-CM | POA: Diagnosis not present

## 2018-11-23 DIAGNOSIS — I10 Essential (primary) hypertension: Secondary | ICD-10-CM | POA: Diagnosis not present

## 2018-11-23 DIAGNOSIS — Z0000 Encounter for general adult medical examination without abnormal findings: Secondary | ICD-10-CM

## 2018-11-23 DIAGNOSIS — D509 Iron deficiency anemia, unspecified: Secondary | ICD-10-CM

## 2018-11-23 LAB — COMPREHENSIVE METABOLIC PANEL
ALT: 25 U/L (ref 0–35)
AST: 22 U/L (ref 0–37)
Albumin: 3.9 g/dL (ref 3.5–5.2)
Alkaline Phosphatase: 92 U/L (ref 39–117)
BUN: 10 mg/dL (ref 6–23)
CO2: 26 mEq/L (ref 19–32)
Calcium: 8.7 mg/dL (ref 8.4–10.5)
Chloride: 106 mEq/L (ref 96–112)
Creatinine, Ser: 0.71 mg/dL (ref 0.40–1.20)
GFR: 85.74 mL/min (ref >60.00)
Glucose, Bld: 98 mg/dL (ref 70–99)
Potassium: 4.3 mEq/L (ref 3.5–5.1)
Sodium: 140 mEq/L (ref 135–145)
Total Bilirubin: 0.3 mg/dL (ref 0.2–1.2)
Total Protein: 6.6 g/dL (ref 6.0–8.3)

## 2018-11-23 LAB — CBC
HCT: 34.8 % — ABNORMAL LOW (ref 36.0–46.0)
Hemoglobin: 11.2 g/dL — ABNORMAL LOW (ref 12.0–15.0)
MCHC: 32.1 g/dL (ref 30.0–36.0)
MCV: 83.4 fl (ref 78.0–100.0)
Platelets: 329 10*3/uL (ref 150.0–400.0)
RBC: 4.18 Mil/uL (ref 3.87–5.11)
RDW: 14.5 % (ref 11.5–15.5)
WBC: 7 10*3/uL (ref 4.0–10.5)

## 2018-11-23 LAB — LIPID PANEL
Cholesterol: 188 mg/dL (ref 0–200)
HDL: 41.1 mg/dL (ref >39.00)
LDL Cholesterol: 118 mg/dL — ABNORMAL HIGH (ref 0–99)
NonHDL: 146.54
Total CHOL/HDL Ratio: 5
Triglycerides: 145 mg/dL (ref 0.0–149.0)
VLDL: 29 mg/dL (ref 0.0–40.0)

## 2018-11-23 LAB — VITAMIN D 25 HYDROXY (VIT D DEFICIENCY, FRACTURES): VITD: 33.33 ng/mL (ref 30.00–100.00)

## 2018-11-23 LAB — HEMOGLOBIN A1C: Hgb A1c MFr Bld: 5.9 % (ref 4.6–6.5)

## 2018-11-25 ENCOUNTER — Ambulatory Visit (INDEPENDENT_AMBULATORY_CARE_PROVIDER_SITE_OTHER): Payer: BC Managed Care – PPO | Admitting: Family Medicine

## 2018-11-25 ENCOUNTER — Encounter: Payer: Self-pay | Admitting: Family Medicine

## 2018-11-25 VITALS — Temp 99.1°F

## 2018-11-25 DIAGNOSIS — R0981 Nasal congestion: Secondary | ICD-10-CM

## 2018-11-25 DIAGNOSIS — R509 Fever, unspecified: Secondary | ICD-10-CM | POA: Diagnosis not present

## 2018-11-25 DIAGNOSIS — J029 Acute pharyngitis, unspecified: Secondary | ICD-10-CM

## 2018-11-25 DIAGNOSIS — Z7189 Other specified counseling: Secondary | ICD-10-CM | POA: Diagnosis not present

## 2018-11-25 DIAGNOSIS — Z20828 Contact with and (suspected) exposure to other viral communicable diseases: Secondary | ICD-10-CM

## 2018-11-25 NOTE — Progress Notes (Signed)
Virtual Visit via Video Note  I connected with [name] on 11/25/18 at 10:05 AM EDT by a video enabled telemedicine application and verified that I am speaking with the correct person using two identifiers.  Location: Patient: In her home Provider: [facility] [place]   I discussed the limitations of evaluation and management by telemedicine and the availability of in person appointments. The patient expressed understanding and agreed to proceed.  History of Present Illness: This is a 53 year old female who requests virtual visit today to talk about several symptoms.  She has had several weeks of nasal congestion worse on the right side.  She has been using a Nettie pot and diphenhydramine at bedtime with some relief.  Yesterday she developed a sore throat and it was worse today.  Her temperature is 99.1.  She denies cough, ear pain, headache, wheeze, shortness of breath.  She is concerned for possible COVID-19 infection.  She has been pretty good about staying home and masking and maintaining social distancing when she leaves the house.  Her daughter who lives in the home was in [place] and recently returned.  She has been working in another part of the house may have been social distancing.   Past Medical History:  Diagnosis Date  . Anemia   . Depression   . H/O scarlet fever   . Heart murmur   . History of chicken pox   . History of genital warts   . Hypertension   . Plantar fasciitis   . Rash due to allergy    Wheat allergy.  . Shingles    Past Surgical History:  Procedure Laterality Date  . ABDOMINAL HYSTERECTOMY  2005  . KNEE SURGERY Left    2015  . VAGINAL DELIVERY     x2   Family History  Adopted: Yes  Problem Relation Age of Onset  . Hyperlipidemia Mother   . Hypertension Mother   . Colon polyps Mother   . Stroke Maternal Grandmother   . Colon cancer Neg Hx   . Esophageal cancer Neg Hx   . Rectal cancer Neg Hx   . Stomach cancer Neg Hx    Social  History   Tobacco Use  . Smoking status: Never Smoker  . Smokeless tobacco: Never Used  Substance Use Topics  . Alcohol use: Yes    Alcohol/week: 6.0 standard drinks    Types: 6 Glasses of wine per week  . Drug use: No    Observations/Objective: The patient is alert and answers questions appropriately.  Visible skin is unremarkable.  She is normally conversive without shortness of breath, audible wheeze or cough.  Mood and affect are appropriate. Temp 99.1 F (37.3 C)  Wt Readings from Last 3 Encounters:  09/26/17 217 lb (98.4 kg)  08/21/16 205 lb (93 kg)  01/09/16 212 lb 12.8 oz (96.5 kg)   BP Readings from Last 3 Encounters:  09/26/17 124/82  12/23/16 126/80  08/21/16 130/84    Assessment and Plan: 1. Low grade fever -Likely viral illness, and possible need to rule out coronavirus without testing. -Message sent to community testing pool to request testing for patient -Symptomatic treatment discussed - ER precautions reviewed  2. Sore throat -Per #1  3. Nasal congestion -Continue Nettie pot use twice daily, can add long-acting antihistamine if desired  4. Advice Given About Covid-19 Virus Infection -Patient with possible symptoms but at low risk.  She is requesting testing.  Message sent to community testing pool.  [name], FNP-BC  [facility], [facility]  11/25/2018 10:26 AM   Follow Up Instructions:    I discussed the assessment and treatment plan with the patient. The patient was provided an opportunity to ask questions and all were answered. The patient agreed with the plan and demonstrated an understanding of the instructions.   The patient was advised to call back or seek an in-person evaluation if the symptoms worsen or if the condition fails to improve as anticipated.   [name], FNP

## 2018-11-25 NOTE — Addendum Note (Signed)
Addended by: [name] B on: 11/25/2018 10:37 AM   Modules accepted: Orders

## 2018-11-26 ENCOUNTER — Telehealth: Payer: Self-pay | Admitting: *Deleted

## 2018-11-26 ENCOUNTER — Other Ambulatory Visit: Payer: BC Managed Care – PPO

## 2018-11-26 DIAGNOSIS — Z20822 Contact with and (suspected) exposure to covid-19: Secondary | ICD-10-CM

## 2018-11-26 NOTE — Telephone Encounter (Signed)
Scheduled patient for COVID 19 test today at 10:45 am at [facility].  Testing protocol reviewed.

## 2018-11-26 NOTE — Telephone Encounter (Signed)
-----   Message from [name], [place] sent at 11/25/2018 10:20 AM EDT ----- Good morning,   I would like to request testing for the above patient.  She has had 2 days of sore throat, low-grade fever.  Thank you,  Tor Netters, FNP-BC

## 2018-11-27 ENCOUNTER — Ambulatory Visit (INDEPENDENT_AMBULATORY_CARE_PROVIDER_SITE_OTHER): Payer: BC Managed Care – PPO | Admitting: Primary Care

## 2018-11-27 ENCOUNTER — Encounter: Payer: Self-pay | Admitting: Primary Care

## 2018-11-27 VITALS — BP 125/83 | Temp 98.6°F | Ht 68.0 in | Wt 221.0 lb

## 2018-11-27 DIAGNOSIS — J329 Chronic sinusitis, unspecified: Secondary | ICD-10-CM | POA: Insufficient documentation

## 2018-11-27 DIAGNOSIS — E785 Hyperlipidemia, unspecified: Secondary | ICD-10-CM

## 2018-11-27 DIAGNOSIS — I10 Essential (primary) hypertension: Secondary | ICD-10-CM | POA: Diagnosis not present

## 2018-11-27 DIAGNOSIS — Z0000 Encounter for general adult medical examination without abnormal findings: Secondary | ICD-10-CM | POA: Diagnosis not present

## 2018-11-27 DIAGNOSIS — R7303 Prediabetes: Secondary | ICD-10-CM

## 2018-11-27 DIAGNOSIS — J019 Acute sinusitis, unspecified: Secondary | ICD-10-CM

## 2018-11-27 MED ORDER — LISINOPRIL 10 MG PO TABS: ORAL_TABLET | ORAL | 3 refills | Status: DC

## 2018-11-27 MED ORDER — HYDROCHLOROTHIAZIDE 25 MG PO TABS: ORAL_TABLET | ORAL | 3 refills | Status: DC

## 2018-11-27 MED ORDER — AMOXICILLIN-POT CLAVULANATE 875-125 MG PO TABS: 1.0000 | ORAL_TABLET | Freq: Two times a day (BID) | ORAL | 0 refills | Status: DC

## 2018-11-27 NOTE — Assessment & Plan Note (Signed)
Slight increase in LDL when compared to last year.  This is likely due to less overall activity given COVID. Discussed to increase activity, continue to work on diet.  Continue to monitor.

## 2018-11-27 NOTE — Assessment & Plan Note (Signed)
Symptoms for 2 weeks, progressing. Little improvement with over-the-counter treatment. Given history and duration of symptoms we will treat with antibiotics.  Prescription for Augmentin course sent to pharmacy. She will update.

## 2018-11-27 NOTE — Patient Instructions (Signed)
Start Augmentin antibiotics for the infection Take 1 tablet by mouth twice daily for 10 days.  Continue exercising. You should be getting 150 minutes of moderate intensity exercise weekly.  Continue to work on a healthy diet. Ensure you are consuming 64 ounces of water daily.  I sent refills of your medication to your pharmacy.  It was a pleasure to see you today! Allie Bossier, NP-C

## 2018-11-27 NOTE — Assessment & Plan Note (Signed)
Stable with recent home check. Recent BMP unremarkable. Continue lisinopril and hydrochlorothiazide. Refill sent to pharmacy.

## 2018-11-27 NOTE — Assessment & Plan Note (Signed)
Immunizations up-to-date. Mammogram up-to-date, due in October 2020. Colonoscopy up-to-date, due in 2027. Encouraged a healthy diet and regular exercise. Virtual exam unremarkable. Labs reviewed.

## 2018-11-27 NOTE — Progress Notes (Signed)
Subjective:    Patient ID: [name], female    DOB: 17-Nov-1965, 53 y.o.   MRN: [id number]  HPI  Virtual Visit via Video Note  I connected with [name] on 11/27/18 at  2:20 PM EDT by a video enabled telemedicine application and verified that I am speaking with the correct person using two identifiers.  Location: Patient: Home Provider: Office   I discussed the limitations of evaluation and management by telemedicine and the availability of in person appointments. The patient expressed understanding and agreed to proceed.  History of Present Illness:  [name] is a 53 year old female who presents today for complete physical.  She also endorses right sided nasal congestion and right maxillary sinus pressure. She has a history of recurrent sinusitis, no recent infections. She's been using neti pot rinses and nasal sprays with occasional improvement. She's draining thick yellow discharge from her nasal cavity. Symptoms began 2 weeks ago and are progressing. She has noticed a low grade fever of 99.4 and did seek out Covid testing which is pending. She has been at home with very minimal public encounters for the last several weeks.   Immunizations: -Tetanus: Completed in 2014 -Influenza: Due this season   Diet: She is eating a healthy diet. Limits carbs, mostly eats vegetables, lean protein, tofu. She is mostly drinking water. Some juice. Rarely eats desserts.  Exercise: She is walking daily, 2 miles.   Eye exam: Completed in February 2020 Dental exam: Completes semi-annually  Colonoscopy: Completed in 2017, due in 2027 Pap Smear: Hysterectomy  Mammogram: Completed in October 2019  BP Readings from Last 3 Encounters:  11/27/18 125/83  09/26/17 124/82  12/23/16 126/80      Observations/Objective:  Alert and oriented. Appears well, not sickly. No distress. Speaking in complete sentences.   Assessment and Plan:  See problem based charting.   Follow  Up Instructions:  Start Augmentin antibiotics for the infection Take 1 tablet by mouth twice daily for 10 days.  Continue exercising. You should be getting 150 minutes of moderate intensity exercise weekly.  Continue to work on a healthy diet. Ensure you are consuming 64 ounces of water daily.  I sent refills of your medication to your pharmacy.  It was a pleasure to see you today! Erica Bossier, NP-C    I discussed the assessment and treatment plan with the patient. The patient was provided an opportunity to ask questions and all were answered. The patient agreed with the plan and demonstrated an understanding of the instructions.   The patient was advised to call back or seek an in-person evaluation if the symptoms worsen or if the condition fails to improve as anticipated.     Erica Koch, NP    Review of Systems  Constitutional: Positive for fever. Negative for unexpected weight change.  HENT: Positive for congestion and sinus pressure. Negative for rhinorrhea.   Respiratory: Negative for cough and shortness of breath.   Cardiovascular: Negative for chest pain.  Gastrointestinal: Negative for constipation and diarrhea.  Genitourinary: Negative for difficulty urinating.  Musculoskeletal: Negative for arthralgias and myalgias.  Skin: Negative for rash.  Allergic/Immunologic: Positive for environmental allergies.  Neurological: Negative for dizziness, numbness and headaches.  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       Past Medical History:  Diagnosis Date  . Anemia   . Depression   .  H/O scarlet fever   . Heart murmur   . History of chicken pox   . History of genital warts   . Hypertension   . Plantar fasciitis   . Rash due to allergy    Wheat allergy.  . Shingles      Social History   Socioeconomic History  . Marital status: Married    Spouse name: Not on file  . Number of  children: 2  . Years of education: 3818  . Highest education level: Not on file  Occupational History  . Occupation: Psychologist, forensicHigh School Teacher  Social Needs  . Financial resource strain: Not on file  . Food insecurity    Worry: Not on file    Inability: Not on file  . Transportation needs    Medical: Not on file    Non-medical: Not on file  Tobacco Use  . Smoking status: Never Smoker  . Smokeless tobacco: Never Used  Substance and Sexual Activity  . Alcohol use: Yes    Alcohol/week: 6.0 standard drinks    Types: 6 Glasses of wine per week  . Drug use: No  . Sexual activity: Not on file  Lifestyle  . Physical activity    Days per week: Not on file    Minutes per session: Not on file  . Stress: Not on file  Relationships  . Social Musicianconnections    Talks on phone: Not on file    Gets together: Not on file    Attends religious service: Not on file    Active member of club or organization: Not on file    Attends meetings of clubs or organizations: Not on file    Relationship status: Not on file  . Intimate partner violence    Fear of current or ex partner: Not on file    Emotionally abused: Not on file    Physically abused: Not on file    Forced sexual activity: Not on file  Other Topics Concern  . Not on file  Social History Narrative   Married.   2 children.   She teaches high school.   Enjoys Clinical cytogeneticistcrafting.     Past Surgical History:  Procedure Laterality Date  . ABDOMINAL HYSTERECTOMY  2005  . KNEE SURGERY Left    2015  . VAGINAL DELIVERY     x2    Family History  Adopted: Yes  Problem Relation Age of Onset  . Hyperlipidemia Mother   . Hypertension Mother   . Colon polyps Mother   . Stroke Maternal Grandmother   . Colon cancer Neg Hx   . Esophageal cancer Neg Hx   . Rectal cancer Neg Hx   . Stomach cancer Neg Hx     Allergies  Allergen Reactions  . Wheat Bran Rash    Can't eat wheat or have wheat as an ingredient    Current Outpatient Medications on File  Prior to Visit  Medication Sig Dispense Refill  . aspirin 81 MG tablet Take 81 mg by mouth daily.    . hydrochlorothiazide (HYDRODIURIL) 25 MG tablet TAKE 1 TABLET(25 MG) BY MOUTH DAILY for blood pressure. NEED APPOINTMENT FOR ANY MORE REFILLS 30 tablet 0  . lisinopril (ZESTRIL) 10 MG tablet TAKE 1 TABLET BY MOUTH EVERY DAY FOR BLOOD PRESSURE NEED APPOINTMENT FOR ANY MORE REFILLS 30 tablet 0   No current facility-administered medications on file prior to visit.     BP 125/83   Temp 98.6 F (37 C) (Oral)  Ht 5\' 8"  (1.727 m)   Wt 221 lb (100.2 kg)   BMI 33.60 kg/m    Objective:   Physical Exam  Constitutional: She is oriented to person, place, and time. She appears well-nourished.  HENT:  Mouth/Throat: No oropharyngeal exudate.  Eyes: EOM are normal.  Neck: Neck supple.  Cardiovascular: Normal rate and regular rhythm.  Respiratory: Effort normal and breath sounds normal.  Musculoskeletal: Normal range of motion.  Neurological: She is alert and oriented to person, place, and time.  Skin: Skin is warm and dry.  Psychiatric: She has a normal mood and affect.           Assessment & Plan:

## 2018-11-27 NOTE — Assessment & Plan Note (Signed)
Recent A1c stable when compared to last year.  Unchanged.  Discussed to increase activity levels.

## 2018-12-01 LAB — NOVEL CORONAVIRUS, NAA: SARS-CoV-2, NAA: NOT DETECTED

## 2018-12-08 ENCOUNTER — Other Ambulatory Visit: Payer: Self-pay | Admitting: Primary Care

## 2018-12-10 ENCOUNTER — Telehealth: Payer: Self-pay

## 2018-12-10 NOTE — Telephone Encounter (Signed)
Spoke with patient regarding her concerns over lack of signiage and inability to get through on phones as well as her concerns with staffs response when she expressed concern.   I apologized that her service was not excellent and explained to her how we were addressing these breakdowns, including new signage and support.   Patient verbalizes appreciation for the call and has no further concerns at this time.

## 2018-12-10 NOTE — Telephone Encounter (Signed)
General message left for patient on her answering machine requesting a call back at her convenience.  Would like to discuss some recent concerns she expressed from her last visit.

## 2019-01-14 ENCOUNTER — Other Ambulatory Visit: Payer: Self-pay | Admitting: Primary Care

## 2019-01-14 DIAGNOSIS — Z1231 Encounter for screening mammogram for malignant neoplasm of breast: Secondary | ICD-10-CM

## 2019-02-18 ENCOUNTER — Ambulatory Visit
Admission: RE | Admit: 2019-02-18 | Discharge: 2019-02-18 | Disposition: A | Discharge status: Home / Self Care | Payer: BC Managed Care – PPO | Source: Ambulatory Visit | Attending: Primary Care | Admitting: Primary Care

## 2019-02-18 DIAGNOSIS — Z1231 Encounter for screening mammogram for malignant neoplasm of breast: Secondary | ICD-10-CM | POA: Diagnosis not present

## 2019-03-03 NOTE — Telephone Encounter (Signed)
Spoken to patient and conform that HR received it.

## 2019-03-03 NOTE — Telephone Encounter (Signed)
Patient returned your call. She stated that this problem below may be resolved but would still like a call back

## 2019-03-03 NOTE — Telephone Encounter (Signed)
Message left for patient to return my call.  

## 2019-03-03 NOTE — Telephone Encounter (Signed)
[name], did you get this form that I placed in your inbox last week?

## 2019-03-09 ENCOUNTER — Ambulatory Visit (INDEPENDENT_AMBULATORY_CARE_PROVIDER_SITE_OTHER): Payer: BC Managed Care – PPO

## 2019-03-09 DIAGNOSIS — Z23 Encounter for immunization: Secondary | ICD-10-CM

## 2019-05-28 ENCOUNTER — Ambulatory Visit (INDEPENDENT_AMBULATORY_CARE_PROVIDER_SITE_OTHER)
Admission: RE | Admit: 2019-05-28 | Discharge: 2019-05-28 | Disposition: A | Discharge status: Home / Self Care | Payer: BC Managed Care – PPO | Source: Ambulatory Visit

## 2019-05-28 DIAGNOSIS — B029 Zoster without complications: Secondary | ICD-10-CM

## 2019-05-28 MED ORDER — VALACYCLOVIR HCL 1 G PO TABS: 1000.0000 mg | ORAL_TABLET | Freq: Three times a day (TID) | ORAL | 0 refills | Status: DC

## 2019-05-28 NOTE — Discharge Instructions (Addendum)
Take the valacyclovir as directed.    Keep the area clean and dry.  Wash it gently twice a day with soap and water.    Come here to be seen in person or follow-up with your primary care provider if you see signs of infection, such as increased pain, redness, pus-like drainage, warmth, fever, chills, or other concerning symptoms.

## 2019-05-28 NOTE — ED Provider Notes (Signed)
Virtual Visit via Video Note:  NALAH MACIOCE  initiated request for Telemedicine visit with [facility] team. I connected with Kathyrn Lass  on 05/28/2019 at 4:46 PM  for a synchronized telemedicine visit using a video enabled HIPPA compliant telemedicine application. I verified that I am speaking with Kathyrn Lass  using two identifiers. Mickie Bail, NP  was physically located in a [facility] Urgent care site and MARIGRACE MCCOLE was located at a different location.   The limitations of evaluation and management by telemedicine as well as the availability of in-person appointments were discussed. Patient was informed that she  may incur a bill ( including co-pay) for this virtual visit encounter. [name]  expressed understanding and gave verbal consent to proceed with virtual visit.     History of Present Illness:[name]  is a 54 y.o. female presents for evaluation of "hot, painful rash on my face" since this morning.  As the day has progressed, more vesicles have come up; she now has a cluster of vesicles on her right facial cheek.  She reports this is similar to her previous shingles rash 4-5 years ago in the same location.  Denies fever, chills, ear pain, sore throat, cough, shortness of breath, or other symptoms.  No injury to the area; no drainage.  Treatment at home with bandage to area.     Allergies  Allergen Reactions  . Wheat Bran Rash    Can't eat wheat or have wheat as an ingredient     Past Medical History:  Diagnosis Date  . Anemia   . Depression   . H/O scarlet fever   . Heart murmur   . History of chicken pox   . History of genital warts   . Hypertension   . Plantar fasciitis   . Rash due to allergy    Wheat allergy.  . Shingles      Social History   Tobacco Use  . Smoking status: Never Smoker  . Smokeless tobacco: Never Used  Substance Use Topics  . Alcohol use: Yes    Alcohol/week: 6.0 standard drinks    Types:  6 Glasses of wine per week  . Drug use: No        Observations/Objective: Physical Exam  VITALS: Patient denies fever. GENERAL: Alert, appears well and in no acute distress. HEENT: Atraumatic. NECK: Normal movements of the head and neck. CARDIOPULMONARY: No increased WOB. Speaking in clear sentences. I:E ratio WNL.  MS: Moves all visible extremities without noticeable abnormality. PSYCH: Pleasant and cooperative, well-groomed. Speech normal rate and rhythm. Affect is appropriate. Insight and judgement are appropriate. Attention is focused, linear, and appropriate.  NEURO: CN grossly intact. Oriented as arrived to appointment on time with no prompting. Moves both UE equally.  SKIN: Erythematous cluster of vesicles on right cheek.     Assessment and Plan:    ICD-10-CM   1. Herpes zoster without complication  B02.9        Follow Up Instructions: Treating with valacyclovir as patient reports this is similar to her previous shingles.  Instructed patient to come here to be seen in person or follow-up with her PCP if she notes signs of infection. Patient agrees to plan of care.      I discussed the assessment and treatment plan with the patient. The patient was provided an opportunity to ask questions and all were answered. The patient agreed with the plan and demonstrated an  understanding of the instructions.   The patient was advised to call back or seek an in-person evaluation if the symptoms worsen or if the condition fails to improve as anticipated.      Sharion Balloon, NP  05/28/2019 4:46 PM         Sharion Balloon, NP 05/28/19 [phone]

## 2019-06-24 NOTE — Telephone Encounter (Signed)
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  The employee will need to get continuous FMLA

## 2019-06-24 NOTE — Telephone Encounter (Signed)
Spoke with pt she wanted continuous leave  For 07/26/2019 to 11/02/19

## 2019-06-24 NOTE — Telephone Encounter (Signed)
Zella Ball, will you take a look?

## 2019-06-28 ENCOUNTER — Encounter: Payer: Self-pay | Admitting: Primary Care

## 2019-06-28 ENCOUNTER — Other Ambulatory Visit: Payer: Self-pay

## 2019-06-28 ENCOUNTER — Ambulatory Visit (INDEPENDENT_AMBULATORY_CARE_PROVIDER_SITE_OTHER): Payer: BC Managed Care – PPO | Admitting: Primary Care

## 2019-06-28 DIAGNOSIS — Z0289 Encounter for other administrative examinations: Secondary | ICD-10-CM | POA: Insufficient documentation

## 2019-06-28 NOTE — Patient Instructions (Signed)
We will complete your paperwork one we hear back.  Please update me if/when you receive your first Covid vaccine.  It was a pleasure to see you today! Mayra Reel, NP-C

## 2019-06-28 NOTE — Assessment & Plan Note (Signed)
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 Start date of March 8th 2021 through June 15th, 2021.  We did agree that she could return to work two weeks after her first vaccine, she is agreeable to this. 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 She will update.

## 2019-06-28 NOTE — Progress Notes (Signed)
Subjective:    Patient ID: [name], female    DOB: 1965-05-31, 54 y.o.   MRN: [id number]  HPI  Virtual Visit via Video Note  I connected with [name] on 06/28/19 at  3:00 PM EST by a video enabled telemedicine application and verified that I am speaking with the correct person using two identifiers.  Location: Patient: Home Provider: Office   I discussed the limitations of evaluation and management by telemedicine and the availability of in person appointments. The patient expressed understanding and agreed to proceed.  We attempted to connect via video but the connection was poor. We conducted our visit via phone which lasted   History of Present Illness:  [name] is a 54 year old female with a history of hypertension, heart murmur, iron deficiency anemia, hyperlipidemia, prediabetes who presents today requesting FMLA.  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 Her students will likely return in person on March 8th and she does not wish to return in person until she's vaccinated. She does plan on going in to her school in between March 1st and March 8th to set up her classroom.   She is very worried about contracting Covid-19, especially since she has some of the health conditions mentioned on the CDC web site including murmur, hypertension. She feels as though she's close to being vaccinated.  In early January 2021 when there was a chance she would have to go back in person her blood pressure "shot up" from anxiety. She's checking her BP at home which is running 130-140's/70's, today was 152/92. She ate salty popcorn last night. She is compliant to her lisinopril 10 mg and HCTZ 25 mg daily.   She would like FMLA to start March 8th through June 15th. She would consider coming back into the classroom two weeks after her first vaccine.    Observations/Objective:  Alert and oriented. No  distress. Speaking in complete sentences.  Assessment and Plan:  See problem based charting.  Follow Up Instructions:  We will complete your paperwork one we hear back.  Please update me if/when you receive your first Covid vaccine.  It was a pleasure to see you today! Mayra Reel, NP-C    I discussed the assessment and treatment plan with the patient. The patient was provided an opportunity to ask questions and all were answered. The patient agreed with the plan and demonstrated an understanding of the instructions.   The patient was advised to call back or seek an in-person evaluation if the symptoms worsen or if the condition fails to improve as anticipated.   Doreene Nest, NP    Review of Systems  Eyes: Negative for visual disturbance.  Respiratory: Negative for shortness of breath.   Cardiovascular: Negative for chest pain.  Neurological: Negative for dizziness and headaches.       Past Medical History:  Diagnosis Date  . Anemia   . Depression   . H/O scarlet fever   . Heart murmur   . History of chicken pox   . History of genital warts   . Hypertension   . Plantar fasciitis   . Rash due to allergy    Wheat allergy.  . Shingles      Social History   Socioeconomic History  . Marital status: Married    Spouse name: Not on file  . Number of children: 2  . Years of  education: 66  . Highest education level: Not on file  Occupational History  . Occupation: Public relations account executive  Tobacco Use  . Smoking status: Never Smoker  . Smokeless tobacco: Never Used  Substance and Sexual Activity  . Alcohol use: Yes    Alcohol/week: 6.0 standard drinks    Types: 6 Glasses of wine per week  . Drug use: No  . Sexual activity: Not on file  Other Topics Concern  . Not on file  Social History Narrative   Married.   2 children.   She teaches high school.   Enjoys Barrister's clerk.    Social Determinants of Health   Financial Resource Strain:   . Difficulty of  Paying Living Expenses: Not on file  Food Insecurity:   . Worried About Charity fundraiser in the Last Year: Not on file  . Ran Out of Food in the Last Year: Not on file  Transportation Needs:   . Lack of Transportation (Medical): Not on file  . Lack of Transportation (Non-Medical): Not on file  Physical Activity:   . Days of Exercise per Week: Not on file  . Minutes of Exercise per Session: Not on file  Stress:   . Feeling of Stress : Not on file  Social Connections:   . Frequency of Communication with Friends and Family: Not on file  . Frequency of Social Gatherings with Friends and Family: Not on file  . Attends Religious Services: Not on file  . Active Member of Clubs or Organizations: Not on file  . Attends Archivist Meetings: Not on file  . Marital Status: Not on file  Intimate Partner Violence:   . Fear of Current or Ex-Partner: Not on file  . Emotionally Abused: Not on file  . Physically Abused: Not on file  . Sexually Abused: Not on file    Past Surgical History:  Procedure Laterality Date  . ABDOMINAL HYSTERECTOMY  2005  . KNEE SURGERY Left    2015  . VAGINAL DELIVERY     x2    Family History  Adopted: Yes  Problem Relation Age of Onset  . Hyperlipidemia Mother   . Hypertension Mother   . Colon polyps Mother   . Stroke Maternal Grandmother   . Colon cancer Neg Hx   . Esophageal cancer Neg Hx   . Rectal cancer Neg Hx   . Stomach cancer Neg Hx     Allergies  Allergen Reactions  . Wheat Bran Rash    Can't eat wheat or have wheat as an ingredient    Current Outpatient Medications on File Prior to Visit  Medication Sig Dispense Refill  . amoxicillin-clavulanate (AUGMENTIN) 875-125 MG tablet Take 1 tablet by mouth 2 (two) times daily. 20 tablet 0  . aspirin 81 MG tablet Take 81 mg by mouth daily.    . hydrochlorothiazide (HYDRODIURIL) 25 MG tablet TAKE 1 TABLET(25 MG) BY MOUTH DAILY for blood pressure. 90 tablet 3  . lisinopril (ZESTRIL) 10  MG tablet TAKE 1 TABLET BY MOUTH EVERY DAY FOR BLOOD PRESSURE 90 tablet 3  . valACYclovir (VALTREX) 1000 MG tablet Take 1 tablet (1,000 mg total) by mouth 3 (three) times daily. 21 tablet 0   No current facility-administered medications on file prior to visit.    There were no vitals taken for this visit.   Objective:   Physical Exam  Constitutional: She is oriented to person, place, and time.  Respiratory: Effort normal.  Neurological: She is alert  and oriented to person, place, and time.  Psychiatric: She has a normal mood and affect.           Assessment & Plan:

## 2019-06-28 NOTE — Telephone Encounter (Signed)
Appointment 2/8

## 2019-06-30 NOTE — Telephone Encounter (Signed)
Completed and placed on Robins desk. 

## 2019-06-30 NOTE — Telephone Encounter (Signed)
FMLA paperwork in [name]'s in box °

## 2019-07-02 NOTE — Telephone Encounter (Signed)
Paperwork faxed Copy for pt Copy for scan  Sent my chart message letting pt know

## 2019-07-17 ENCOUNTER — Ambulatory Visit: Payer: BC Managed Care – PPO

## 2019-08-16 ENCOUNTER — Other Ambulatory Visit: Payer: Self-pay

## 2019-08-16 DIAGNOSIS — I10 Essential (primary) hypertension: Secondary | ICD-10-CM

## 2019-08-16 NOTE — Telephone Encounter (Signed)
Valerie pharmacy tech with [facility] pharmacy was calling to verify faxed request for lisinopril-HCTZ 10-25 mg combination drug was received. Per med list appears lisinopril 10 mg and HCTZ 25 mg are separate prescriptions. Vikki Ports ask to ck with providers CMA and request cb. I think this is first time pt has gotten from [facility] in [place].

## 2019-08-17 MED ORDER — HYDROCHLOROTHIAZIDE 25 MG PO TABS: ORAL_TABLET | ORAL | 1 refills | Status: DC

## 2019-08-17 MED ORDER — LISINOPRIL 10 MG PO TABS: ORAL_TABLET | ORAL | 1 refills | Status: DC

## 2019-08-17 NOTE — Telephone Encounter (Signed)
Send patient a message on MyChart 

## 2019-08-17 NOTE — Telephone Encounter (Signed)
Spoken to the tech at [facility] and inform that there is no such combo pill of 10 mg- 25 mg. They are aware, it was the patient who place that request. I have inform patient via MyChart as well.

## 2019-12-07 ENCOUNTER — Other Ambulatory Visit: Payer: Self-pay | Admitting: Primary Care

## 2019-12-07 DIAGNOSIS — I10 Essential (primary) hypertension: Secondary | ICD-10-CM

## 2020-01-13 ENCOUNTER — Telehealth: Payer: Self-pay

## 2020-01-13 NOTE — Telephone Encounter (Signed)
Noted  

## 2020-01-13 NOTE — Telephone Encounter (Signed)
[facility] Night - Client TELEPHONE ADVICE RECORD [facility] Patient Name: [facility] Gender: Female DOB: 22-Apr-1966 Age: 54 Y 3 M 12 D Return Phone Number: [phone] (Primary) Address: City/State/Zip: [place] [address] Client [facility] Night - Client Client Site [facility] - Night Physician Vernona Rieger - NP Contact Type Call Who Is Calling Patient / Member / Family / Caregiver Call Type Triage / Clinical Relationship To Patient Self Return Phone Number [phone] (Primary) Chief Complaint Fever (non urgent symptom) (> THREE MONTHS) Reason for Call Symptomatic / Request for Health Information Initial Comment 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 Worried about COVID. When I have been getting home from school I have been using a Netty Pot to rinse out sinuses. Worried about getting a false negative. Translation No Nurse Assessment Nurse: Cain Saupe, RN, Dena Date/Time (Eastern Time): 01/12/2020 7:15:10 PM Confirm and document reason for call. If symptomatic, describe symptoms. ---Caller states temp 99.6 orally, denies cough, scratchy cough, "flushed" on face/cheek, nasal congestion; denies difficulty breathing. Pt has been vaccinated and using mask/social precautions. Has the patient had close contact with a person known or suspected to have the novel coronavirus illness OR traveled / lives in area with major community spread (including international travel) in the last 14 days from the onset of symptoms? * If Asymptomatic, screen for exposure and travel within the last 14 days. ---Yes Does the patient have any new or worsening symptoms? ---Yes Will a triage be completed? ---Yes Related visit to physician within the last 2 weeks? ---No Does the PT have any chronic conditions? (i.e. diabetes, asthma, this includes High risk factors for  pregnancy, etc.) ---Yes List chronic conditions. ---HTN, obesity Is this a behavioral health or substance abuse call? ---No PLEASE NOTE: All timestamps contained within this report are represented as [place] Standard Time. CONFIDENTIALTY NOTICE: This fax transmission is intended only for the addressee. It contains information that is legally privileged, confidential or otherwise protected from use or disclosure. If you are not the intended recipient, you are strictly prohibited from reviewing, disclosing, copying using or disseminating any of this information or taking any action in reliance on or regarding this information. If you have received this fax in error, please notify us immediately by telephone so that we can arrange for its return to [place]. Phone: [phone], Toll-Free: [phone], Fax: [phone] Page: 2 of 2 Call Id: [id number] Guidelines Guideline Title Affirmed Question Affirmed Notes Nurse Date/Time Lamount Cohen Time) Common Cold [1] Sinus pain (not just congestion) AND [2] fever Cain Saupe, RN, Dena 01/12/2020 7:19:45 PM Disp. Time Lamount Cohen Time) Disposition Final User 01/12/2020 7:26:47 PM See PCP within 24 Hours Yes Cain Saupe, RN, Chaya Jan Caller Disagree/Comply Comply Caller Understands Yes PreDisposition Call Doctor Care Advice Given Per Guideline SEE PCP WITHIN 24 HOURS: * IF OFFICE WILL BE OPEN: You need to be examined within the next 24 hours. Call your doctor (or NP/PA) when the office opens and make an appointment. NASAL WASHES FOR A STUFFY NOSE: * Introduction: Saline (salt water) nasal irrigation (nasal wash) is an effective and simple home remedy for treating stuffy nose and sinus congestion. The nose can be irrigated by pouring, spraying, or squirting salt water into the nose and then letting it run back out. CALL BACK IF: * Fever over 104 F (40 C) * You become worse. CARE ADVICE given per Common Cold (Adult) guideline. Referrals REFERRED TO PCP OFFICE

## 2020-01-13 NOTE — Telephone Encounter (Signed)
I spoke with pt; pt said she is already scheduled today at 9:30 with [facility] for a 12 hr molecular covid test (not a rapid test). Pt had fever last night 101; this morning after ibuprofen temp was 100. Pt has body aches, H/A like an ice pick sticking in forehead, scratchy throat, upper mid abd cramping pain level 2, runny nose and poor appetite. No other covid symptoms. 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 Pt said she is double masking herself. Pt is not in any distress at this time and will cb with covid test results. Pt had pfizer covid vaccine 07/16/19 and 08/06/19 with [facility] Immunization list updated. No available appts at [facility] today. UC and Ed precautions given and pt voiced understanding. Pt will drink plenty of fluids, rest, take tylenol for fever, and pt is self quarantining until gets covid results. Pt appreciative of our concern. FYI to Allayne Gitelman NP.

## 2020-01-16 ENCOUNTER — Other Ambulatory Visit: Payer: Self-pay | Admitting: Primary Care

## 2020-01-16 DIAGNOSIS — I10 Essential (primary) hypertension: Secondary | ICD-10-CM

## 2020-02-09 ENCOUNTER — Other Ambulatory Visit: Payer: Self-pay | Admitting: Primary Care

## 2020-02-09 DIAGNOSIS — Z1231 Encounter for screening mammogram for malignant neoplasm of breast: Secondary | ICD-10-CM

## 2020-03-30 ENCOUNTER — Ambulatory Visit
Admission: RE | Admit: 2020-03-30 | Discharge: 2020-03-30 | Disposition: A | Discharge status: Home / Self Care | Payer: BC Managed Care – PPO | Source: Ambulatory Visit | Attending: Primary Care | Admitting: Primary Care

## 2020-03-30 ENCOUNTER — Other Ambulatory Visit: Payer: Self-pay

## 2020-03-30 DIAGNOSIS — Z1231 Encounter for screening mammogram for malignant neoplasm of breast: Secondary | ICD-10-CM

## 2020-04-28 ENCOUNTER — Other Ambulatory Visit: Payer: Self-pay

## 2020-04-28 ENCOUNTER — Encounter: Payer: Self-pay | Admitting: Primary Care

## 2020-04-28 ENCOUNTER — Ambulatory Visit (INDEPENDENT_AMBULATORY_CARE_PROVIDER_SITE_OTHER): Payer: BC Managed Care – PPO | Admitting: Primary Care

## 2020-04-28 VITALS — BP 124/72 | HR 88 | Temp 97.6°F | Ht 68.0 in | Wt 218.0 lb

## 2020-04-28 DIAGNOSIS — Z0000 Encounter for general adult medical examination without abnormal findings: Secondary | ICD-10-CM | POA: Diagnosis not present

## 2020-04-28 DIAGNOSIS — D509 Iron deficiency anemia, unspecified: Secondary | ICD-10-CM

## 2020-04-28 DIAGNOSIS — R7303 Prediabetes: Secondary | ICD-10-CM

## 2020-04-28 DIAGNOSIS — Z23 Encounter for immunization: Secondary | ICD-10-CM | POA: Diagnosis not present

## 2020-04-28 DIAGNOSIS — J329 Chronic sinusitis, unspecified: Secondary | ICD-10-CM

## 2020-04-28 DIAGNOSIS — I10 Essential (primary) hypertension: Secondary | ICD-10-CM

## 2020-04-28 DIAGNOSIS — N3946 Mixed incontinence: Secondary | ICD-10-CM | POA: Diagnosis not present

## 2020-04-28 NOTE — Progress Notes (Signed)
Subjective:    Patient ID: [name], female    DOB: December 28, 1965, 54 y.o.   MRN: [id number]  HPI  This visit occurred during the SARS-CoV-2 public health emergency.  Safety protocols were in place, including screening questions prior to the visit, additional usage of staff PPE, and extensive cleaning of exam room while observing appropriate contact time as indicated for disinfecting solutions.   [name] is a 54 year old female who presents today for complete physical.  Immunizations: -Tetanus: Completed in 2014 -Influenza: Completed this season  -Shingles: Never completed -Covid-19: Completed series  Diet: She endorses a healthy diet.  Exercise: She is exercising several days weekly.  Eye exam: Completed in 2021 Dental exam: Completes semi-annually   Pap Smear: Hysterectomy  Mammogram: Completed in 2021 Colonoscopy: Completed in 2017, due in 2027 Hep C Screen: Due  BP Readings from Last 3 Encounters:  04/28/20 124/72  06/28/19 (!) 152/92  11/27/18 125/83      Review of Systems  Constitutional: Negative for unexpected weight change.  HENT: Positive for congestion. Negative for rhinorrhea.   Eyes: Negative for visual disturbance.  Respiratory: Negative for cough and shortness of breath.   Cardiovascular: Negative for chest pain.  Gastrointestinal: Negative for constipation and diarrhea.  Genitourinary: Negative for difficulty urinating.  Musculoskeletal: Negative for arthralgias and myalgias.  Skin: Negative for rash.  Allergic/Immunologic: Positive for environmental allergies.  Neurological: Negative for dizziness, numbness and headaches.  Psychiatric/Behavioral: The patient is not nervous/anxious.        Past Medical History:  Diagnosis Date  . Anemia   . Depression   . H/O scarlet fever   . Heart murmur   . History of chicken pox   . History of genital warts   . Hypertension   . Plantar fasciitis   . Rash due to allergy    Wheat allergy.  .  Shingles      Social History   Socioeconomic History  . Marital status: Married    Spouse name: Not on file  . Number of children: 2  . Years of education: 86  . Highest education level: Not on file  Occupational History  . Occupation: Psychologist, forensic  Tobacco Use  . Smoking status: Never Smoker  . Smokeless tobacco: Never Used  Vaping Use  . Vaping Use: Never used  Substance and Sexual Activity  . Alcohol use: Yes    Alcohol/week: 6.0 standard drinks    Types: 6 Glasses of wine per week  . Drug use: No  . Sexual activity: Not on file  Other Topics Concern  . Not on file  Social History Narrative   Married.   2 children.   She teaches high school.   Enjoys Clinical cytogeneticist.    Social Determinants of Health   Financial Resource Strain: Not on file  Food Insecurity: Not on file  Transportation Needs: Not on file  Physical Activity: Not on file  Stress: Not on file  Social Connections: Not on file  Intimate Partner Violence: Not on file    Past Surgical History:  Procedure Laterality Date  . ABDOMINAL HYSTERECTOMY  2005  . KNEE SURGERY Left    2015  . VAGINAL DELIVERY     x2    Family History  Adopted: Yes  Problem Relation Age of Onset  . Hyperlipidemia Mother   . Hypertension Mother   . Colon polyps Mother   . Stroke Maternal Grandmother   . Colon cancer Neg Hx   .  Esophageal cancer Neg Hx   . Rectal cancer Neg Hx   . Stomach cancer Neg Hx     Allergies  Allergen Reactions  . Wheat Bran Rash    Can't eat wheat or have wheat as an ingredient    Current Outpatient Medications on File Prior to Visit  Medication Sig Dispense Refill  . aspirin 81 MG tablet Take 81 mg by mouth daily.    . hydrochlorothiazide (HYDRODIURIL) 25 MG tablet Take 1 tablet by mouth daily for blood pressure. 90 tablet 0  . lisinopril (ZESTRIL) 10 MG tablet Take 1 tablet by mouth every day for blood pressure. 90 tablet 0   No current facility-administered medications on file  prior to visit.    BP 124/72   Pulse 88   Temp 97.6 F (36.4 C) (Temporal)   Ht 5\' 8"  (1.727 m)   Wt 218 lb (98.9 kg)   SpO2 98%   BMI 33.15 kg/m    Objective:   Physical Exam Constitutional:      Appearance: She is well-nourished.  HENT:     Right Ear: Tympanic membrane and ear canal normal.     Left Ear: Tympanic membrane and ear canal normal.     Mouth/Throat:     Mouth: Oropharynx is clear and moist.  Eyes:     Extraocular Movements: EOM normal.     Pupils: Pupils are equal, round, and reactive to light.  Cardiovascular:     Rate and Rhythm: Normal rate and regular rhythm.  Pulmonary:     Effort: Pulmonary effort is normal.     Breath sounds: Normal breath sounds.  Abdominal:     General: Bowel sounds are normal.     Palpations: Abdomen is soft.     Tenderness: There is no abdominal tenderness.  Musculoskeletal:        General: Normal range of motion.     Cervical back: Neck supple.  Skin:    General: Skin is warm and dry.  Neurological:     Mental Status: She is alert and oriented to person, place, and time.     Cranial Nerves: No cranial nerve deficit.     Deep Tendon Reflexes:     Reflex Scores:      Patellar reflexes are 2+ on the right side and 2+ on the left side. Psychiatric:        Mood and Affect: Mood and affect and mood normal.            Assessment & Plan:

## 2020-04-28 NOTE — Assessment & Plan Note (Signed)
Asymptomatic. Repeat CBC pending. 

## 2020-04-28 NOTE — Patient Instructions (Addendum)
Start exercising. You should be getting 150 minutes of moderate intensity exercise weekly.  Continue to work on a healthy diet. Ensure you are consuming 64 ounces of water daily.  Stop by the lab prior to leaving today. I will notify you of your results once received.   It was a pleasure to see you today!   Preventive Care 28-55 Years Old, Female Preventive care refers to visits with your health care provider and lifestyle choices that can promote health and wellness. This includes:  A yearly physical exam. This may also be called an annual well check.  Regular dental visits and eye exams.  Immunizations.  Screening for certain conditions.  Healthy lifestyle choices, such as eating a healthy diet, getting regular exercise, not using drugs or products that contain nicotine and tobacco, and limiting alcohol use. What can I expect for my preventive care visit? Physical exam Your health care provider will check your:  Height and weight. This may be used to calculate body mass index (BMI), which tells if you are at a healthy weight.  Heart rate and blood pressure.  Skin for abnormal spots. Counseling Your health care provider may ask you questions about your:  Alcohol, tobacco, and drug use.  Emotional well-being.  Home and relationship well-being.  Sexual activity.  Eating habits.  Work and work Statistician.  Method of birth control.  Menstrual cycle.  Pregnancy history. What immunizations do I need?  Influenza (flu) vaccine  This is recommended every year. Tetanus, diphtheria, and pertussis (Tdap) vaccine  You may need a Td booster every 10 years. Varicella (chickenpox) vaccine  You may need this if you have not been vaccinated. Zoster (shingles) vaccine  You may need this after age 66. Measles, mumps, and rubella (MMR) vaccine  You may need at least one dose of MMR if you were born in 1957 or later. You may also need a second dose. Pneumococcal  conjugate (PCV13) vaccine  You may need this if you have certain conditions and were not previously vaccinated. Pneumococcal polysaccharide (PPSV23) vaccine  You may need one or two doses if you smoke cigarettes or if you have certain conditions. Meningococcal conjugate (MenACWY) vaccine  You may need this if you have certain conditions. Hepatitis A vaccine  You may need this if you have certain conditions or if you travel or work in places where you may be exposed to hepatitis A. Hepatitis B vaccine  You may need this if you have certain conditions or if you travel or work in places where you may be exposed to hepatitis B. Haemophilus influenzae type b (Hib) vaccine  You may need this if you have certain conditions. Human papillomavirus (HPV) vaccine  If recommended by your health care provider, you may need three doses over 6 months. You may receive vaccines as individual doses or as more than one vaccine together in one shot (combination vaccines). Talk with your health care provider about the risks and benefits of combination vaccines. What tests do I need? Blood tests  Lipid and cholesterol levels. These may be checked every 5 years, or more frequently if you are over 51 years old.  Hepatitis C test.  Hepatitis B test. Screening  Lung cancer screening. You may have this screening every year starting at age 26 if you have a 30-pack-year history of smoking and currently smoke or have quit within the past 15 years.  Colorectal cancer screening. All adults should have this screening starting at age 75 and continuing until  age 55. Your health care provider may recommend screening at age 63 if you are at increased risk. You will have tests every 1-10 years, depending on your results and the type of screening test.  Diabetes screening. This is done by checking your blood sugar (glucose) after you have not eaten for a while (fasting). You may have this done every 1-3  years.  Mammogram. This may be done every 1-2 years. Talk with your health care provider about when you should start having regular mammograms. This may depend on whether you have a family history of breast cancer.  BRCA-related cancer screening. This may be done if you have a family history of breast, ovarian, tubal, or peritoneal cancers.  Pelvic exam and Pap test. This may be done every 3 years starting at age 55. Starting at age 16, this may be done every 5 years if you have a Pap test in combination with an HPV test. Other tests  Sexually transmitted disease (STD) testing.  Bone density scan. This is done to screen for osteoporosis. You may have this scan if you are at high risk for osteoporosis. Follow these instructions at home: Eating and drinking  Eat a diet that includes fresh fruits and vegetables, whole grains, lean protein, and low-fat dairy.  Take vitamin and mineral supplements as recommended by your health care provider.  Do not drink alcohol if: ? Your health care provider tells you not to drink. ? You are pregnant, may be pregnant, or are planning to become pregnant.  If you drink alcohol: ? Limit how much you have to 0-1 drink a day. ? Be aware of how much alcohol is in your drink. In the [place] one drink equals one 12 oz bottle of beer (355 mL), one 5 oz glass of wine (148 mL), or one 1 oz glass of hard liquor (44 mL). Lifestyle  Take daily care of your teeth and gums.  Stay active. Exercise for at least 30 minutes on 5 or more days each week.  Do not use any products that contain nicotine or tobacco, such as cigarettes, e-cigarettes, and chewing tobacco. If you need help quitting, ask your health care provider.  If you are sexually active, practice safe sex. Use a condom or other form of birth control (contraception) in order to prevent pregnancy and STIs (sexually transmitted infections).  If told by your health care provider, take low-dose aspirin daily  starting at age 17. What's next?  Visit your health care provider once a year for a well check visit.  Ask your health care provider how often you should have your eyes and teeth checked.  Stay up to date on all vaccines. This information is not intended to replace advice given to you by your health care provider. Make sure you discuss any questions you have with your health care provider. Document Revised: 01/15/2018 Document Reviewed: 01/15/2018 Elsevier Patient Education  2020 [employer].

## 2020-04-28 NOTE — Addendum Note (Signed)
Addended by: Donnamarie Poag on: 04/28/2020 03:44 PM   Modules accepted: Orders

## 2020-04-28 NOTE — Assessment & Plan Note (Signed)
Well controlled in the office today, continue lisinopril 10 mg and HCTZ 25 mg. CMP due and pending.

## 2020-04-28 NOTE — Assessment & Plan Note (Signed)
Discussed the importance of a healthy diet and regular exercise in order for weight loss, and to reduce the risk of any potential medical problems.  Repeat A1C pending. 

## 2020-04-28 NOTE — Assessment & Plan Note (Signed)
First Shingrix provided today. Other immunizations UTD. Mammogram UTD. Colonoscopy UTD due, in 2024.  Discussed the importance of a healthy diet and regular exercise in order for weight loss, and to reduce the risk of any potential medical problems.  Exam today unremarkable. Labs pending.

## 2020-04-28 NOTE — Assessment & Plan Note (Signed)
Chronic and ongoing, overall stable. History of hysterectomy.   No concerns overall.

## 2020-04-28 NOTE — Assessment & Plan Note (Signed)
Ongoing, worse with masks due to Covid-19.  Discussed use of nasal sprays, antihistamines.

## 2020-04-29 LAB — COMPREHENSIVE METABOLIC PANEL
AG Ratio: 1.5 (calc) (ref 1.0–2.5)
ALT: 24 U/L (ref 6–29)
AST: 23 U/L (ref 10–35)
Albumin: 4.2 g/dL (ref 3.6–5.1)
Alkaline phosphatase (APISO): 90 U/L (ref 37–153)
BUN: 15 mg/dL (ref 7–25)
CO2: 27 mmol/L (ref 20–32)
Calcium: 9.3 mg/dL (ref 8.6–10.4)
Chloride: 100 mmol/L (ref 98–110)
Creat: 0.68 mg/dL (ref 0.50–1.05)
Globulin: 2.8 g/dL (calc) (ref 1.9–3.7)
Glucose, Bld: 90 mg/dL (ref 65–99)
Potassium: 3.5 mmol/L (ref 3.5–5.3)
Sodium: 136 mmol/L (ref 135–146)
Total Bilirubin: 0.3 mg/dL (ref 0.2–1.2)
Total Protein: 7 g/dL (ref 6.1–8.1)

## 2020-04-29 LAB — CBC
HCT: 37.6 % (ref 35.0–45.0)
Hemoglobin: 12.5 g/dL (ref 11.7–15.5)
MCH: 26.8 pg — ABNORMAL LOW (ref 27.0–33.0)
MCHC: 33.2 g/dL (ref 32.0–36.0)
MCV: 80.5 fL (ref 80.0–100.0)
MPV: 10.3 fL (ref 7.5–12.5)
Platelets: 326 10*3/uL (ref 140–400)
RBC: 4.67 10*6/uL (ref 3.80–5.10)
RDW: 14.5 % (ref 11.0–15.0)
WBC: 9.9 10*3/uL (ref 3.8–10.8)

## 2020-04-29 LAB — HEMOGLOBIN A1C
Hgb A1c MFr Bld: 5.8 % of total Hgb — ABNORMAL HIGH (ref <5.7)
Mean Plasma Glucose: 120 mg/dL
eAG (mmol/L): 6.6 mmol/L

## 2020-04-29 LAB — LIPID PANEL
Cholesterol: 215 mg/dL — ABNORMAL HIGH (ref <200)
HDL: 51 mg/dL (ref >=50)
LDL Cholesterol (Calc): 141 mg/dL (calc) — ABNORMAL HIGH
Non-HDL Cholesterol (Calc): 164 mg/dL (calc) — ABNORMAL HIGH (ref <130)
Total CHOL/HDL Ratio: 4.2 (calc) (ref <5.0)
Triglycerides: 116 mg/dL (ref <150)

## 2020-05-11 NOTE — Telephone Encounter (Signed)
Irving Burton front office mgr gave me my chart note pt was requesting an appt for possible UTI. I tried all pt contact #s and unable to reach pt; sending note to [facility] CMA.

## 2020-05-15 ENCOUNTER — Ambulatory Visit
Admission: EM | Admit: 2020-05-15 | Discharge: 2020-05-15 | Disposition: A | Discharge status: Home / Self Care | Payer: BC Managed Care – PPO | Attending: Family Medicine | Admitting: Family Medicine

## 2020-05-15 DIAGNOSIS — R319 Hematuria, unspecified: Secondary | ICD-10-CM | POA: Diagnosis present

## 2020-05-15 LAB — POCT URINALYSIS DIP (MANUAL ENTRY)
Bilirubin, UA: NEGATIVE
Glucose, UA: NEGATIVE mg/dL
Ketones, POC UA: NEGATIVE mg/dL
Nitrite, UA: NEGATIVE
Protein Ur, POC: NEGATIVE mg/dL
Spec Grav, UA: 1.02 (ref 1.010–1.025)
Urobilinogen, UA: 0.2 E.U./dL
pH, UA: 7.5 (ref 5.0–8.0)

## 2020-05-15 MED ORDER — CEPHALEXIN 500 MG PO CAPS: 500.0000 mg | ORAL_CAPSULE | Freq: Two times a day (BID) | ORAL | 0 refills | Status: AC

## 2020-05-15 NOTE — ED Provider Notes (Signed)
[name]    CSN: [id number] Arrival date & time: 05/15/20  4742      History   Chief Complaint No chief complaint on file.   HPI [name] is a 54 y.o. female.   Patient is a 54 year old female who presents today with dysuria, hematuria for the past couple days.  Symptoms have been constant.  She has been taking AZO over-the-counter, vitamin C and drinking fluids but still has symptoms.  She is having some lower back pain.  No flank pain, fevers, nausea or vomiting.     Past Medical History:  Diagnosis Date  . Anemia   . Depression   . H/O scarlet fever   . Heart murmur   . History of chicken pox   . History of genital warts   . Hypertension   . Plantar fasciitis   . Rash due to allergy    Wheat allergy.  . Shingles     Patient Active Problem List   Diagnosis Date Noted  . Encounter for completion of form with patient 06/28/2019  . Chronic sinusitis 11/27/2018  . Haglund's deformity of right heel 12/27/2016  . Hyperlipidemia 01/09/2016  . Prediabetes 01/09/2016  . Preventative health care 01/09/2016  . Urinary incontinence 01/09/2016  . Achilles tendonitis, bilateral 10/24/2015  . Closed fracture of part of upper end of humerus 06/18/2015  . Iron deficiency anemia 12/29/2013  . Essential hypertension 12/29/2013  . Plantar fasciitis 12/29/2013  . Obesity (BMI 30.0-34.9) 12/29/2013    Past Surgical History:  Procedure Laterality Date  . ABDOMINAL HYSTERECTOMY  2005  . KNEE SURGERY Left    2015  . VAGINAL DELIVERY     x2    OB History   No obstetric history on file.      Home Medications    Prior to Admission medications   Medication Sig Start Date End Date Taking? Authorizing Provider  cephALEXin (KEFLEX) 500 MG capsule Take 1 capsule (500 mg total) by mouth 2 (two) times daily for 5 days. 05/15/20 05/20/20 Yes Nikola Marone A, NP  aspirin 81 MG tablet Take 81 mg by mouth daily.    [provider]  hydrochlorothiazide  (HYDRODIURIL) 25 MG tablet Take 1 tablet by mouth daily for blood pressure. 01/17/20   Doreene Nest, NP  lisinopril (ZESTRIL) 10 MG tablet Take 1 tablet by mouth every day for blood pressure. 01/17/20   Doreene Nest, NP    Family History Family History  Adopted: Yes  Problem Relation Age of Onset  . Hyperlipidemia Mother   . Hypertension Mother   . Colon polyps Mother   . Stroke Maternal Grandmother   . Colon cancer Neg Hx   . Esophageal cancer Neg Hx   . Rectal cancer Neg Hx   . Stomach cancer Neg Hx     Social History Social History   Tobacco Use  . Smoking status: Never Smoker  . Smokeless tobacco: Never Used  Vaping Use  . Vaping Use: Never used  Substance Use Topics  . Alcohol use: Yes    Alcohol/week: 6.0 standard drinks    Types: 6 Glasses of wine per week  . Drug use: No     Allergies   Wheat bran   Review of Systems Review of Systems   Physical Exam Triage Vital Signs ED Triage Vitals  Enc Vitals Group     BP      Pulse      Resp  Temp      Temp src      SpO2      Weight      Height      Head Circumference      Peak Flow      Pain Score      Pain Loc      Pain Edu?      Excl. in GC?    No data found.  Updated Vital Signs There were no vitals taken for this visit.  Visual Acuity Right Eye Distance:   Left Eye Distance:   Bilateral Distance:    Right Eye Near:   Left Eye Near:    Bilateral Near:     Physical Exam Vitals and nursing note reviewed.  Constitutional:      General: She is not in acute distress.    Appearance: Normal appearance. She is not ill-appearing, toxic-appearing or diaphoretic.  HENT:     Head: Normocephalic.     Nose: Nose normal.  Pulmonary:     Effort: Pulmonary effort is normal.  Musculoskeletal:        General: Normal range of motion.     Cervical back: Normal range of motion.  Skin:    General: Skin is warm and dry.     Findings: No rash.  Neurological:     Mental Status: She is  alert.  Psychiatric:        Mood and Affect: Mood normal.      UC Treatments / Results  Labs (all labs ordered are listed, but only abnormal results are displayed) Labs Reviewed  POCT URINALYSIS DIP (MANUAL ENTRY) - Abnormal; Notable for the following components:      Result Value   Blood, UA small (*)    Leukocytes, UA Trace (*)    All other components within normal limits  URINE CULTURE    EKG   Radiology No results found.  Procedures Procedures (including critical care time)  Medications Ordered in UC Medications - No data to display  Initial Impression / Assessment and Plan / UC Course  I have reviewed the triage vital signs and the nursing notes.  Pertinent labs & imaging results that were available during my care of the patient were reviewed by me and considered in my medical decision making (see chart for details).     Hematuria Urine with trace leuks and small blood.  Sending for culture.  We will go ahead and treat for urinary tract infection based on symptoms Push fluids and continue AZO as needed. Follow up as needed for continued or worsening symptoms  Final Clinical Impressions(s) / UC Diagnoses   Final diagnoses:  Hematuria, unspecified type     Discharge Instructions     Treating you for a urinary tract infection.  Take the medication as prescribed. Drink plenty of fluids.  AZO over-the-counter as needed.    ED Prescriptions    Medication Sig Dispense Auth. Provider   cephALEXin (KEFLEX) 500 MG capsule Take 1 capsule (500 mg total) by mouth 2 (two) times daily for 5 days. 10 capsule Dahlia Byes A, NP     PDMP not reviewed this encounter.   Janace Aris, NP 05/15/20 1209

## 2020-05-15 NOTE — Discharge Instructions (Addendum)
Treating you for a urinary tract infection.  Take the medication as prescribed. Drink plenty of fluids.  AZO over-the-counter as needed.

## 2020-05-17 LAB — URINE CULTURE: Culture: 50000 — AB

## 2020-05-23 ENCOUNTER — Ambulatory Visit: Payer: BC Managed Care – PPO | Admitting: Primary Care

## 2020-05-24 ENCOUNTER — Ambulatory Visit: Payer: BC Managed Care – PPO | Admitting: Primary Care

## 2020-06-05 ENCOUNTER — Ambulatory Visit: Payer: BC Managed Care – PPO | Admitting: Primary Care

## 2020-06-09 ENCOUNTER — Telehealth: Payer: Self-pay

## 2020-06-09 ENCOUNTER — Ambulatory Visit: Payer: BC Managed Care – PPO | Admitting: Primary Care

## 2020-06-09 ENCOUNTER — Other Ambulatory Visit: Payer: Self-pay

## 2020-06-09 NOTE — Telephone Encounter (Signed)
Patient was in office for her appt. On time she was concerned why she was having a follow up for bladder infection when she usually doesn't. Patient has been resch for reason of appt since 05/24/2020. She was seen at urgent care before christmas and was giving medication and was feeling fine. Stated she didn't need appt any more. Appt was cancelled

## 2020-06-09 NOTE — Telephone Encounter (Signed)
Noted,

## 2020-06-09 NOTE — Telephone Encounter (Signed)
Was this my 10:00? So she came in but did not want to be seen?

## 2020-06-09 NOTE — Telephone Encounter (Signed)
Yes this was the 10:00. She asked what her appt was for and told her. She stated that she already been seen at urgent care for the matter and was feeling better and had already had medication for it. So we decided to cancel the appt since she was feeling fine and didn't really need to be seen

## 2020-06-10 ENCOUNTER — Other Ambulatory Visit: Payer: Self-pay

## 2020-06-10 DIAGNOSIS — I10 Essential (primary) hypertension: Secondary | ICD-10-CM

## 2020-06-10 MED ORDER — LISINOPRIL 10 MG PO TABS: ORAL_TABLET | ORAL | 1 refills | Status: DC

## 2020-06-14 ENCOUNTER — Other Ambulatory Visit: Payer: Self-pay

## 2020-06-14 DIAGNOSIS — I10 Essential (primary) hypertension: Secondary | ICD-10-CM

## 2020-06-14 MED ORDER — HYDROCHLOROTHIAZIDE 25 MG PO TABS: 25.0000 mg | ORAL_TABLET | Freq: Every day | ORAL | 0 refills | Status: DC

## 2020-07-26 ENCOUNTER — Ambulatory Visit (INDEPENDENT_AMBULATORY_CARE_PROVIDER_SITE_OTHER): Payer: BC Managed Care – PPO

## 2020-07-26 DIAGNOSIS — Z23 Encounter for immunization: Secondary | ICD-10-CM

## 2020-07-26 NOTE — Progress Notes (Signed)
Per orders of Mayra Reel, 2nd injection of shingrix, given by Erby Pian. Patient tolerated injection well.

## 2020-08-29 DIAGNOSIS — I10 Essential (primary) hypertension: Secondary | ICD-10-CM

## 2020-08-31 MED ORDER — HYDROCHLOROTHIAZIDE 25 MG PO TABS
25.0000 mg | ORAL_TABLET | Freq: Every day | ORAL | 2 refills | Status: DC
Start: 2020-08-31 — End: 2021-06-18

## 2020-08-31 MED ORDER — LISINOPRIL 10 MG PO TABS: ORAL_TABLET | ORAL | 1 refills | Status: DC

## 2021-05-01 ENCOUNTER — Other Ambulatory Visit: Payer: Self-pay | Admitting: Primary Care

## 2021-05-01 DIAGNOSIS — Z1231 Encounter for screening mammogram for malignant neoplasm of breast: Secondary | ICD-10-CM

## 2021-05-30 ENCOUNTER — Ambulatory Visit
Admission: RE | Admit: 2021-05-30 | Discharge: 2021-05-30 | Disposition: A | Discharge status: Home / Self Care | Payer: BC Managed Care – PPO | Source: Ambulatory Visit

## 2021-05-30 DIAGNOSIS — Z1231 Encounter for screening mammogram for malignant neoplasm of breast: Secondary | ICD-10-CM

## 2021-06-16 ENCOUNTER — Other Ambulatory Visit: Payer: Self-pay | Admitting: Primary Care

## 2021-06-16 DIAGNOSIS — I10 Essential (primary) hypertension: Secondary | ICD-10-CM

## 2021-06-18 NOTE — Telephone Encounter (Signed)
Due for office visit. Can be CPE or follow up.  Please schedule.

## 2021-06-18 NOTE — Telephone Encounter (Signed)
Called pt to get scheduled for CPE. Lvmtcb to scheduled.

## 2021-08-03 ENCOUNTER — Other Ambulatory Visit: Payer: Self-pay | Admitting: Primary Care

## 2021-08-03 DIAGNOSIS — I10 Essential (primary) hypertension: Secondary | ICD-10-CM

## 2021-08-05 MED ORDER — LISINOPRIL 10 MG PO TABS: ORAL_TABLET | ORAL | 0 refills | Status: DC

## 2021-08-05 MED ORDER — HYDROCHLOROTHIAZIDE 25 MG PO TABS: 25.0000 mg | ORAL_TABLET | Freq: Every day | ORAL | 0 refills | Status: DC

## 2021-08-21 ENCOUNTER — Other Ambulatory Visit: Payer: Self-pay | Admitting: Primary Care

## 2021-08-21 DIAGNOSIS — I10 Essential (primary) hypertension: Secondary | ICD-10-CM

## 2021-09-07 ENCOUNTER — Ambulatory Visit: Payer: BC Managed Care – PPO | Admitting: Primary Care

## 2021-09-07 ENCOUNTER — Encounter: Payer: Self-pay | Admitting: Primary Care

## 2021-09-07 VITALS — BP 120/88 | HR 84 | Ht 68.0 in | Wt 233.2 lb

## 2021-09-07 DIAGNOSIS — R7303 Prediabetes: Secondary | ICD-10-CM

## 2021-09-07 DIAGNOSIS — Z8249 Family history of ischemic heart disease and other diseases of the circulatory system: Secondary | ICD-10-CM

## 2021-09-07 DIAGNOSIS — Z0001 Encounter for general adult medical examination with abnormal findings: Secondary | ICD-10-CM | POA: Diagnosis not present

## 2021-09-07 DIAGNOSIS — I10 Essential (primary) hypertension: Secondary | ICD-10-CM

## 2021-09-07 DIAGNOSIS — E785 Hyperlipidemia, unspecified: Secondary | ICD-10-CM

## 2021-09-07 DIAGNOSIS — F411 Generalized anxiety disorder: Secondary | ICD-10-CM | POA: Insufficient documentation

## 2021-09-07 LAB — COMPREHENSIVE METABOLIC PANEL
ALT: 23 U/L (ref 0–35)
AST: 22 U/L (ref 0–37)
Albumin: 4.1 g/dL (ref 3.5–5.2)
Alkaline Phosphatase: 90 U/L (ref 39–117)
BUN: 9 mg/dL (ref 6–23)
CO2: 28 mEq/L (ref 19–32)
Calcium: 8.9 mg/dL (ref 8.4–10.5)
Chloride: 101 mEq/L (ref 96–112)
Creatinine, Ser: 0.66 mg/dL (ref 0.40–1.20)
GFR: 97.71 mL/min (ref >60.00)
Glucose, Bld: 86 mg/dL (ref 70–99)
Potassium: 3.8 mEq/L (ref 3.5–5.1)
Sodium: 137 mEq/L (ref 135–145)
Total Bilirubin: 0.4 mg/dL (ref 0.2–1.2)
Total Protein: 6.9 g/dL (ref 6.0–8.3)

## 2021-09-07 LAB — LIPID PANEL
Cholesterol: 206 mg/dL — ABNORMAL HIGH (ref 0–200)
HDL: 53.2 mg/dL (ref >39.00)
LDL Cholesterol: 133 mg/dL — ABNORMAL HIGH (ref 0–99)
NonHDL: 153.19
Total CHOL/HDL Ratio: 4
Triglycerides: 102 mg/dL (ref 0.0–149.0)
VLDL: 20.4 mg/dL (ref 0.0–40.0)

## 2021-09-07 LAB — CBC
HCT: 37.9 % (ref 36.0–46.0)
Hemoglobin: 12.3 g/dL (ref 12.0–15.0)
MCHC: 32.5 g/dL (ref 30.0–36.0)
MCV: 84.1 fl (ref 78.0–100.0)
Platelets: 268 10*3/uL (ref 150.0–400.0)
RBC: 4.5 Mil/uL (ref 3.87–5.11)
RDW: 14.5 % (ref 11.5–15.5)
WBC: 8.6 10*3/uL (ref 4.0–10.5)

## 2021-09-07 LAB — HEMOGLOBIN A1C: Hgb A1c MFr Bld: 6.1 % (ref 4.6–6.5)

## 2021-09-07 NOTE — Assessment & Plan Note (Signed)
Encouraged healthy diet regular exercise. ? ?Repeat lipid panel pending. ? ?Consider statin therapy. ?

## 2021-09-07 NOTE — Assessment & Plan Note (Signed)
Discussed the importance of a healthy diet and regular exercise in order for weight loss, and to reduce the risk of further co-morbidity. ? ?Repeat A1C pending. ?

## 2021-09-07 NOTE — Progress Notes (Signed)
? ?Subjective:  ? ? Patient ID: [name], female    DOB: 05-25-1965, 56 y.o.   MRN: [id number] ? ?HPI ? ?[name] is a very pleasant 56 y.o. female who presents today for complete physical and follow up of chronic conditions. ? ?She would like to discuss anterior shoulder tension. Located to the bilateral anterior shoulders. This occurs during stressful situations. She denies exertional chest pain, shortness of breath, or exertional shoulder pain. She suspects her symptoms are secondary to anxiety. She does have chronic anxiety, overall feels that she can control her symptoms. She denies a family history of CAD and stroke in either parents, but she does have a family history of heart attacks in both grandmothers which occurred in their 56's.  ? ?Immunizations: ?-Tetanus: 2014 ?-Influenza: Due this season ?-Covid-19: 3 vaccines ?-Shingles: Completed Shingrix ? ?Diet: Fair diet.  ?Exercise: No regular exercise. ? ?Eye exam: Completes annually  ?Dental exam: Completes semi-annually  ? ?Pap Smear: Hysterectomy ?Mammogram: Completed in January 2023 ?Colonoscopy: Completed in 2017, due 2027 ? ?BP Readings from Last 3 Encounters:  ?09/07/21 120/88  ?05/15/20 115/65  ?04/28/20 124/72  ? ? ? ? ? ?Review of Systems  ?Constitutional:  Negative for unexpected weight change.  ?HENT:  Negative for rhinorrhea.   ?Eyes:  Negative for visual disturbance.  ?Respiratory:  Negative for cough and shortness of breath.   ?Cardiovascular:  Negative for chest pain.  ?Gastrointestinal:  Negative for constipation and diarrhea.  ?Genitourinary:  Negative for difficulty urinating.  ?Musculoskeletal:  Positive for myalgias. Negative for arthralgias.  ?Skin:  Negative for rash.  ?Allergic/Immunologic: Negative for environmental allergies.  ?Neurological:  Negative for dizziness and headaches.  ?Psychiatric/Behavioral:  The patient is nervous/anxious.   ? ?   ? ? ?Past Medical History:  ?Diagnosis Date  ? Anemia   ? Depression   ?  H/O scarlet fever   ? Heart murmur   ? History of chicken pox   ? History of genital warts   ? Hypertension   ? Plantar fasciitis   ? Rash due to allergy   ? Wheat allergy.  ? Shingles   ? ? ?Social History  ? ?Socioeconomic History  ? Marital status: Married  ?  Spouse name: Not on file  ? Number of children: 2  ? Years of education: 72  ? Highest education level: Not on file  ?Occupational History  ? Occupation: Psychologist, forensic  ?Tobacco Use  ? Smoking status: Never  ? Smokeless tobacco: Never  ?Vaping Use  ? Vaping Use: Never used  ?Substance and Sexual Activity  ? Alcohol use: Yes  ?  Alcohol/week: 6.0 standard drinks  ?  Types: 6 Glasses of wine per week  ? Drug use: No  ? Sexual activity: Not on file  ?Other Topics Concern  ? Not on file  ?Social History Narrative  ? Married.  ? 2 children.  ? She teaches high school.  ? Enjoys crafting.   ? ?Social Determinants of Health  ? ?Financial Resource Strain: Not on file  ?Food Insecurity: Not on file  ?Transportation Needs: Not on file  ?Physical Activity: Not on file  ?Stress: Not on file  ?Social Connections: Not on file  ?Intimate Partner Violence: Not on file  ? ? ?Past Surgical History:  ?Procedure Laterality Date  ? ABDOMINAL HYSTERECTOMY  2005  ? KNEE SURGERY Left   ? 2015  ? VAGINAL DELIVERY    ? x2  ? ? ?Family History  ?  Adopted: Yes  ?Problem Relation Age of Onset  ? Hyperlipidemia Mother   ? Hypertension Mother   ? Colon polyps Mother   ? Stroke Maternal Grandmother   ? Colon cancer Neg Hx   ? Esophageal cancer Neg Hx   ? Rectal cancer Neg Hx   ? Stomach cancer Neg Hx   ? ? ?Allergies  ?Allergen Reactions  ? Wheat Bran Rash  ?  Can't eat wheat or have wheat as an ingredient  ? ? ?Current Outpatient Medications on File Prior to Visit  ?Medication Sig Dispense Refill  ? aspirin 81 MG tablet Take 81 mg by mouth daily.    ? hydrochlorothiazide (HYDRODIURIL) 25 MG tablet Take 1 tablet (25 mg total) by mouth daily. for blood pressure. Office visit required  for further refills. 30 tablet 0  ? lisinopril (ZESTRIL) 10 MG tablet Take 1 tablet by mouth daily for blood pressure. Office visit required for further refills. 30 tablet 0  ? loratadine (CLARITIN) 10 MG tablet Take 10 mg by mouth daily. Once a day    ? ?No current facility-administered medications on file prior to visit.  ? ? ?BP 120/88   Pulse 84   Ht 5\' 8"  (1.727 m)   Wt 233 lb 3.2 oz (105.8 kg)   SpO2 97%   BMI 35.46 kg/m?  ?Objective:  ? Physical Exam ?HENT:  ?   Right Ear: Tympanic membrane and ear canal normal.  ?   Left Ear: Tympanic membrane and ear canal normal.  ?   Nose: Nose normal.  ?Eyes:  ?   Conjunctiva/sclera: Conjunctivae normal.  ?   Pupils: Pupils are equal, round, and reactive to light.  ?Neck:  ?   Thyroid: No thyromegaly.  ?Cardiovascular:  ?   Rate and Rhythm: Normal rate and regular rhythm.  ?   Heart sounds: No murmur heard. ?Pulmonary:  ?   Effort: Pulmonary effort is normal.  ?   Breath sounds: Normal breath sounds. No rales.  ?Abdominal:  ?   General: Bowel sounds are normal.  ?   Palpations: Abdomen is soft.  ?   Tenderness: There is no abdominal tenderness.  ?Musculoskeletal:     ?   General: Normal range of motion.  ?   Cervical back: Neck supple.  ?Lymphadenopathy:  ?   Cervical: No cervical adenopathy.  ?Skin: ?   General: Skin is warm and dry.  ?   Findings: No rash.  ?Neurological:  ?   Mental Status: She is alert and oriented to person, place, and time.  ?   Cranial Nerves: No cranial nerve deficit.  ?   Deep Tendon Reflexes: Reflexes are normal and symmetric.  ?Psychiatric:     ?   Mood and Affect: Mood normal.  ? ? ? ? ? ?   ?Assessment & Plan:  ? ? ? ? ?This visit occurred during the SARS-CoV-2 public health emergency.  Safety protocols were in place, including screening questions prior to the visit, additional usage of staff PPE, and extensive cleaning of exam room while observing appropriate contact time as indicated for disinfecting solutions.  ?

## 2021-09-07 NOTE — Assessment & Plan Note (Signed)
Immunizations up-to-date. ?Mammogram up-to-date. ?Colonoscopy up-to-date, due 2027. ? ?Discussed the importance of a healthy diet and regular exercise in order for weight loss, and to reduce the risk of further co-morbidity. ? ?Exam today stable ?Labs pending ?

## 2021-09-07 NOTE — Assessment & Plan Note (Signed)
Controlled. ? ?Continue HCTZ 25 mg daily, lisinopril 10 mg daily. ? ?CMP pending.  ?

## 2021-09-07 NOTE — Patient Instructions (Signed)
Stop by the lab prior to leaving today. I will notify you of your results once received.  ? ?You will be contacted regarding your referral to cardiology.  Please let us know if you have not been contacted within two weeks.  ? ?It was a pleasure to see you today! ? ?Preventive Care 25-57 Years Old, Female ?Preventive care refers to lifestyle choices and visits with your health care provider that can promote health and wellness. Preventive care visits are also called wellness exams. ?What can I expect for my preventive care visit? ?Counseling ?Your health care provider may ask you questions about your: ?Medical history, including: ?Past medical problems. ?Family medical history. ?Pregnancy history. ?Current health, including: ?Menstrual cycle. ?Method of birth control. ?Emotional well-being. ?Home life and relationship well-being. ?Sexual activity and sexual health. ?Lifestyle, including: ?Alcohol, nicotine or tobacco, and drug use. ?Access to firearms. ?Diet, exercise, and sleep habits. ?Work and work Statistician. ?Sunscreen use. ?Safety issues such as seatbelt and bike helmet use. ?Physical exam ?Your health care provider will check your: ?Height and weight. These may be used to calculate your BMI (body mass index). BMI is a measurement that tells if you are at a healthy weight. ?Waist circumference. This measures the distance around your waistline. This measurement also tells if you are at a healthy weight and may help predict your risk of certain diseases, such as type 2 diabetes and high blood pressure. ?Heart rate and blood pressure. ?Body temperature. ?Skin for abnormal spots. ?What immunizations do I need? ? ?Vaccines are usually given at various ages, according to a schedule. Your health care provider will recommend vaccines for you based on your age, medical history, and lifestyle or other factors, such as travel or where you work. ?What tests do I need? ?Screening ?Your health care provider may recommend  screening tests for certain conditions. This may include: ?Lipid and cholesterol levels. ?Diabetes screening. This is done by checking your blood sugar (glucose) after you have not eaten for a while (fasting). ?Pelvic exam and Pap test. ?Hepatitis B test. ?Hepatitis C test. ?HIV (human immunodeficiency virus) test. ?STI (sexually transmitted infection) testing, if you are at risk. ?Lung cancer screening. ?Colorectal cancer screening. ?Mammogram. Talk with your health care provider about when you should start having regular mammograms. This may depend on whether you have a family history of breast cancer. ?BRCA-related cancer screening. This may be done if you have a family history of breast, ovarian, tubal, or peritoneal cancers. ?Bone density scan. This is done to screen for osteoporosis. ?Talk with your health care provider about your test results, treatment options, and if necessary, the need for more tests. ?Follow these instructions at home: ?Eating and drinking ? ?Eat a diet that includes fresh fruits and vegetables, whole grains, lean protein, and low-fat dairy products. ?Take vitamin and mineral supplements as recommended by your health care provider. ?Do not drink alcohol if: ?Your health care provider tells you not to drink. ?You are pregnant, may be pregnant, or are planning to become pregnant. ?If you drink alcohol: ?Limit how much you have to 0-1 drink a day. ?Know how much alcohol is in your drink. In the [place] one drink equals one 12 oz bottle of beer (355 mL), one 5 oz glass of wine (148 mL), or one 1? oz glass of hard liquor (44 mL). ?Lifestyle ?Brush your teeth every morning and night with fluoride toothpaste. Floss one time each day. ?Exercise for at least 30 minutes 5 or more days each week. ?  Do not use any products that contain nicotine or tobacco. These products include cigarettes, chewing tobacco, and vaping devices, such as e-cigarettes. If you need help quitting, ask your health care  provider. ?Do not use drugs. ?If you are sexually active, practice safe sex. Use a condom or other form of protection to prevent STIs. ?If you do not wish to become pregnant, use a form of birth control. If you plan to become pregnant, see your health care provider for a prepregnancy visit. ?Take aspirin only as told by your health care provider. Make sure that you understand how much to take and what form to take. Work with your health care provider to find out whether it is safe and beneficial for you to take aspirin daily. ?Find healthy ways to manage stress, such as: ?Meditation, yoga, or listening to music. ?Journaling. ?Talking to a trusted person. ?Spending time with friends and family. ?Minimize exposure to UV radiation to reduce your risk of skin cancer. ?Safety ?Always wear your seat belt while driving or riding in a vehicle. ?Do not drive: ?If you have been drinking alcohol. Do not ride with someone who has been drinking. ?When you are tired or distracted. ?While texting. ?If you have been using any mind-altering substances or drugs. ?Wear a helmet and other protective equipment during sports activities. ?If you have firearms in your house, make sure you follow all gun safety procedures. ?Seek help if you have been physically or sexually abused. ?What's next? ?Visit your health care provider once a year for an annual wellness visit. ?Ask your health care provider how often you should have your eyes and teeth checked. ?Stay up to date on all vaccines. ?This information is not intended to replace advice given to you by your health care provider. Make sure you discuss any questions you have with your health care provider. ?Document Revised: 11/01/2020 Document Reviewed: 11/01/2020 ?Elsevier Patient Education ? [place]. ? ?

## 2021-09-07 NOTE — Assessment & Plan Note (Signed)
Overall controlled. ? ?Symptoms of shoulder tension seem to be secondary to anxiety, but will rule out cardiac cause. ? ?Referral placed to cardiology for further evaluation, especially given her personal history and her family history of early CAD. ?

## 2021-09-10 ENCOUNTER — Other Ambulatory Visit: Payer: Self-pay | Admitting: Primary Care

## 2021-09-10 DIAGNOSIS — I10 Essential (primary) hypertension: Secondary | ICD-10-CM

## 2021-09-11 ENCOUNTER — Other Ambulatory Visit: Payer: Self-pay | Admitting: Family Medicine

## 2021-09-11 DIAGNOSIS — I10 Essential (primary) hypertension: Secondary | ICD-10-CM

## 2021-09-11 MED ORDER — HYDROCHLOROTHIAZIDE 25 MG PO TABS: 25.0000 mg | ORAL_TABLET | Freq: Every day | ORAL | 0 refills | Status: DC

## 2021-09-11 MED ORDER — LISINOPRIL 10 MG PO TABS: ORAL_TABLET | ORAL | 0 refills | Status: DC

## 2021-09-12 ENCOUNTER — Telehealth: Payer: Self-pay

## 2021-09-12 DIAGNOSIS — E785 Hyperlipidemia, unspecified: Secondary | ICD-10-CM

## 2021-09-12 DIAGNOSIS — R7303 Prediabetes: Secondary | ICD-10-CM

## 2021-09-12 DIAGNOSIS — I10 Essential (primary) hypertension: Secondary | ICD-10-CM

## 2021-09-12 NOTE — Telephone Encounter (Signed)
Called and lvm for patient to call us back regarding her lab results ?

## 2021-09-12 NOTE — Telephone Encounter (Signed)
-----   Message from Lynnda Child, MD sent at 09/12/2021  2:25 PM EDT ----- ?Please call patient to relay unread mychart message ? ?

## 2021-09-13 NOTE — Telephone Encounter (Signed)
Called patient states that she has received her my chart message with lab results and was very upset. She felt like the message was was received was not very understanding of what she has been trying. I advised patient that was not how was intended at all. That unfortunately at times our emotions are not expressed in written messages. She states that she did want to let you know that she has been making diet changes and working activity changes for last 3-4 months. She had just got back from trip before labs were done. She will continue to work on diet.  ? ?Did you want patient to come in for lab only appointment in 6 months? Would it be a good idea to call and set up follow up a few days after to be able to review labs with patient? Also would like pone communication in future.  ? 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 She has spent last two days on phone during break and not able to get in touch. I advised patient that I will call office for her and send my chart message to let her know time/date that we are able to get set up.  ?

## 2021-09-17 NOTE — Telephone Encounter (Signed)
Thank you for speaking to patient and explaining the meaning behind her message. I know she's been working to make changes and I'm very proud of her for that! I basically just meant to keep up the hard work! ? ?Yes, repeat cholesterol again in 6 months, lab appt. It will be difficult to remember to call her with results rather than sending a my chart result note.  ? ?Would it be best for her to deactivate her my chart account so that we are prompted to just call? ? ? ?

## 2021-09-21 ENCOUNTER — Other Ambulatory Visit: Payer: Self-pay | Admitting: Family Medicine

## 2021-09-21 DIAGNOSIS — I10 Essential (primary) hypertension: Secondary | ICD-10-CM

## 2021-09-25 MED ORDER — HYDROCHLOROTHIAZIDE 25 MG PO TABS: 25.0000 mg | ORAL_TABLET | Freq: Every day | ORAL | 3 refills | Status: DC

## 2021-09-25 MED ORDER — LISINOPRIL 10 MG PO TABS: ORAL_TABLET | ORAL | 3 refills | Status: DC

## 2021-09-25 NOTE — Addendum Note (Signed)
Addended by: Doreene Nest on: 09/25/2021 04:31 PM ? ? Modules accepted: Orders ? ?

## 2021-09-25 NOTE — Telephone Encounter (Signed)
Pt called back and scheduled her LAB. She said she doesn't want to deactivate her mychart because she does like it but she would prefer [place] only send a message if its an appt that needs to be scheduled and for results or other things for [place] to call. ? ?Pt is also requesting for [place] to send in a 90d/s for her  ?hydrochlorothiazide (HYDRODIURIL) 25 MG tablet ?lisinopril (ZESTRIL) 10 MG tablet ?[place] ?

## 2021-09-25 NOTE — Telephone Encounter (Signed)
Left message to call back  

## 2021-09-25 NOTE — Telephone Encounter (Signed)
I am not sure how we can remember to always call patient regarding results, especially with an active MyChart account. ? ?I will do my very best to remember.  ? ?Noted. Refill(s) sent to pharmacy. ? ?

## 2021-10-09 ENCOUNTER — Telehealth: Payer: BC Managed Care – PPO | Admitting: Family

## 2021-10-30 ENCOUNTER — Ambulatory Visit: Payer: BC Managed Care – PPO | Admitting: Cardiology

## 2021-10-30 ENCOUNTER — Encounter: Payer: Self-pay | Admitting: Cardiology

## 2021-10-30 VITALS — BP 128/90 | HR 80 | Ht 68.0 in | Wt 234.5 lb

## 2021-10-30 DIAGNOSIS — E7800 Pure hypercholesterolemia, unspecified: Secondary | ICD-10-CM | POA: Diagnosis not present

## 2021-10-30 DIAGNOSIS — R072 Precordial pain: Secondary | ICD-10-CM

## 2021-10-30 DIAGNOSIS — I10 Essential (primary) hypertension: Secondary | ICD-10-CM

## 2021-10-30 MED ORDER — IVABRADINE HCL 5 MG PO TABS: 15.0000 mg | ORAL_TABLET | Freq: Once | ORAL | 0 refills | Status: AC

## 2021-10-30 MED ORDER — METOPROLOL TARTRATE 100 MG PO TABS: 100.0000 mg | ORAL_TABLET | Freq: Once | ORAL | 0 refills | Status: DC

## 2021-10-30 NOTE — Patient Instructions (Signed)
Medication Instructions:   Your physician recommends that you continue on your current medications as directed. Please refer to the Current Medication list given to you today.  *If you need a refill on your cardiac medications before your next appointment, please call your pharmacy*   Lab Work:  Please go to the [facility] after your appointment today for a lab (BMP) draw.   Testing/Procedures:   Your physician has requested that you have an echocardiogram. Echocardiography is a painless test that uses sound waves to create images of your heart. It provides your doctor with information about the size and shape of your heart and how well your heart's chambers and valves are working. This procedure takes approximately one hour. There are no restrictions for this procedure.   2.   Your physician has requested that you have cardiac CT. Cardiac computed tomography (CT) is a painless test that uses an x-ray machine to take clear, detailed pictures of your heart.    Your cardiac CT will be scheduled at:  [facility] [address] [place], [place] [address] [phone]  Please arrive 15 mins early for check-in and test prep.    Please follow these instructions carefully (unless otherwise directed):   On the Night Before the Test: Be sure to Drink plenty of water. Do not consume any caffeinated/decaffeinated beverages or chocolate 12 hours prior to your test.    On the Day of the Test: Drink plenty of water until 1 hour prior to the test. Do not eat any food 4 hours prior to the test. You may take your regular medications prior to the test.  Take metoprolol (Lopressor) 100 MG two hours prior to test. Take Ivabradine (Corlanor) 15 MG two hours prior to test. HOLD Hydrochlorothiazide morning of the test. FEMALES- please wear underwire-free bra if available, avoid dresses & tight clothing        After the Test: Drink plenty of  water. After receiving IV contrast, you may experience a mild flushed feeling. This is normal. On occasion, you may experience a mild rash up to 24 hours after the test. This is not dangerous. If this occurs, you can take Benadryl 25 mg and increase your fluid intake. If you experience trouble breathing, this can be serious. If it is severe call 911 IMMEDIATELY. If it is mild, please call our office. If you take any of these medications: Glipizide/Metformin, Avandament, Glucavance, please do not take 48 hours after completing test unless otherwise instructed.  Please allow 2-4 weeks for scheduling of routine cardiac CTs. Some insurance companies require a pre-authorization which may delay scheduling of this test.   For non-scheduling related questions, please contact the cardiac imaging nurse navigator should you have any questions/concerns: Rockwell Alexandria, Cardiac Imaging Nurse Navigator Larey Brick, Cardiac Imaging Nurse Navigator [facility] Direct Office Dial: [phone]   For scheduling needs, including cancellations and rescheduling, please call [place], [phone].     Follow-Up: At [facility], you and your health needs are our priority.  As part of our continuing mission to provide you with exceptional heart care, we have created designated Provider Care Teams.  These Care Teams include your primary Cardiologist (physician) and Advanced Practice Providers (APPs -  Physician Assistants and Nurse Practitioners) who all work together to provide you with the care you need, when you need it.  We recommend signing up for the patient portal called "MyChart".  Sign up information is provided on  this After Visit Summary.  MyChart is used to connect with patients for Virtual Visits (Telemedicine).  Patients are able to view lab/test results, encounter notes, upcoming appointments, etc.  Non-urgent messages can be sent to your provider as well.   To learn more  about what you can do with MyChart, go to [email or web address].    Your next appointment:   Follow up after testing   The format for your next appointment:   In Person  Provider:   You may see Debbe Odea, MD or one of the following Advanced Practice Providers on your designated Care Team:   Nicolasa Ducking, NP Eula Listen, PA-C Cadence Fransico Michael, [place]    Other Instructions   Important Information About Sugar

## 2021-10-30 NOTE — Progress Notes (Signed)
Cardiology Office Note:    Date:  10/30/2021   ID:  [name], DOB [birth year], MRN [id number]  PCP:  [name], NP   [facility] Providers Cardiologist:  None     Referring MD: [name], NP   Chief Complaint  Patient presents with   Other    HLD/CAD. Meds reviewed verbally with pt.   [name] is a 56 y.o. female who is being seen today for the evaluation of chest pain at the request of [name], NP.   History of Present Illness:    [name] is a 56 y.o. female with a hx of hypertension, hyperlipidemia presents due to chest pain.  Symptoms have been ongoing over the past 2 years, she states symptoms usually associated with stress.  Describes discomfort as chest tightness lasting couple of hours, improving when she is able to calm herself down.  Denies any personal or family history of heart disease.  Takes medications for blood pressure.  Denies smoking.  Symptoms not associated with exertion.  Past Medical History:  Diagnosis Date   Anemia    Depression    H/O scarlet fever    Heart murmur    History of chicken pox    History of genital warts    Hypertension    Plantar fasciitis    Rash due to allergy    Wheat allergy.   Shingles     Past Surgical History:  Procedure Laterality Date   ABDOMINAL HYSTERECTOMY  2005   KNEE SURGERY Left    2015   VAGINAL DELIVERY     x2    Current Medications: Current Meds  Medication Sig   aspirin 81 MG tablet Take 81 mg by mouth every other day.   hydrochlorothiazide (HYDRODIURIL) 25 MG tablet Take 1 tablet (25 mg total) by mouth daily. for blood pressure.   ivabradine (CORLANOR) 5 MG TABS tablet Take 3 tablets (15 mg total) by mouth once for 1 dose. Take 2 hours prior to your CT Scan   lisinopril (ZESTRIL) 10 MG tablet Take 1 tablet by mouth daily for blood pressure.   loratadine (CLARITIN) 10 MG tablet Take 10 mg by mouth daily. Once a day   metoprolol tartrate  (LOPRESSOR) 100 MG tablet Take 1 tablet (100 mg total) by mouth once for 1 dose. Take 2 hours prior to your CT scan.   TURMERIC PO Take by mouth daily.     Allergies:   Wheat bran   Social History   Socioeconomic History   Marital status: Married    Spouse name: Not on file   Number of children: 2   Years of education: 18   Highest education level: Not on file  Occupational History   Occupation: Public relations account executive  Tobacco Use   Smoking status: Never   Smokeless tobacco: Never  Vaping Use   Vaping Use: Never used  Substance and Sexual Activity   Alcohol use: Yes    Alcohol/week: 6.0 standard drinks of alcohol    Types: 6 Glasses of wine per week    Comment: social   Drug use: No   Sexual activity: Not on file  Other Topics Concern   Not on file  Social History Narrative   Married.   2 children.   She teaches high school.   Enjoys Barrister's clerk.    Social Determinants of Health   Financial Resource Strain: Not on file  Food Insecurity: Not on file  Transportation Needs: Not on file  Physical Activity: Not on file  Stress: Not on file  Social Connections: Not on file     Family History: The patient's family history includes Colon polyps in her mother; Hyperlipidemia in her mother; Hypertension in her mother; Stroke in her maternal grandmother. There is no history of Colon cancer, Esophageal cancer, Rectal cancer, or Stomach cancer. She was adopted.  ROS:   Please see the history of present illness.     All other systems reviewed and are negative.  EKGs/Labs/Other Studies Reviewed:    The following studies were reviewed today:   EKG:  EKG is  ordered today.  The ekg ordered today demonstrates normal sinus rhythm, normal ECG  Recent Labs: 09/07/2021: ALT 23; BUN 9; Creatinine, Ser 0.66; Hemoglobin 12.3; Platelets 268.0; Potassium 3.8; Sodium 137  Recent Lipid Panel    Component Value Date/Time   CHOL 206 (H) 09/07/2021 1239   TRIG 102.0 09/07/2021 1239   HDL  53.20 09/07/2021 1239   CHOLHDL 4 09/07/2021 1239   VLDL 20.4 09/07/2021 1239   LDLCALC 133 (H) 09/07/2021 1239   LDLCALC 141 (H) 04/28/2020 1515     Risk Assessment/Calculations:          Physical Exam:    VS:  BP 128/90 (BP Location: Right Arm, Patient Position: Sitting, Cuff Size: Normal)   Pulse 80   Ht 5\' 8"  (1.727 m)   Wt 234 lb 8 oz (106.4 kg)   SpO2 98%   BMI 35.66 kg/m     Wt Readings from Last 3 Encounters:  10/30/21 234 lb 8 oz (106.4 kg)  09/07/21 233 lb 3.2 oz (105.8 kg)  04/28/20 218 lb (98.9 kg)     GEN:  Well nourished, well developed in no acute distress HEENT: Normal NECK: No JVD; No carotid bruits CARDIAC: RRR, faint systolic normal RESPIRATORY:  Clear to auscultation without rales, wheezing or rhonchi  ABDOMEN: Soft, non-tender, non-distended MUSCULOSKELETAL:  No edema; No deformity  SKIN: Warm and dry NEUROLOGIC:  Alert and oriented x 3 PSYCHIATRIC:  Normal affect   ASSESSMENT:    1. Precordial pain   2. Primary hypertension   3. Pure hypercholesterolemia    PLAN:    In order of problems listed above:  Chest pain, risk factors hypertension, hyperlipidemia.  Get echo, get coronary CTA to evaluate CAD. Hypertension, BP controlled.  Continue lisinopril, HCTZ. Hyperlipidemia, 10-year ASCVD risk 3.5%.  Not a statin benefit group.  Recommend low-cholesterol diet.  Follow-up after cardiac testing.      Medication Adjustments/Labs and Tests Ordered: Current medicines are reviewed at length with the patient today.  Concerns regarding medicines are outlined above.  Orders Placed This Encounter  Procedures   CT CORONARY MORPH W/CTA COR W/SCORE W/CA W/CM &/OR WO/CM   Basic metabolic panel   EKG 12-Lead   ECHOCARDIOGRAM COMPLETE   Meds ordered this encounter  Medications   metoprolol tartrate (LOPRESSOR) 100 MG tablet    Sig: Take 1 tablet (100 mg total) by mouth once for 1 dose. Take 2 hours prior to your CT scan.    Dispense:  1 tablet     Refill:  0   ivabradine (CORLANOR) 5 MG TABS tablet    Sig: Take 3 tablets (15 mg total) by mouth once for 1 dose. Take 2 hours prior to your CT Scan    Dispense:  3 tablet    Refill:  0    Patient Instructions  Medication Instructions:  Your physician recommends that you continue on your current medications as directed. Please refer to the Current Medication list given to you today.  *If you need a refill on your cardiac medications before your next appointment, please call your pharmacy*   Lab Work:  Please go to the [facility] after your appointment today for a lab (BMP) draw.   Testing/Procedures:   Your physician has requested that you have an echocardiogram. Echocardiography is a painless test that uses sound waves to create images of your heart. It provides your doctor with information about the size and shape of your heart and how well your heart's chambers and valves are working. This procedure takes approximately one hour. There are no restrictions for this procedure.   2.   Your physician has requested that you have cardiac CT. Cardiac computed tomography (CT) is a painless test that uses an x-ray machine to take clear, detailed pictures of your heart.    Your cardiac CT will be scheduled at:  [facility] [address] [place], [place] [address] [phone]  Please arrive 15 mins early for check-in and test prep.    Please follow these instructions carefully (unless otherwise directed):   On the Night Before the Test: Be sure to Drink plenty of water. Do not consume any caffeinated/decaffeinated beverages or chocolate 12 hours prior to your test.    On the Day of the Test: Drink plenty of water until 1 hour prior to the test. Do not eat any food 4 hours prior to the test. You may take your regular medications prior to the test.  Take metoprolol (Lopressor) 100 MG two hours prior to test. Take  Ivabradine (Corlanor) 15 MG two hours prior to test. HOLD Hydrochlorothiazide morning of the test. FEMALES- please wear underwire-free bra if available, avoid dresses & tight clothing        After the Test: Drink plenty of water. After receiving IV contrast, you may experience a mild flushed feeling. This is normal. On occasion, you may experience a mild rash up to 24 hours after the test. This is not dangerous. If this occurs, you can take Benadryl 25 mg and increase your fluid intake. If you experience trouble breathing, this can be serious. If it is severe call 911 IMMEDIATELY. If it is mild, please call our office. If you take any of these medications: Glipizide/Metformin, Avandament, Glucavance, please do not take 48 hours after completing test unless otherwise instructed.  Please allow 2-4 weeks for scheduling of routine cardiac CTs. Some insurance companies require a pre-authorization which may delay scheduling of this test.   For non-scheduling related questions, please contact the cardiac imaging nurse navigator should you have any questions/concerns: Marchia Bond, Cardiac Imaging Nurse Navigator Gordy Clement, Cardiac Imaging Nurse Navigator [facility] Direct Office Dial: [phone]   For scheduling needs, including cancellations and rescheduling, please call [place], [phone].     Follow-Up: At [facility], you and your health needs are our priority.  As part of our continuing mission to provide you with exceptional heart care, we have created designated Provider Care Teams.  These Care Teams include your primary Cardiologist (physician) and Advanced Practice Providers (APPs -  Physician Assistants and Nurse Practitioners) who all work together to provide you with the care you need, when you need it.  We recommend signing up for the patient portal called "MyChart".  Sign up information is provided on this After Visit Summary.  MyChart is  used to connect with patients for Virtual Visits (Telemedicine).  Patients are able to view lab/test results, encounter notes, upcoming appointments, etc.  Non-urgent messages can be sent to your provider as well.   To learn more about what you can do with MyChart, go to [email or web address].    Your next appointment:   Follow up after testing   The format for your next appointment:   In Person  Provider:   You may see Kate Sable, MD or one of the following Advanced Practice Providers on your designated Care Team:   Murray Hodgkins, NP Christell Faith, PA-C Cadence Kathlen Mody, [place]    Other Instructions   Important Information About Sugar         Signed, Kate Sable, MD  10/30/2021 8:56 AM    [place]

## 2021-10-31 ENCOUNTER — Telehealth (HOSPITAL_COMMUNITY): Payer: Self-pay | Admitting: Emergency Medicine

## 2021-10-31 NOTE — Telephone Encounter (Signed)
Reaching out to patient to offer assistance regarding upcoming cardiac imaging study; pt verbalizes understanding of appt date/time, parking situation and where to check in, pre-test NPO status and medications ordered, and verified current allergies; name and call back number provided for further questions should they arise Rockwell Alexandria RN Navigator Cardiac Imaging [facility] [phone] office [phone] cell  Denies iv issues Arrival 745 100mg  metoprolol + 15mg  ivabradine (reminded to pick up from pharm)

## 2021-11-01 ENCOUNTER — Ambulatory Visit
Admission: RE | Admit: 2021-11-01 | Discharge: 2021-11-01 | Disposition: A | Discharge status: Home / Self Care | Payer: BC Managed Care – PPO | Source: Ambulatory Visit | Attending: Cardiology | Admitting: Cardiology

## 2021-11-01 DIAGNOSIS — R072 Precordial pain: Secondary | ICD-10-CM

## 2021-11-01 MED ORDER — IOHEXOL 350 MG/ML SOLN
100.0000 mL | Freq: Once | INTRAVENOUS | Status: AC | PRN
  Administered 2021-11-01: 100 mL via INTRAVENOUS

## 2021-11-01 MED ORDER — NITROGLYCERIN 0.4 MG SL SUBL
0.4000 mg | SUBLINGUAL_TABLET | Freq: Once | SUBLINGUAL | Status: AC
  Administered 2021-11-01: 0.4 mg via SUBLINGUAL

## 2021-11-01 NOTE — Progress Notes (Signed)
Patient tolerated CT well. Drank water after. Vital signs stable encourage to drink water throughout day.Reasons explained and verbalized understanding. Ambulated steady gait.  

## 2021-11-29 ENCOUNTER — Ambulatory Visit (INDEPENDENT_AMBULATORY_CARE_PROVIDER_SITE_OTHER): Payer: BC Managed Care – PPO

## 2021-11-29 DIAGNOSIS — R072 Precordial pain: Secondary | ICD-10-CM | POA: Diagnosis not present

## 2021-11-29 LAB — ECHOCARDIOGRAM COMPLETE
AR max vel: 1.59 cm2
AV Area VTI: 1.72 cm2
AV Area mean vel: 1.8 cm2
AV Mean grad: 10 mmHg
AV Peak grad: 17.6 mmHg
Ao pk vel: 2.1 m/s
Area-P 1/2: 2.64 cm2
Calc EF: 67.3 %
S' Lateral: 2.6 cm
Single Plane A2C EF: 74.4 %
Single Plane A4C EF: 63.3 %

## 2021-12-26 ENCOUNTER — Ambulatory Visit: Payer: BC Managed Care – PPO | Admitting: Cardiology

## 2021-12-26 ENCOUNTER — Encounter: Payer: Self-pay | Admitting: Cardiology

## 2021-12-26 VITALS — BP 130/80 | HR 93 | Ht 68.0 in | Wt 234.0 lb

## 2021-12-26 DIAGNOSIS — E7800 Pure hypercholesterolemia, unspecified: Secondary | ICD-10-CM

## 2021-12-26 DIAGNOSIS — I2510 Atherosclerotic heart disease of native coronary artery without angina pectoris: Secondary | ICD-10-CM | POA: Diagnosis not present

## 2021-12-26 DIAGNOSIS — I10 Essential (primary) hypertension: Secondary | ICD-10-CM

## 2021-12-26 DIAGNOSIS — I2584 Coronary atherosclerosis due to calcified coronary lesion: Secondary | ICD-10-CM | POA: Diagnosis not present

## 2021-12-26 NOTE — Progress Notes (Signed)
Cardiology Office Note:    Date:  12/26/2021   ID:  [name], DOB [birth year], MRN [id number]  PCP:  Doreene Nest, NP   [facility] Providers Cardiologist:  None     Referring MD: Doreene Nest, NP   Chief Complaint  Patient presents with   Follow-up    F/U after cardiac testing     History of Present Illness:    [name] is a 56 y.o. female with a hx of hypertension, hyperlipidemia presents due to chest pain.  Previously seen with symptoms of chest pain ongoing over the past 2 years.  Usually associated with stress.  Due to risk factors, echocardiogram and coronary CTA was ordered to evaluate presence of CAD.  States doing okay, trying to eat healthier to lose weight.  Patient was called with brief results of coronary CTA which showed minimal nonobstructive disease, echo showed normal EF.  She states feeling much better ever since receiving the news.  She thinks her symptoms might have been anxiety induced.  She has no concerns at this time.  Prior notes/studies Coronary CTA 10/2021 minimal stenosis in mid RCA and LAD, calcium score 43.9 Echo 11/2020 EF 60 to 65%  Past Medical History:  Diagnosis Date   Anemia    Depression    H/O scarlet fever    Heart murmur    History of chicken pox    History of genital warts    Hypertension    Plantar fasciitis    Rash due to allergy    Wheat allergy.   Shingles     Past Surgical History:  Procedure Laterality Date   ABDOMINAL HYSTERECTOMY  2005   KNEE SURGERY Left    2015   VAGINAL DELIVERY     x2    Current Medications: Current Meds  Medication Sig   aspirin 81 MG tablet Take 81 mg by mouth every other day.   hydrochlorothiazide (HYDRODIURIL) 25 MG tablet Take 1 tablet (25 mg total) by mouth daily. for blood pressure.   lisinopril (ZESTRIL) 10 MG tablet Take 1 tablet by mouth daily for blood pressure.   loratadine (CLARITIN) 10 MG tablet Take 10 mg by mouth daily. Once a day   TURMERIC PO  Take by mouth daily.     Allergies:   Wheat bran   Social History   Socioeconomic History   Marital status: Married    Spouse name: Not on file   Number of children: 2   Years of education: 18   Highest education level: Not on file  Occupational History   Occupation: Psychologist, forensic  Tobacco Use   Smoking status: Never   Smokeless tobacco: Never  Vaping Use   Vaping Use: Never used  Substance and Sexual Activity   Alcohol use: Yes    Alcohol/week: 6.0 standard drinks of alcohol    Types: 6 Glasses of wine per week    Comment: social   Drug use: No   Sexual activity: Not on file  Other Topics Concern   Not on file  Social History Narrative   Married.   2 children.   She teaches high school.   Enjoys Clinical cytogeneticist.    Social Determinants of Health   Financial Resource Strain: Not on file  Food Insecurity: Not on file  Transportation Needs: Not on file  Physical Activity: Not on file  Stress: Not on file  Social Connections: Not on file     Family History: The patient's  family history includes Colon polyps in her mother; Hyperlipidemia in her mother; Hypertension in her mother; Stroke in her maternal grandmother. There is no history of Colon cancer, Esophageal cancer, Rectal cancer, or Stomach cancer. She was adopted.  ROS:   Please see the history of present illness.     All other systems reviewed and are negative.  EKGs/Labs/Other Studies Reviewed:    The following studies were reviewed today:   EKG:  EKG not ordered today.    Recent Labs: 09/07/2021: ALT 23; BUN 9; Creatinine, Ser 0.66; Hemoglobin 12.3; Platelets 268.0; Potassium 3.8; Sodium 137  Recent Lipid Panel    Component Value Date/Time   CHOL 206 (H) 09/07/2021 1239   TRIG 102.0 09/07/2021 1239   HDL 53.20 09/07/2021 1239   CHOLHDL 4 09/07/2021 1239   VLDL 20.4 09/07/2021 1239   LDLCALC 133 (H) 09/07/2021 1239   LDLCALC 141 (H) 04/28/2020 1515     Risk Assessment/Calculations:           Physical Exam:    VS:  BP 130/80 (BP Location: Left Arm, Patient Position: Sitting, Cuff Size: Large)   Pulse 93   Ht 5\' 8"  (1.727 m)   Wt 234 lb (106.1 kg)   SpO2 98%   BMI 35.58 kg/m     Wt Readings from Last 3 Encounters:  12/26/21 234 lb (106.1 kg)  10/30/21 234 lb 8 oz (106.4 kg)  09/07/21 233 lb 3.2 oz (105.8 kg)     GEN:  Well nourished, well developed in no acute distress HEENT: Normal NECK: No JVD; No carotid bruits CARDIAC: RRR, faint systolic normal RESPIRATORY:  Clear to auscultation without rales, wheezing or rhonchi  ABDOMEN: Soft, non-tender, non-distended MUSCULOSKELETAL:  No edema; No deformity  SKIN: Warm and dry NEUROLOGIC:  Alert and oriented x 3 PSYCHIATRIC:  Normal affect   ASSESSMENT:    1. Coronary artery calcification   2. Primary hypertension   3. Pure hypercholesterolemia     PLAN:    In order of problems listed above:  Chest pain, echo shows normal EF 60-65%, CCTA with minimal (<25%) RCA and LAD dz, calcium score 43.9.  Aspirin 81 mg okay.  Chest pain resolved Hypertension, BP controlled.  Continue lisinopril, HCTZ. Hyperlipidemia, 10-year ASCVD risk 3.5%.  Low-cholesterol diet advised, if cholesterol stays elevated despite diet and exercise, plan to start statin.  Follow-up in 1 year      Medication Adjustments/Labs and Tests Ordered: Current medicines are reviewed at length with the patient today.  Concerns regarding medicines are outlined above.  No orders of the defined types were placed in this encounter.  No orders of the defined types were placed in this encounter.   Patient Instructions  Medication Instructions:  Your physician recommends that you continue on your current medications as directed. Please refer to the Current Medication list given to you today.  *If you need a refill on your cardiac medications before your next appointment, please call your pharmacy*   Lab Work: None ordered  If you have labs (blood  work) drawn today and your tests are completely normal, you will receive your results only by: MyChart Message (if you have MyChart) OR A paper copy in the mail If you have any lab test that is abnormal or we need to change your treatment, we will call you to review the results.   Testing/Procedures: None ordered   Follow-Up: At [facility], you and your health needs are our priority.  As part of our  continuing mission to provide you with exceptional heart care, we have created designated Provider Care Teams.  These Care Teams include your primary Cardiologist (physician) and Advanced Practice Providers (APPs -  Physician Assistants and Nurse Practitioners) who all work together to provide you with the care you need, when you need it.  We recommend signing up for the patient portal called "MyChart".  Sign up information is provided on this After Visit Summary.  MyChart is used to connect with patients for Virtual Visits (Telemedicine).  Patients are able to view lab/test results, encounter notes, upcoming appointments, etc.  Non-urgent messages can be sent to your provider as well.   To learn more about what you can do with MyChart, go to [email or web address].    Your next appointment:   Your physician wants you to follow-up in: 1 year You will receive a reminder letter in the mail two months in advance. If you don't receive a letter, please call our office to schedule the follow-up appointment.   The format for your next appointment:   In Person  Provider:   You may see Debbe Odea, MD or one of the following Advanced Practice Providers on your designated Care Team:   Nicolasa Ducking, NP Eula Listen, PA-C Cadence Fransico Michael, [place]   Other Instructions N/A  Important Information About Sugar         Signed, Debbe Odea, MD  12/26/2021 9:27 AM    [facility]

## 2021-12-26 NOTE — Patient Instructions (Signed)
Medication Instructions:   Your physician recommends that you continue on your current medications as directed. Please refer to the Current Medication list given to you today.   *If you need a refill on your cardiac medications before your next appointment, please call your pharmacy*   Lab Work: None ordered  If you have labs (blood work) drawn today and your tests are completely normal, you will receive your results only by: MyChart Message (if you have MyChart) OR A paper copy in the mail If you have any lab test that is abnormal or we need to change your treatment, we will call you to review the results.   Testing/Procedures: None ordered   Follow-Up: At [facility], you and your health needs are our priority.  As part of our continuing mission to provide you with exceptional heart care, we have created designated Provider Care Teams.  These Care Teams include your primary Cardiologist (physician) and Advanced Practice Providers (APPs -  Physician Assistants and Nurse Practitioners) who all work together to provide you with the care you need, when you need it.  We recommend signing up for the patient portal called "MyChart".  Sign up information is provided on this After Visit Summary.  MyChart is used to connect with patients for Virtual Visits (Telemedicine).  Patients are able to view lab/test results, encounter notes, upcoming appointments, etc.  Non-urgent messages can be sent to your provider as well.   To learn more about what you can do with MyChart, go to [email or web address].    Your next appointment:   Your physician wants you to follow-up in: 1 year.   You will receive a reminder letter in the mail two months in advance. If you don't receive a letter, please call our office to schedule the follow-up appointment.   The format for your next appointment:   In Person  Provider:   You may see Brian Agbor-Etang, MD or one of the following Advanced Practice Providers  on your designated Care Team:   Christopher Berge, NP Ryan Dunn, PA-C Cadence Furth, PA-C   Other Instructions N/A  Important Information About Sugar       

## 2022-03-27 ENCOUNTER — Other Ambulatory Visit (INDEPENDENT_AMBULATORY_CARE_PROVIDER_SITE_OTHER): Payer: BC Managed Care – PPO

## 2022-03-27 DIAGNOSIS — E785 Hyperlipidemia, unspecified: Secondary | ICD-10-CM

## 2022-03-27 DIAGNOSIS — R7303 Prediabetes: Secondary | ICD-10-CM | POA: Diagnosis not present

## 2022-03-28 LAB — LIPID PANEL
Cholesterol: 185 mg/dL (ref 0–200)
HDL: 46.3 mg/dL (ref >39.00)
LDL Cholesterol: 116 mg/dL — ABNORMAL HIGH (ref 0–99)
NonHDL: 138.87
Total CHOL/HDL Ratio: 4
Triglycerides: 116 mg/dL (ref 0.0–149.0)
VLDL: 23.2 mg/dL (ref 0.0–40.0)

## 2022-03-28 LAB — HEMOGLOBIN A1C: Hgb A1c MFr Bld: 6.1 % (ref 4.6–6.5)

## 2022-06-25 ENCOUNTER — Other Ambulatory Visit: Payer: Self-pay | Admitting: Primary Care

## 2022-06-25 DIAGNOSIS — I10 Essential (primary) hypertension: Secondary | ICD-10-CM

## 2022-06-26 NOTE — Telephone Encounter (Signed)
Sent patient a MyChart message

## 2022-06-26 NOTE — Telephone Encounter (Signed)
LVM for patient to call back and scheduled. 

## 2022-06-26 NOTE — Telephone Encounter (Signed)
Patient is due for CPE/follow up in late April, this will be required prior to any further refills.  Please schedule, thank you!   

## 2022-06-27 ENCOUNTER — Other Ambulatory Visit: Payer: Self-pay | Admitting: Primary Care

## 2022-06-27 DIAGNOSIS — Z1231 Encounter for screening mammogram for malignant neoplasm of breast: Secondary | ICD-10-CM

## 2022-06-27 NOTE — Telephone Encounter (Signed)
LVM for patient to c/b

## 2022-07-22 IMAGING — MG DIGITAL SCREENING BILAT W/ TOMO W/ CAD
8 series · 8 of 24 positions shown · non-contrast
Comparison: Previous exam(s).

CLINICAL DATA: Screening.

EXAM:
DIGITAL SCREENING BILATERAL MAMMOGRAM WITH TOMO AND CAD

[R CC synth-2D]
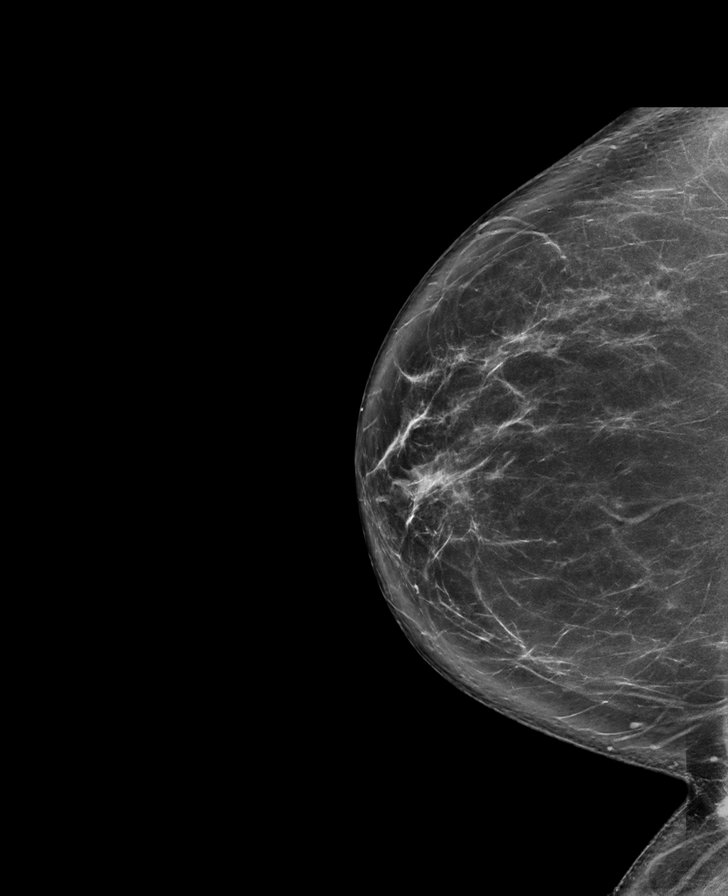

[R MLO synth-2D]
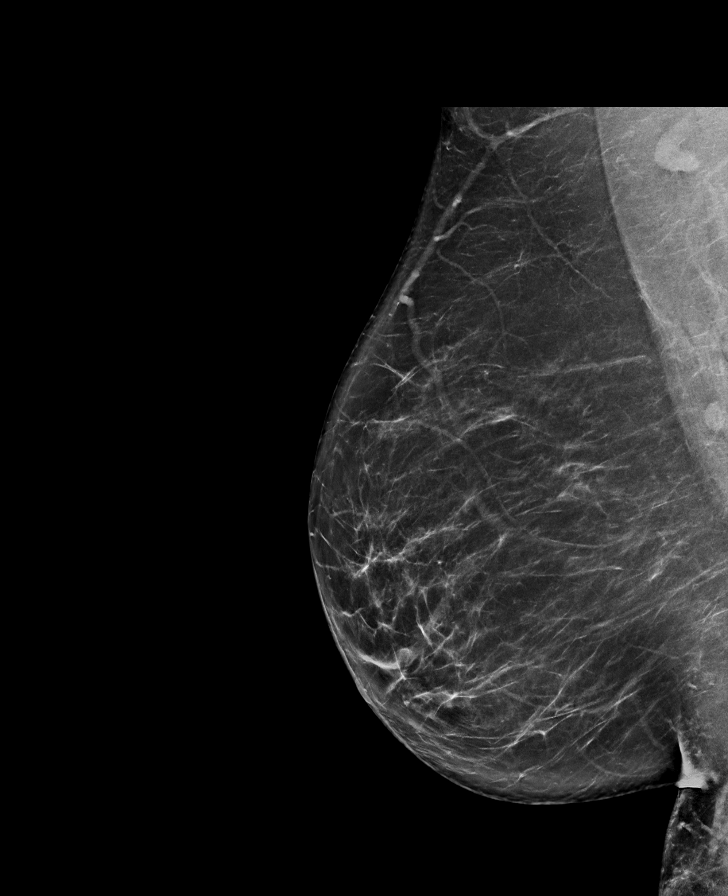

[L MLO synth-2D]
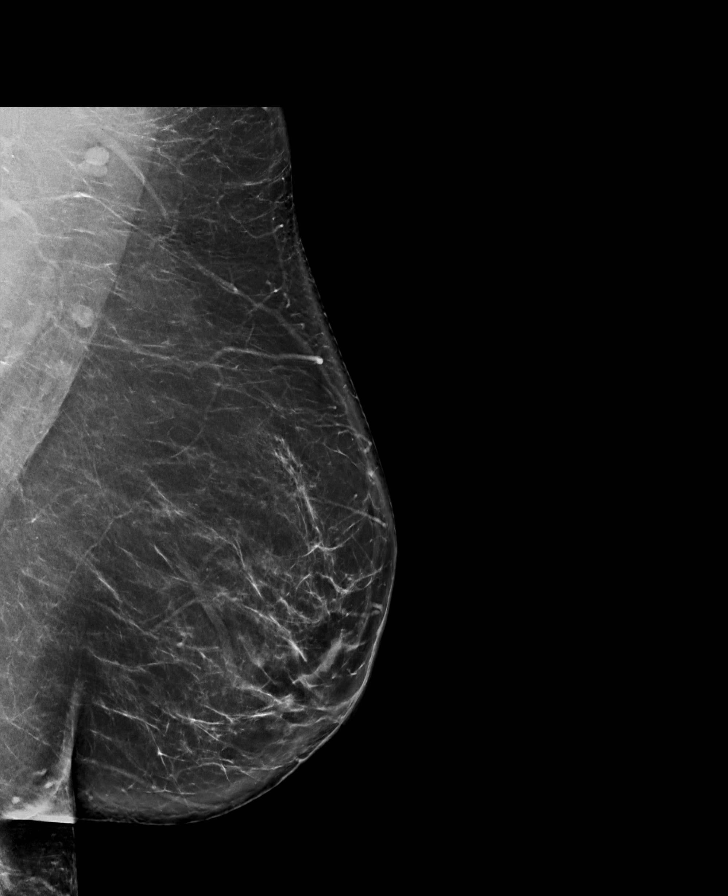

[L CC synth-2D]
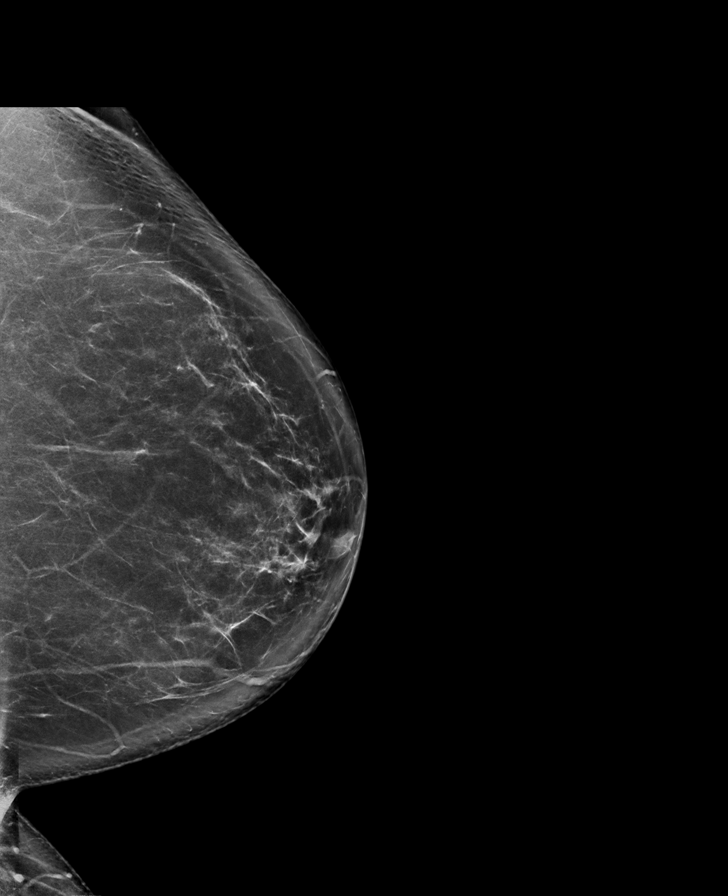

[L CC tomo · tomo slice 47/92.0]
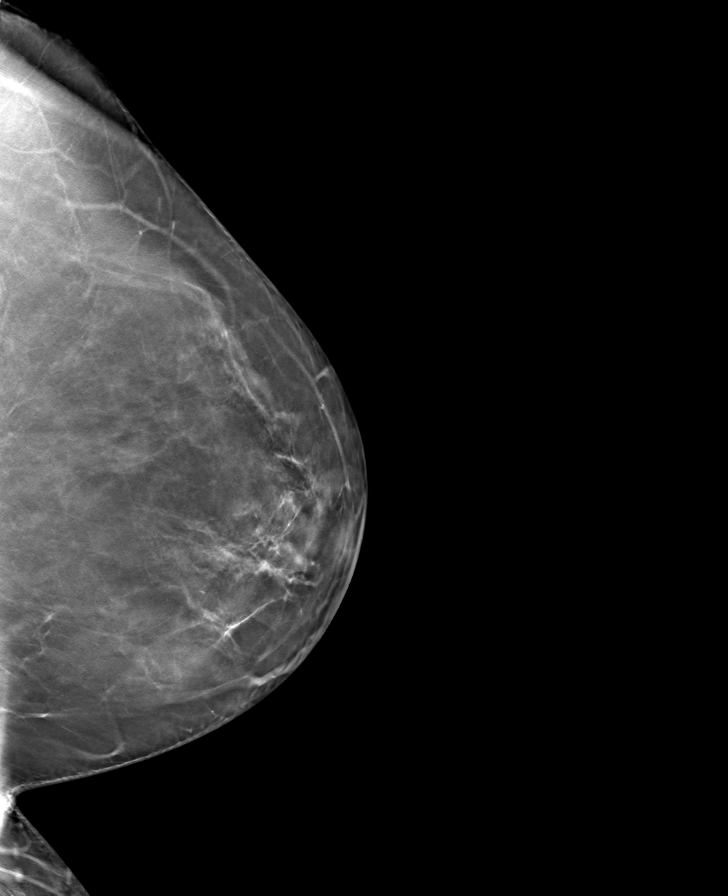

[R CC tomo · tomo slice 47/93.0]
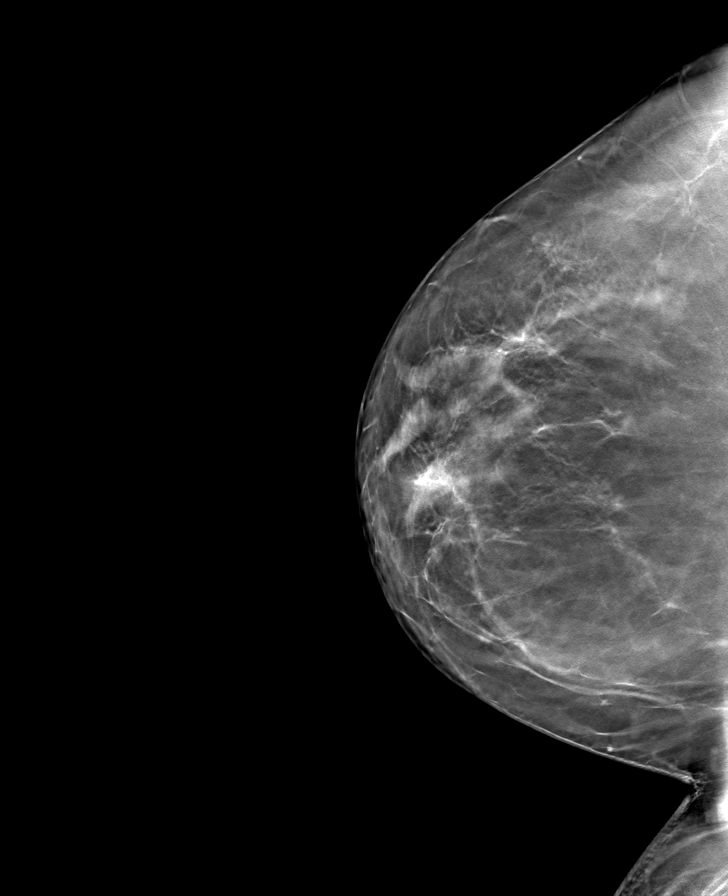

[L MLO tomo · tomo slice 46/91.0]
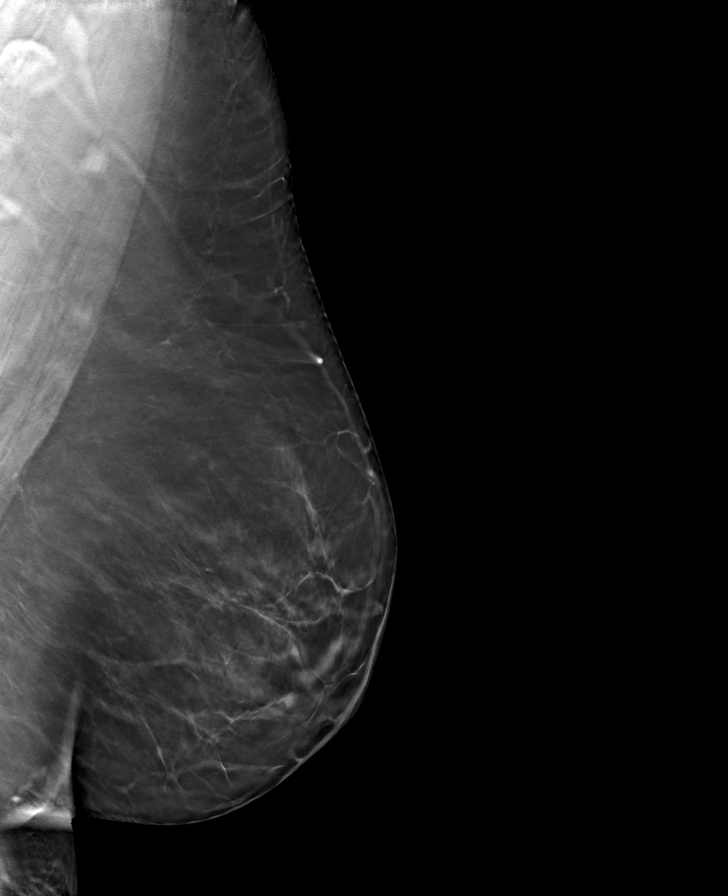

[R MLO tomo · tomo slice 45/89.0]
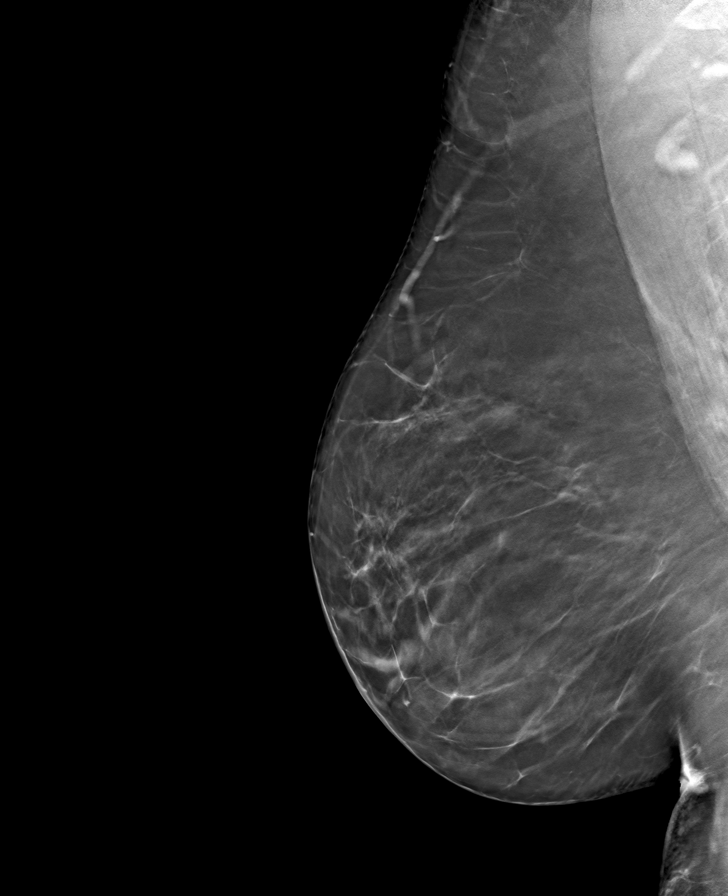

[8 of 24 positions shown; findings below may reference images not displayed]

ACR Breast Density Category b: There are scattered areas of
fibroglandular density.
FINDINGS: There are no findings suspicious for malignancy. Images were
processed with CAD.
IMPRESSION: No mammographic evidence of malignancy. A result letter of this
screening mammogram will be mailed directly to the patient.

RECOMMENDATION:
Screening mammogram in one year. (Code:CN-U-775)

BI-RADS CATEGORY  1: Negative.

## 2022-08-06 ENCOUNTER — Ambulatory Visit
Admission: RE | Admit: 2022-08-06 | Discharge: 2022-08-06 | Disposition: A | Discharge status: Home / Self Care | Payer: BC Managed Care – PPO | Source: Ambulatory Visit | Attending: Primary Care | Admitting: Primary Care

## 2022-08-06 DIAGNOSIS — Z1231 Encounter for screening mammogram for malignant neoplasm of breast: Secondary | ICD-10-CM | POA: Insufficient documentation

## 2022-10-17 NOTE — Progress Notes (Signed)
I,Joseline E Rosas,acting as a scribe for Shirlee Latch, MD.,have documented all relevant documentation on the behalf of Shirlee Latch, MD,as directed by  Shirlee Latch, MD while in the presence of Shirlee Latch, MD.   New patient visit   Patient: [name]   DOB: March 05, 1966   57 y.o. Female  MRN: [id number] Visit Date: 10/18/2022  Today's healthcare provider: Shirlee Latch, MD   Chief Complaint  Patient presents with   Establish Care   Hypertension   Subjective    [name] is a 57 y.o. female who presents today as a new patient to establish care.   Encounter to Establish Care Patient presents to establish care  Introduced myself and my role as primary care physician  We reviewed patient's medical, surgical, and social history and medications as listed below    PMHX   Last annual physical: 07/2021   Tetanus: UTD-per patient  HTN Medications: Lisinopril 10 mg and Hydrochlorothiazide 25 mg. Blood pressure readings at home: not being checked regularly.    Social Hx  Tobacco use: None Alcohol Use : Yes -6 glasses of wine-social Illicit drug use: No    HPI  Discussed the use of AI scribe software for clinical note transcription with the patient, who gave verbal consent to proceed.  History of Present Illness   The patient, a self-described high-stress individual, presents with multiple health concerns. They report having had shingles three times, despite having been vaccinated. The patient also experiences occasional acne, which may be related to rosacea - also experience flushing in heat/sun, when drinking alcohol, and eating spicy foods. They have a history of a broken proximal R humerus, which still causes intermittent pain and occasional difficulty lifting their arm. The patient also has a history of a torn meniscus in their left knee, which still causes discomfort, particularly when climbing stairs. The patient reports snoring and  acknowledges the potential need for a sleep study but expresses hesitation about using a CPAP machine. They have a history of plantar fasciitis, for which they underwent surgery in 2018 and 2019, and report significant improvement since the surgeries. The patient also has hypertension, which is managed with lisinopril and hydrochlorothiazide. They are making lifestyle changes, including dietary modifications, in an attempt to improve their health.       Past Medical History:  Diagnosis Date   Achilles tendonitis, bilateral 10/24/2015   Anemia    Closed fracture of part of upper end of humerus 06/18/2015   Records received from 06/05/15  [facility]   Dr. Myra Rude saw her for this diagnosis  PT for RIGHT arm 4 weeks 1 x a week   Depression    H/O scarlet fever    Haglund's deformity of right heel 12/27/2016   Heart murmur    History of chicken pox    History of genital warts    Hypertension    Plantar fasciitis    Rash due to allergy    Wheat allergy.   Shingles    Past Surgical History:  Procedure Laterality Date   ABDOMINAL HYSTERECTOMY  05/21/2003   CERVIX REMOVAL     FOOT SURGERY Bilateral    2018   KNEE SURGERY Left    2015   VAGINAL DELIVERY     x2   Family Status  Relation Name Status   Mother mother Alive   Father step father Alive   MGM  Deceased   Neg Hx  (Not Specified)   Family History  Adopted: Yes  Problem Relation Age of Onset   Hyperlipidemia Mother    Hypertension Mother    Stroke Maternal Grandmother    Colon cancer Neg Hx    Esophageal cancer Neg Hx    Rectal cancer Neg Hx    Stomach cancer Neg Hx    Breast cancer Neg Hx    Social History   Socioeconomic History   Marital status: Married    Spouse name: Not on file   Number of children: 2   Years of education: 18   Highest education level: Master's degree (e.g., MA, MS, MEng, MEd, MSW, MBA)  Occupational History   Occupation: Psychologist, forensic  Tobacco Use   Smoking  status: Never   Smokeless tobacco: Never  Vaping Use   Vaping Use: Never used  Substance and Sexual Activity   Alcohol use: Yes    Alcohol/week: 4.0 - 6.0 standard drinks of alcohol    Types: 4 - 6 Glasses of wine per week    Comment: social   Drug use: No   Sexual activity: Yes    Partners: Male    Birth control/protection: Post-menopausal  Other Topics Concern   Not on file  Social History Narrative   Married.   2 children.   She teaches high school.   Enjoys Clinical cytogeneticist.    Social Determinants of Health   Financial Resource Strain: Low Risk  (10/18/2022)   Overall Financial Resource Strain (CARDIA)    Difficulty of Paying Living Expenses: Not hard at all  Food Insecurity: No Food Insecurity (10/18/2022)   Hunger Vital Sign    Worried About Running Out of Food in the Last Year: Never true    Ran Out of Food in the Last Year: Never true  Transportation Needs: No Transportation Needs (10/18/2022)   PRAPARE - Administrator, Civil Service (Medical): No    Lack of Transportation (Non-Medical): No  Physical Activity: Insufficiently Active (10/18/2022)   Exercise Vital Sign    Days of Exercise per Week: 5 days    Minutes of Exercise per Session: 20 min  Stress: No Stress Concern Present (10/18/2022)   [employer] of Occupational Health - Occupational Stress Questionnaire    Feeling of Stress : Only a little  Social Connections: Moderately Isolated (10/18/2022)   Social Connection and Isolation Panel [NHANES]    Frequency of Communication with Friends and Family: More than three times a week    Frequency of Social Gatherings with Friends and Family: Twice a week    Attends Religious Services: Never    Database administrator or Organizations: No    Attends Engineer, structural: Not on file    Marital Status: Married   Outpatient Medications Prior to Visit  Medication Sig   cetirizine (ZYRTEC) 10 MG tablet Take 10 mg by mouth daily.   vitamin D3  (CHOLECALCIFEROL) 25 MCG tablet Take 5,000 Units by mouth daily.   aspirin 81 MG tablet Take 81 mg by mouth daily.   TURMERIC PO Take 1,200 mg by mouth daily.   [DISCONTINUED] hydrochlorothiazide (HYDRODIURIL) 25 MG tablet Take 1 tablet (25 mg total) by mouth daily. for blood pressure.   [DISCONTINUED] lisinopril (ZESTRIL) 10 MG tablet Take 1 tablet by mouth daily for blood pressure.   [DISCONTINUED] loratadine (CLARITIN) 10 MG tablet Take 10 mg by mouth daily. Once a day   No facility-administered medications prior to visit.   Allergies  Allergen Reactions   Wheat Rash  Can't eat wheat or have wheat as an ingredient    Immunization History  Administered Date(s) Administered   Covid-19, Mrna,Vaccine(Spikevax)24yrs and older 03/24/2022   Influenza, Seasonal, Injecte, Preservative Fre 02/24/2018   Influenza,inj,Quad PF,6+ Mos 03/10/2017, 03/09/2019   Influenza-Unspecified 05/24/2016, 02/02/2020, 03/21/2022   Moderna Covid-19 Vaccine Bivalent Booster 14yrs & up 12/12/2020, 02/23/2021   PFIZER(Purple Top)SARS-COV-2 Vaccination 07/16/2019, 08/06/2019, 02/12/2020   Tdap 05/20/2012   Zoster Recombinat (Shingrix) 04/28/2020, 07/26/2020    Health Maintenance  Topic Date Due   DTaP/Tdap/Td (2 - Td or Tdap) 05/20/2022   INFLUENZA VACCINE  12/19/2022   MAMMOGRAM  08/05/2024   Colonoscopy  12/03/2025   COVID-19 Vaccine  Completed   Hepatitis C Screening  Completed   HIV Screening  Completed   Zoster Vaccines- Shingrix  Completed   HPV VACCINES  Aged Out   PAP SMEAR-Modifier  Discontinued    Patient Care Team: Erasmo Downer, MD as PCP - General (Family Medicine)  Review of Systems  All other systems reviewed and are negative.      Objective    BP 125/84 (BP Location: Left Arm, Patient Position: Sitting, Cuff Size: Large)   Pulse 80   Temp 98 F (36.7 C)   Resp 16   Ht 5\' 8"  (1.727 m)   Wt 237 lb (107.5 kg)   BMI 36.04 kg/m    Physical Exam Vitals reviewed.   Constitutional:      General: She is not in acute distress.    Appearance: Normal appearance. She is well-developed. She is not diaphoretic.  HENT:     Head: Normocephalic and atraumatic.  Eyes:     General: No scleral icterus.    Conjunctiva/sclera: Conjunctivae normal.  Neck:     Thyroid: No thyromegaly.  Cardiovascular:     Rate and Rhythm: Normal rate and regular rhythm.     Heart sounds: Normal heart sounds. No murmur heard. Pulmonary:     Effort: Pulmonary effort is normal. No respiratory distress.     Breath sounds: Normal breath sounds. No wheezing, rhonchi or rales.  Musculoskeletal:     Cervical back: Neck supple.     Right lower leg: No edema.     Left lower leg: No edema.  Lymphadenopathy:     Cervical: No cervical adenopathy.  Skin:    General: Skin is warm and dry.     Findings: No rash.  Neurological:     Mental Status: She is alert and oriented to person, place, and time. Mental status is at baseline.  Psychiatric:        Mood and Affect: Mood normal.        Behavior: Behavior normal.      Depression Screen    10/18/2022   10:11 AM 09/07/2021   11:56 AM  PHQ 2/9 Scores  PHQ - 2 Score 1 1  PHQ- 9 Score 4    No results found for any visits on 10/18/22.  Assessment & Plan      Problem List Items Addressed This Visit       Cardiovascular and Mediastinum   Essential hypertension - Primary    Hypertension: Well-controlled on Lisinopril 10mg  and Hydrochlorothiazide 25mg  daily. -Continue current medications.      Relevant Medications   lisinopril (ZESTRIL) 10 MG tablet   hydrochlorothiazide (HYDRODIURIL) 25 MG tablet   Other Relevant Orders   Comprehensive metabolic panel     Other   Iron deficiency anemia    Asymptomatic H/o Recheck CBC and  iron panel      Relevant Orders   Iron, TIBC and Ferritin Panel   CBC with Differential/Platelet   Obesity (BMI 30.0-34.9)    Discussed importance of healthy weight management Discussed diet and  exercise       Hyperlipidemia    Reviewed last lipid panel Not currently on a statin Recheck FLP and CMP Discussed diet and exercise       Relevant Medications   lisinopril (ZESTRIL) 10 MG tablet   hydrochlorothiazide (HYDRODIURIL) 25 MG tablet   Other Relevant Orders   Comprehensive metabolic panel   Lipid panel   Prediabetes    Recommend low carb diet Recheck A1c       Relevant Orders   Hemoglobin A1c   Avitaminosis D    Continue supplement Recheck level       Relevant Orders   VITAMIN D 25 Hydroxy (Vit-D Deficiency, Fractures)   Rosacea    Flushing and acne-like bumps, likely triggered by heat, spicy foods, and alcohol. -Advise on environmental factors such as wearing sunscreen and avoiding triggers.      Snoring    Snoring: Possible sleep apnea, but patient is hesitant to undergo a sleep study and use CPAP. -Discussed the risks of untreated sleep apnea. Patient to consider lifestyle modifications before considering a sleep study.      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 Stress identified as a potential trigger. -Provide a prescription for Valtrex to be held at the pharmacy for immediate use at the onset of future episodes.  Right Shoulder Pain: Intermittent pain and limited range of motion following a fracture six years ago. -Advise on over-the-counter pain management as needed.  Left Knee Pain: Persistent pain following a meniscus tear and surgery years ago. -Suspect post-traumatic arthritis. Advise on over-the-counter pain management as needed.  General Health Maintenance: -Order labs to check kidney and liver function, blood counts, blood sugar, and vitamin D levels. Patient to return fasting for these labs. -Schedule follow-up visit in six months to monitor blood pressure and discuss lab results.        Return in about 6 months (around  04/19/2023) for CPE.     I, Shirlee Latch, MD, have reviewed all documentation for this visit. The documentation on 10/18/22 for the exam, diagnosis, procedures, and orders are all accurate and complete.   Shevy Yaney, Marzella Schlein, MD, MPH [facility]

## 2022-10-18 ENCOUNTER — Encounter: Payer: Self-pay | Admitting: Family Medicine

## 2022-10-18 ENCOUNTER — Ambulatory Visit: Payer: BC Managed Care – PPO | Admitting: Family Medicine

## 2022-10-18 VITALS — BP 125/84 | HR 80 | Temp 98.0°F | Resp 16 | Ht 68.0 in | Wt 237.0 lb

## 2022-10-18 DIAGNOSIS — I10 Essential (primary) hypertension: Secondary | ICD-10-CM | POA: Diagnosis not present

## 2022-10-18 DIAGNOSIS — E785 Hyperlipidemia, unspecified: Secondary | ICD-10-CM | POA: Diagnosis not present

## 2022-10-18 DIAGNOSIS — E559 Vitamin D deficiency, unspecified: Secondary | ICD-10-CM | POA: Insufficient documentation

## 2022-10-18 DIAGNOSIS — R0683 Snoring: Secondary | ICD-10-CM | POA: Insufficient documentation

## 2022-10-18 DIAGNOSIS — E669 Obesity, unspecified: Secondary | ICD-10-CM

## 2022-10-18 DIAGNOSIS — R7303 Prediabetes: Secondary | ICD-10-CM | POA: Diagnosis not present

## 2022-10-18 DIAGNOSIS — L719 Rosacea, unspecified: Secondary | ICD-10-CM | POA: Insufficient documentation

## 2022-10-18 DIAGNOSIS — Z8781 Personal history of (healed) traumatic fracture: Secondary | ICD-10-CM

## 2022-10-18 DIAGNOSIS — D509 Iron deficiency anemia, unspecified: Secondary | ICD-10-CM

## 2022-10-18 MED ORDER — HYDROCHLOROTHIAZIDE 25 MG PO TABS: 25.0000 mg | ORAL_TABLET | Freq: Every day | ORAL | 3 refills | Status: DC

## 2022-10-18 MED ORDER — LISINOPRIL 10 MG PO TABS: ORAL_TABLET | ORAL | 3 refills | Status: DC

## 2022-10-18 MED ORDER — VALACYCLOVIR HCL 1 G PO TABS: 1000.0000 mg | ORAL_TABLET | Freq: Three times a day (TID) | ORAL | 0 refills | Status: AC

## 2022-10-18 NOTE — Assessment & Plan Note (Signed)
Continue supplement Recheck level 

## 2022-10-18 NOTE — Assessment & Plan Note (Signed)
Reviewed last lipid panel Not currently on a statin Recheck FLP and CMP Discussed diet and exercise  

## 2022-10-18 NOTE — Assessment & Plan Note (Signed)
Flushing and acne-like bumps, likely triggered by heat, spicy foods, and alcohol. -Advise on environmental factors such as wearing sunscreen and avoiding triggers.

## 2022-10-18 NOTE — Assessment & Plan Note (Signed)
Snoring: Possible sleep apnea, but patient is hesitant to undergo a sleep study and use CPAP. -Discussed the risks of untreated sleep apnea. Patient to consider lifestyle modifications before considering a sleep study.

## 2022-10-18 NOTE — Assessment & Plan Note (Signed)
Asymptomatic H/o Recheck CBC and iron panel

## 2022-10-18 NOTE — Assessment & Plan Note (Signed)
Hypertension: Well-controlled on Lisinopril 10mg  and Hydrochlorothiazide 25mg  daily. -Continue current medications.

## 2022-10-18 NOTE — Assessment & Plan Note (Signed)
Discussed importance of healthy weight management Discussed diet and exercise  

## 2022-10-18 NOTE — Assessment & Plan Note (Signed)
Recommend low carb diet °Recheck A1c  °

## 2022-11-02 LAB — COMPREHENSIVE METABOLIC PANEL
ALT: 26 IU/L (ref 0–32)
AST: 22 IU/L (ref 0–40)
Albumin/Globulin Ratio: 1.7
Albumin: 4.1 g/dL (ref 3.8–4.9)
Alkaline Phosphatase: 98 IU/L (ref 44–121)
BUN/Creatinine Ratio: 19 (ref 9–23)
BUN: 13 mg/dL (ref 6–24)
Bilirubin Total: <0.2 mg/dL (ref 0.0–1.2)
CO2: 22 mmol/L (ref 20–29)
Calcium: 8.7 mg/dL (ref 8.7–10.2)
Chloride: 103 mmol/L (ref 96–106)
Creatinine, Ser: 0.7 mg/dL (ref 0.57–1.00)
Globulin, Total: 2.4 g/dL (ref 1.5–4.5)
Glucose: 99 mg/dL (ref 70–99)
Potassium: 4.4 mmol/L (ref 3.5–5.2)
Sodium: 139 mmol/L (ref 134–144)
Total Protein: 6.5 g/dL (ref 6.0–8.5)
eGFR: 100 mL/min/{1.73_m2} (ref >59)

## 2022-11-02 LAB — CBC WITH DIFFERENTIAL/PLATELET
Basophils Absolute: 0 10*3/uL (ref 0.0–0.2)
Basos: 0 %
EOS (ABSOLUTE): 0.1 10*3/uL (ref 0.0–0.4)
Eos: 2 %
Hematocrit: 32.9 % — ABNORMAL LOW (ref 34.0–46.6)
Hemoglobin: 10 g/dL — ABNORMAL LOW (ref 11.1–15.9)
Immature Grans (Abs): 0 10*3/uL (ref 0.0–0.1)
Immature Granulocytes: 1 %
Lymphocytes Absolute: 2.1 10*3/uL (ref 0.7–3.1)
Lymphs: 29 %
MCH: 23.6 pg — ABNORMAL LOW (ref 26.6–33.0)
MCHC: 30.4 g/dL — ABNORMAL LOW (ref 31.5–35.7)
MCV: 78 fL — ABNORMAL LOW (ref 79–97)
Monocytes Absolute: 0.7 10*3/uL (ref 0.1–0.9)
Monocytes: 10 %
Neutrophils Absolute: 4.3 10*3/uL (ref 1.4–7.0)
Neutrophils: 58 %
Platelets: 368 10*3/uL (ref 150–450)
RBC: 4.24 x10E6/uL (ref 3.77–5.28)
RDW: 16.1 % — ABNORMAL HIGH (ref 11.7–15.4)
WBC: 7.3 10*3/uL (ref 3.4–10.8)

## 2022-11-02 LAB — LIPID PANEL
Chol/HDL Ratio: 3.9 ratio (ref 0.0–4.4)
Cholesterol, Total: 185 mg/dL (ref 100–199)
HDL: 48 mg/dL (ref >39)
LDL Chol Calc (NIH): 118 mg/dL — ABNORMAL HIGH (ref 0–99)
Triglycerides: 106 mg/dL (ref 0–149)
VLDL Cholesterol Cal: 19 mg/dL (ref 5–40)

## 2022-11-02 LAB — IRON,TIBC AND FERRITIN PANEL
Ferritin: 13 ng/mL — ABNORMAL LOW (ref 15–150)
Iron Saturation: 5 % — CL (ref 15–55)
Iron: 20 ug/dL — ABNORMAL LOW (ref 27–159)
Total Iron Binding Capacity: 376 ug/dL (ref 250–450)
UIBC: 356 ug/dL (ref 131–425)

## 2022-11-02 LAB — HEMOGLOBIN A1C
Est. average glucose Bld gHb Est-mCnc: 126 mg/dL
Hgb A1c MFr Bld: 6 % — ABNORMAL HIGH (ref 4.8–5.6)

## 2022-11-02 LAB — VITAMIN D 25 HYDROXY (VIT D DEFICIENCY, FRACTURES): Vit D, 25-Hydroxy: 68.9 ng/mL (ref 30.0–100.0)

## 2022-11-05 ENCOUNTER — Other Ambulatory Visit: Payer: Self-pay | Admitting: Physician Assistant

## 2022-11-05 DIAGNOSIS — D509 Iron deficiency anemia, unspecified: Secondary | ICD-10-CM

## 2022-12-18 ENCOUNTER — Other Ambulatory Visit: Payer: Self-pay | Admitting: Physician Assistant

## 2023-04-21 ENCOUNTER — Encounter: Payer: BC Managed Care – PPO | Admitting: Family Medicine

## 2023-06-09 ENCOUNTER — Ambulatory Visit (INDEPENDENT_AMBULATORY_CARE_PROVIDER_SITE_OTHER): Payer: 59 | Admitting: Family Medicine

## 2023-06-09 ENCOUNTER — Encounter: Payer: Self-pay | Admitting: Family Medicine

## 2023-06-09 VITALS — BP 121/67 | HR 85 | Resp 18 | Ht 68.0 in | Wt 230.2 lb

## 2023-06-09 DIAGNOSIS — R0683 Snoring: Secondary | ICD-10-CM | POA: Diagnosis not present

## 2023-06-09 DIAGNOSIS — D509 Iron deficiency anemia, unspecified: Secondary | ICD-10-CM

## 2023-06-09 DIAGNOSIS — K921 Melena: Secondary | ICD-10-CM

## 2023-06-09 DIAGNOSIS — Z0000 Encounter for general adult medical examination without abnormal findings: Secondary | ICD-10-CM

## 2023-06-09 DIAGNOSIS — Z1231 Encounter for screening mammogram for malignant neoplasm of breast: Secondary | ICD-10-CM

## 2023-06-09 DIAGNOSIS — J329 Chronic sinusitis, unspecified: Secondary | ICD-10-CM

## 2023-06-09 DIAGNOSIS — R7303 Prediabetes: Secondary | ICD-10-CM | POA: Diagnosis not present

## 2023-06-09 DIAGNOSIS — I10 Essential (primary) hypertension: Secondary | ICD-10-CM | POA: Diagnosis not present

## 2023-06-09 DIAGNOSIS — E785 Hyperlipidemia, unspecified: Secondary | ICD-10-CM

## 2023-06-09 MED ORDER — AMOXICILLIN-POT CLAVULANATE 875-125 MG PO TABS: 1.0000 | ORAL_TABLET | Freq: Two times a day (BID) | ORAL | 0 refills | Status: AC

## 2023-06-09 NOTE — Assessment & Plan Note (Signed)
Acute sinus infection affecting the left cheek with significant mucus production, sore throat, and nausea. Physical exam reveals left-sided congestion and tenderness. Discussed Augmentin as first-line treatment for bacterial sinus infections. - Prescribe Augmentin BID x7d - Send prescription to [facility] at [facility]

## 2023-06-09 NOTE — Assessment & Plan Note (Signed)
Reviewed last lipid panel Not currently on a statin Recheck FLP and CMP Discussed diet and exercise  

## 2023-06-09 NOTE — Assessment & Plan Note (Signed)
Iron deficiency anemia with previous low iron levels. Reports improved energy with dietary changes, including increased red meat consumption. Discussed investigating potential sources of blood loss, including the colon. - Order blood tests including cholesterol, kidney and liver function, A1c, blood count, and iron panel - Refer to GI for evaluation of blood in stool and potential colonoscopy

## 2023-06-09 NOTE — Assessment & Plan Note (Signed)
Well-controlled hypertension on hydrochlorothiazide 25 mg and lisinopril 10 mg daily with no reported issues. - Continue hydrochlorothiazide 25 mg daily - Continue lisinopril 10 mg daily

## 2023-06-09 NOTE — Assessment & Plan Note (Signed)
Suspected obstructive sleep apnea. Reports improved sleep with a tongue retention device. Considering a home sleep study to evaluate the device's effectiveness and potential need for CPAP. Discussed oral appliance if the device is effective, noting insurance may require a CPAP trial first. Home sleep studies are now more accurate and convenient.  - Order home sleep study with instructions to use the tongue retention device on some nights and not on others - Refer to ENT for evaluation of sinus issues and potential intervention for possible OSA - Administer Epworth Sleepiness Scale questionnaire

## 2023-06-09 NOTE — Assessment & Plan Note (Signed)
Prediabetes with previous elevated A1c. Last A1c was taken post-vacation, which may have affected results. - Order A1c test

## 2023-06-09 NOTE — Progress Notes (Signed)
Complete physical exam   Patient: [name]   DOB: December 04, 1965   58 y.o. Female  MRN: [id number] Visit Date: 06/09/2023  Today's healthcare provider: Shirlee Latch, MD   Chief Complaint  Patient presents with   Annual Exam   Subjective    [name] is a 58 y.o. female who presents today for a complete physical exam.  She reports consuming a general diet.  She generally feels well. She reports sleeping fairly well. She does have additional problems to discuss today.    Discussed the use of AI scribe software for clinical note transcription with the patient, who gave verbal consent to proceed.  History of Present Illness   The patient, with a history of hypertension managed with hydrochlorothiazide 25mg  and lisinopril 10mg  daily, presents with sinus congestion. She reports a history of sinus infections, with the most recent episode beginning last week. The patient describes the current episode as a head cold that has settled into the left cheek, causing jaw pain and significant mucus production. She also reports a recent occurrence of blood in stool following constipation, which has happened twice.  The patient has been experiencing sleep issues and is considering a home sleep study. She has been using a tongue retention device to manage snoring, which has reportedly improved her sleep quality and energy levels. The patient's spouse has confirmed that the device has eliminated snoring. The patient is interested in exploring the possibility of a fitted oral appliance as an alternative to a CPAP machine, as she also experiences teeth grinding during sleep.        Last depression screening scores    06/09/2023   11:01 AM 10/18/2022   10:11 AM 09/07/2021   11:56 AM  PHQ 2/9 Scores  PHQ - 2 Score 0 1 1  PHQ- 9 Score 1 4    Last fall risk screening    06/09/2023   11:01 AM  Fall Risk   Falls in the past year? 0  Number falls in past yr: 0  Injury with Fall? 0          Medications: Outpatient Medications Prior to Visit  Medication Sig   aspirin 81 MG tablet Take 81 mg by mouth daily.   cetirizine (ZYRTEC) 10 MG tablet Take 10 mg by mouth daily.   hydrochlorothiazide (HYDRODIURIL) 25 MG tablet Take 1 tablet (25 mg total) by mouth daily. for blood pressure.   lisinopril (ZESTRIL) 10 MG tablet Take 1 tablet by mouth daily for blood pressure.   TURMERIC PO Take 1,200 mg by mouth daily.   vitamin D3 (CHOLECALCIFEROL) 25 MCG tablet Take 5,000 Units by mouth daily.   No facility-administered medications prior to visit.    Review of Systems    Objective    BP 121/67   Pulse 85   Resp 18   Ht 5\' 8"  (1.727 m)   Wt 230 lb 3.2 oz (104.4 kg)   SpO2 100%   BMI 35.00 kg/m    Physical Exam Vitals reviewed.  Constitutional:      General: She is not in acute distress.    Appearance: Normal appearance. She is well-developed. She is not diaphoretic.  HENT:     Head: Normocephalic and atraumatic.     Right Ear: Tympanic membrane, ear canal and external ear normal.     Left Ear: Tympanic membrane, ear canal and external ear normal.     Nose: Nose normal.     Mouth/Throat:  Mouth: Mucous membranes are moist.     Pharynx: Oropharynx is clear. No oropharyngeal exudate.  Eyes:     General: No scleral icterus.    Conjunctiva/sclera: Conjunctivae normal.     Pupils: Pupils are equal, round, and reactive to light.  Neck:     Thyroid: No thyromegaly.  Cardiovascular:     Rate and Rhythm: Normal rate and regular rhythm.     Heart sounds: Normal heart sounds. No murmur heard. Pulmonary:     Effort: Pulmonary effort is normal. No respiratory distress.     Breath sounds: Normal breath sounds. No wheezing or rales.  Abdominal:     General: There is no distension.     Palpations: Abdomen is soft.     Tenderness: There is no abdominal tenderness.  Musculoskeletal:        General: No deformity.     Cervical back: Neck supple.     Right  lower leg: No edema.     Left lower leg: No edema.  Lymphadenopathy:     Cervical: No cervical adenopathy.  Skin:    General: Skin is warm and dry.     Findings: No rash.  Neurological:     Mental Status: She is alert and oriented to person, place, and time. Mental status is at baseline.     Gait: Gait normal.  Psychiatric:        Mood and Affect: Mood normal.        Behavior: Behavior normal.        Thought Content: Thought content normal.     Physical Exam   HEENT: Ears normal. Throat normal. Nasal congestion noted on the left side. Sinus tenderness on L side.     No results found for any visits on 06/09/23.  Assessment & Plan    Routine Health Maintenance and Physical Exam  Exercise Activities and Dietary recommendations  Goals   None     Immunization History  Administered Date(s) Administered   Influenza, Seasonal, Injecte, Preservative Fre 02/24/2018   Influenza,inj,Quad PF,6+ Mos 03/10/2017, 03/09/2019   Influenza-Unspecified 05/24/2016, 02/02/2020, 03/21/2022, 03/04/2023   Moderna Covid-19 Fall Seasonal Vaccine 92yrs & older 03/24/2022   Moderna Covid-19 Vaccine Bivalent Booster 27yrs & up 12/12/2020, 02/23/2021   PFIZER(Purple Top)SARS-COV-2 Vaccination 07/16/2019, 08/06/2019, 02/12/2020   Tdap 05/20/2012   Zoster Recombinant(Shingrix) 04/28/2020, 07/26/2020    Health Maintenance  Topic Date Due   DTaP/Tdap/Td (2 - Td or Tdap) 05/20/2022   COVID-19 Vaccine (7 - 2024-25 season) 01/19/2023   MAMMOGRAM  08/05/2024   Colonoscopy  12/03/2025   INFLUENZA VACCINE  Completed   Hepatitis C Screening  Completed   HIV Screening  Completed   Zoster Vaccines- Shingrix  Completed   HPV VACCINES  Aged Out    Discussed health benefits of physical activity, and encouraged her to engage in regular exercise appropriate for her age and condition.  Problem List Items Addressed This Visit       Cardiovascular and Mediastinum   Essential hypertension   Well-controlled  hypertension on hydrochlorothiazide 25 mg and lisinopril 10 mg daily with no reported issues. - Continue hydrochlorothiazide 25 mg daily - Continue lisinopril 10 mg daily        Respiratory   Chronic sinusitis   Acute sinus infection affecting the left cheek with significant mucus production, sore throat, and nausea. Physical exam reveals left-sided congestion and tenderness. Discussed Augmentin as first-line treatment for bacterial sinus infections. - Prescribe Augmentin BID x7d - Send prescription to [facility]  at [facility]      Relevant Medications   amoxicillin-clavulanate (AUGMENTIN) 875-125 MG tablet   Other Relevant Orders   Ambulatory referral to ENT     Other   Iron deficiency anemia   Iron deficiency anemia with previous low iron levels. Reports improved energy with dietary changes, including increased red meat consumption. Discussed investigating potential sources of blood loss, including the colon. - Order blood tests including cholesterol, kidney and liver function, A1c, blood count, and iron panel - Refer to GI for evaluation of blood in stool and potential colonoscopy      Relevant Orders   Iron, TIBC and Ferritin Panel   CBC with Differential/Platelet   Ambulatory referral to Gastroenterology   Hyperlipidemia   Reviewed last lipid panel Not currently on a statin Recheck FLP and CMP Discussed diet and exercise       Relevant Orders   Comprehensive metabolic panel   Lipid panel   Prediabetes   Prediabetes with previous elevated A1c. Last A1c was taken post-vacation, which may have affected results. - Order A1c test      Relevant Orders   Hemoglobin A1c   Snoring   Suspected obstructive sleep apnea. Reports improved sleep with a tongue retention device. Considering a home sleep study to evaluate the device's effectiveness and potential need for CPAP. Discussed oral appliance if the device is effective, noting insurance may require a CPAP trial first. Home  sleep studies are now more accurate and convenient.  - Order home sleep study with instructions to use the tongue retention device on some nights and not on others - Refer to ENT for evaluation of sinus issues and potential intervention for possible OSA - Administer Epworth Sleepiness Scale questionnaire      Relevant Orders   Ambulatory referral to Sleep Studies   Ambulatory referral to ENT   Other Visit Diagnoses       Encounter for annual physical exam    -  Primary     Blood in stool       Relevant Orders   Ambulatory referral to Gastroenterology     Breast cancer screening by mammogram       Relevant Orders   MM 3D SCREENING MAMMOGRAM BILATERAL BREAST          General Health Maintenance Routine health maintenance. Received flu shot in October, COVID booster in the fall, and Tdap recently. Colonoscopy is up to date until 2027. Mammogram due in March. - Update chart with Tdap vaccination from [facility] in [place] - Order mammogram for March  Follow-up - Schedule follow-up appointment after sleep study results - Schedule ENT appointment - Schedule GI appointment - Schedule mammogram in March - Complete Epworth Sleepiness Scale questionnaire before leaving.          Return in about 6 months (around 12/07/2023) for chronic disease f/u.     Shirlee Latch, MD  [facility] [phone] (phone) [phone] (fax)  [facility]

## 2023-06-10 ENCOUNTER — Encounter: Payer: Self-pay | Admitting: Family Medicine

## 2023-06-10 LAB — COMPREHENSIVE METABOLIC PANEL
ALT: 25 [IU]/L (ref 0–32)
AST: 24 [IU]/L (ref 0–40)
Albumin: 4.2 g/dL (ref 3.8–4.9)
Alkaline Phosphatase: 98 [IU]/L (ref 44–121)
BUN/Creatinine Ratio: 14 (ref 9–23)
BUN: 11 mg/dL (ref 6–24)
Bilirubin Total: 0.2 mg/dL (ref 0.0–1.2)
CO2: 26 mmol/L (ref 20–29)
Calcium: 9.4 mg/dL (ref 8.7–10.2)
Chloride: 97 mmol/L (ref 96–106)
Creatinine, Ser: 0.76 mg/dL (ref 0.57–1.00)
Globulin, Total: 2.6 g/dL (ref 1.5–4.5)
Glucose: 93 mg/dL (ref 70–99)
Potassium: 4.2 mmol/L (ref 3.5–5.2)
Sodium: 138 mmol/L (ref 134–144)
Total Protein: 6.8 g/dL (ref 6.0–8.5)
eGFR: 91 mL/min/{1.73_m2} (ref >59)

## 2023-06-10 LAB — CBC WITH DIFFERENTIAL/PLATELET
Basophils Absolute: 0 10*3/uL (ref 0.0–0.2)
Basos: 1 %
EOS (ABSOLUTE): 0.3 10*3/uL (ref 0.0–0.4)
Eos: 4 %
Hematocrit: 35.4 % (ref 34.0–46.6)
Hemoglobin: 11.3 g/dL (ref 11.1–15.9)
Immature Grans (Abs): 0 10*3/uL (ref 0.0–0.1)
Immature Granulocytes: 0 %
Lymphocytes Absolute: 2.5 10*3/uL (ref 0.7–3.1)
Lymphs: 34 %
MCH: 26.1 pg — ABNORMAL LOW (ref 26.6–33.0)
MCHC: 31.9 g/dL (ref 31.5–35.7)
MCV: 82 fL (ref 79–97)
Monocytes Absolute: 0.7 10*3/uL (ref 0.1–0.9)
Monocytes: 10 %
Neutrophils Absolute: 4 10*3/uL (ref 1.4–7.0)
Neutrophils: 51 %
Platelets: 370 10*3/uL (ref 150–450)
RBC: 4.33 x10E6/uL (ref 3.77–5.28)
RDW: 13.7 % (ref 11.7–15.4)
WBC: 7.6 10*3/uL (ref 3.4–10.8)

## 2023-06-10 LAB — LIPID PANEL
Chol/HDL Ratio: 5.3 {ratio} — ABNORMAL HIGH (ref 0.0–4.4)
Cholesterol, Total: 197 mg/dL (ref 100–199)
HDL: 37 mg/dL — ABNORMAL LOW (ref >39)
LDL Chol Calc (NIH): 130 mg/dL — ABNORMAL HIGH (ref 0–99)
Triglycerides: 167 mg/dL — ABNORMAL HIGH (ref 0–149)
VLDL Cholesterol Cal: 30 mg/dL (ref 5–40)

## 2023-06-10 LAB — IRON,TIBC AND FERRITIN PANEL
Ferritin: 28 ng/mL (ref 15–150)
Iron Saturation: 11 % — ABNORMAL LOW (ref 15–55)
Iron: 41 ug/dL (ref 27–159)
Total Iron Binding Capacity: 370 ug/dL (ref 250–450)
UIBC: 329 ug/dL (ref 131–425)

## 2023-06-10 LAB — HEMOGLOBIN A1C
Est. average glucose Bld gHb Est-mCnc: 128 mg/dL
Hgb A1c MFr Bld: 6.1 % — ABNORMAL HIGH (ref 4.8–5.6)

## 2023-06-16 ENCOUNTER — Encounter: Payer: Self-pay | Admitting: Family Medicine

## 2023-06-16 DIAGNOSIS — J329 Chronic sinusitis, unspecified: Secondary | ICD-10-CM

## 2023-06-18 ENCOUNTER — Telehealth: Payer: 59 | Admitting: Family Medicine

## 2023-06-18 ENCOUNTER — Ambulatory Visit: Payer: Self-pay

## 2023-06-18 DIAGNOSIS — B9689 Other specified bacterial agents as the cause of diseases classified elsewhere: Secondary | ICD-10-CM

## 2023-06-18 NOTE — Telephone Encounter (Signed)
Summary: Rx completed - still having symptoms   Pt states she still has severe sinus infection symptoms, pt seen for physical on 06/09/2023. Pt states she sent mychart message on 06/16/2023 and has not heard back. Pt finished course of antibiotics on Monday and has not had much improve       Left message to call back about symptoms.

## 2023-06-18 NOTE — Telephone Encounter (Signed)
Reason for Disposition  [1] Taking antibiotic > 7 days AND [2] nasal discharge not improved  Answer Assessment - Initial Assessment Questions 1. ANTIBIOTIC: "What antibiotic are you taking?" "How many times a day?"     Antibiotic has not helped with the sinus infection.   I saw Dr. Beryle Flock on 06/09/2023 during my physical and she started me on an antibiotic for a sinus infection.   My left side of my face and sinus is congested still.    No chills or body aches.  I'm very tired.  2. ONSET: "When was the antibiotic started?"     1/20/205.    I'm supposed to fly to [place] on Friday.   I'm concerned about flying with all this congestion still going on.   If I take Sudafed and Mucinex it helps some.   The Sudafed drys me up and the Mucinex helps to thin the secretions from where it gets too dry.  I'm drinking a lot of liquids and taking vitamin C.   I take Benadryl at night which helps me sleep.   I can't take this during the day because it makes me too sleepy.   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   Can  3. PAIN: "How bad is the sinus pain?"   (Scale 1-10; mild, moderate or severe)   - MILD (1-3): doesn't interfere with normal activities    - MODERATE (4-7): interferes with normal activities (e.g., work or school) or awakens from sleep   - SEVERE (8-10): excruciating pain and patient unable to do any normal activities        Moderate on left side of face and sinus. 4. FEVER: "Do you have a fever?" If Yes, ask: "What is it, how was it measured, and when did it start?"      I don't think so 5. SYMPTOMS: "Are there any other symptoms you're concerned about?" If Yes, ask: "When did it start?"     See above 6. PREGNANCY: "Is there any chance you are pregnant?" "When was your last menstrual period?"     Not asked  Protocols used: Sinus Infection on Antibiotic Follow-up Call-A-AH  Chief Complaint: Not much improved after completing the antibiotic for sinus infection.   Seen on 1/20 by Dr.  Beryle Flock.   She is flying out to [place] on Friday and is concerned about still having the sinus congestion.   Still feeling very tired. Symptoms: left side of her sinus and face with a lot of congestion. Frequency: Since seen on 1/20.   See notes. Pertinent Negatives: Patient denies fever Disposition: [] ED /[] Urgent Care (no appt availability in office) / [] Appointment(In office/virtual)/ []  [facility]/ [] Home Care/ [] Refused Recommended Disposition /[] [facility]/ [x]  Follow-up with PCP Additional Notes: Message sent to Dr. Beryle Flock.    Pt agreeable to someone calling her back with provider's recommendations especially since she will be flying on Friday.    Ok to leave a message on her voicemail.

## 2023-06-18 NOTE — Progress Notes (Signed)
  Because Ms. Meachum, I feel your condition warrants further evaluation and I recommend that you be seen in a face-to-face visit.   NOTE: There will be NO CHARGE for this E-Visit   If you are having a true medical emergency, please call 911.     For an urgent face to face visit, [facility] has multiple urgent care centers for your convenience.  Click the link below for the full list of locations and hours, walk-in wait times, appointment scheduling options and driving directions:  Urgent Care - [place], [place], [place], [place], [place], [place]       Your MyChart E-visit questionnaire answers were reviewed by a board certified advanced clinical practitioner to complete your personal care plan based on your specific symptoms.    Thank you for using e-Visits.

## 2023-06-19 ENCOUNTER — Telehealth: Payer: Self-pay

## 2023-06-19 MED ORDER — DOXYCYCLINE HYCLATE 100 MG PO TABS: ORAL_TABLET | ORAL | 0 refills | Status: DC

## 2023-06-19 NOTE — Telephone Encounter (Signed)
Failed augmentin. Ok to prescribe Doxycycline 100mg  BID x7 d #14 r0

## 2023-06-19 NOTE — Telephone Encounter (Signed)
Copied from CRM [id number]. Topic: General - Other >> Jun 18, 2023  5:00 PM Carla L wrote: Reason for CRM: Pt following up on NT message from today. Pt requesting something be sent in as soon as possible and requesting call back.

## 2023-06-19 NOTE — Telephone Encounter (Signed)
Rx placed Pt advised. Verbalized understanding

## 2023-06-19 NOTE — Telephone Encounter (Signed)
Please see other telephone encounter.

## 2023-06-20 MED ORDER — PREDNISONE 20 MG PO TABS: 40.0000 mg | ORAL_TABLET | Freq: Every day | ORAL | 0 refills | Status: AC

## 2023-06-20 NOTE — Telephone Encounter (Signed)
Ok to eRx prednisone 20mg  tabs - take 2 PO qAM with breakfast x5 days #10 r0

## 2023-07-14 ENCOUNTER — Encounter: Payer: Self-pay | Admitting: Physician Assistant

## 2023-07-31 ENCOUNTER — Telehealth: Payer: Self-pay

## 2023-07-31 DIAGNOSIS — G4730 Sleep apnea, unspecified: Secondary | ICD-10-CM

## 2023-07-31 NOTE — Telephone Encounter (Signed)
 Copied from CRM [id number]. Topic: General - Other >> Jul 31, 2023 11:33 AM Eleonore Chiquito wrote: Reason for CRM: Pt would like for someone to call with results from sleep study

## 2023-08-07 ENCOUNTER — Ambulatory Visit
Admission: RE | Admit: 2023-08-07 | Discharge: 2023-08-07 | Disposition: A | Discharge status: Home / Self Care | Payer: Self-pay | Source: Ambulatory Visit | Attending: Family Medicine | Admitting: Family Medicine

## 2023-08-07 DIAGNOSIS — Z1231 Encounter for screening mammogram for malignant neoplasm of breast: Secondary | ICD-10-CM | POA: Insufficient documentation

## 2023-08-13 ENCOUNTER — Other Ambulatory Visit: Payer: Self-pay | Admitting: Family Medicine

## 2023-08-13 DIAGNOSIS — I10 Essential (primary) hypertension: Secondary | ICD-10-CM

## 2023-08-14 ENCOUNTER — Encounter: Payer: Self-pay | Admitting: Family Medicine

## 2023-08-14 NOTE — Telephone Encounter (Signed)
 LMTCB-ok for E2C2 to advise.

## 2023-08-14 NOTE — Telephone Encounter (Signed)
 Sleep study shows AHI of 9.7, which means that every hour on average she stopped breathing or dropped her oxygen levels 9.7 times.  This corresponds to mild sleep apnea. Recommend autopap with mask of patient's choice and heated humidity and all necessary tubing and filters - please place orders.

## 2023-08-14 NOTE — Addendum Note (Signed)
 Addended by: Marjie Skiff on: 08/14/2023 02:33 PM   Modules accepted: Orders

## 2023-08-14 NOTE — Telephone Encounter (Signed)
 Order placed. Will try to call patient later about the results.

## 2023-08-15 NOTE — Telephone Encounter (Unsigned)
 Copied from CRM [id number]. Topic: Clinical - Medical Advice >> Aug 15, 2023 11:11 AM Gildardo Pounds wrote: Reason for CRM: Patient said the sleep study was not covered by insurance. Please do not order anything for the patient until she checks with her insurance company in response to message from Dr Beryle Flock on 08/14/2023. 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

## 2023-08-15 NOTE — Telephone Encounter (Signed)
 FYI

## 2023-08-21 NOTE — Telephone Encounter (Signed)
 Called and spoke to patient about sleep study results. Verbalized understanding. Pt also stated that she has spoken to someone last Thursday and specifically verbalized to not order anything due to the fact her last sleep study was not covered by insurance and she is still paying it off. Pt also stated she has an appointment with ENT tomorrow.

## 2023-08-26 ENCOUNTER — Other Ambulatory Visit: Payer: Self-pay

## 2023-08-26 DIAGNOSIS — G4730 Sleep apnea, unspecified: Secondary | ICD-10-CM

## 2023-08-27 ENCOUNTER — Encounter: Payer: Self-pay | Admitting: Physician Assistant

## 2023-08-27 ENCOUNTER — Ambulatory Visit: Payer: 59 | Admitting: Physician Assistant

## 2023-08-27 VITALS — BP 130/80 | HR 90 | Ht 68.0 in | Wt 231.0 lb

## 2023-08-27 DIAGNOSIS — K921 Melena: Secondary | ICD-10-CM | POA: Diagnosis not present

## 2023-08-27 DIAGNOSIS — D509 Iron deficiency anemia, unspecified: Secondary | ICD-10-CM

## 2023-08-27 MED ORDER — NA SULFATE-K SULFATE-MG SULF 17.5-3.13-1.6 GM/177ML PO SOLN: 1.0000 | Freq: Once | ORAL | 0 refills | Status: AC

## 2023-08-27 NOTE — Progress Notes (Signed)
 Chief Complaint: Blood in stools  HPI:    [name] is a  58 y/o female with a past medical history as listed below, known to Dr. Leone Payor, who was referred to me by Erasmo Downer, MD for a complaint of blood in stools.      12/04/2015 colonoscopy was normal.  Repeat recommended 10 years.    06/09/2023 CBC with a normal hemoglobin of 11.3 and otherwise normal, normal CMP.  Iron panel with an iron saturation low at 11, ferritin 28.    Today, the patient describes that she has had iron deficiency for a few years now.  Tells me that previously she was giving blood whenever she could which was every 2 months.  She would take oral iron supplement the week before and the week after giving blood to help make up for this.  Most recently she has also seen some bright red blood in with her stools, it looks like "quite a lot" sometimes, but this is only when she is constipated.  If she is not then it is not an issue at all.  Tells me she only gets constipated if she goes out of town or eats something off her regular regimen which is not that often.  She will sometimes take MiraLAX as needed for this and it solves the problem.    Denies fever, chills or weight loss.  Past Medical History:  Diagnosis Date   Achilles tendonitis, bilateral 10/24/2015   Anemia    Closed fracture of part of upper end of humerus 06/18/2015   Records received from 06/05/15  [facility]   Dr. Myra Rude saw her for this diagnosis  PT for RIGHT arm 4 weeks 1 x a week   Depression    H/O scarlet fever    Haglund's deformity of right heel 12/27/2016   Heart murmur    History of chicken pox    History of genital warts    Hypertension    Plantar fasciitis    Rash due to allergy    Wheat allergy.   Shingles     Past Surgical History:  Procedure Laterality Date   ABDOMINAL HYSTERECTOMY  05/21/2003   CERVIX REMOVAL     FOOT SURGERY Bilateral    2018   KNEE SURGERY Left    2015   VAGINAL DELIVERY     x2     Current Outpatient Medications  Medication Sig Dispense Refill   aspirin 81 MG tablet Take 81 mg by mouth daily.     cetirizine (ZYRTEC) 10 MG tablet Take 10 mg by mouth daily.     doxycycline (VIBRA-TABS) 100 MG tablet Take 1 tablet (100 mg total) by mouth 2 (two) times daily for 7 days. 14 tablet 0   hydrochlorothiazide (HYDRODIURIL) 25 MG tablet Take 1 tablet by mouth daily for blood pressure. 90 tablet 2   lisinopril (ZESTRIL) 10 MG tablet Take 1 tablet by mouth daily for blood pressure. 90 tablet 2   TURMERIC PO Take 1,200 mg by mouth daily.     vitamin D3 (CHOLECALCIFEROL) 25 MCG tablet Take 5,000 Units by mouth daily.     No current facility-administered medications for this visit.    Allergies as of 08/27/2023 - Review Complete 08/27/2023  Allergen Reaction Noted   Wheat Rash 01/07/2017    Family History  Adopted: Yes  Problem Relation Age of Onset   Hyperlipidemia Mother    Hypertension Mother    Stroke Maternal Grandmother  Colon cancer Neg Hx    Esophageal cancer Neg Hx    Rectal cancer Neg Hx    Stomach cancer Neg Hx    Breast cancer Neg Hx     Social History   Socioeconomic History   Marital status: Married    Spouse name: Not on file   Number of children: 2   Years of education: 4   Highest education level: Master's degree (e.g., MA, MS, MEng, MEd, MSW, MBA)  Occupational History   OccupationProduction manager  Tobacco Use   Smoking status: Never   Smokeless tobacco: Never  Vaping Use   Vaping status: Never Used  Substance and Sexual Activity   Alcohol use: Yes    Alcohol/week: 4.0 - 6.0 standard drinks of alcohol    Types: 4 - 6 Glasses of wine per week    Comment: social   Drug use: No   Sexual activity: Yes    Partners: Male    Birth control/protection: Post-menopausal  Other Topics Concern   Not on file  Social History Narrative   Married.   2 children.   She teaches high school.   Enjoys Clinical cytogeneticist.    Social Drivers of  Corporate investment banker Strain: Low Risk  (06/07/2023)   Overall Financial Resource Strain (CARDIA)    Difficulty of Paying Living Expenses: Not hard at all  Food Insecurity: No Food Insecurity (06/07/2023)   Hunger Vital Sign    Worried About Running Out of Food in the Last Year: Never true    Ran Out of Food in the Last Year: Never true  Transportation Needs: No Transportation Needs (06/07/2023)   PRAPARE - Administrator, Civil Service (Medical): No    Lack of Transportation (Non-Medical): No  Physical Activity: Sufficiently Active (06/07/2023)   Exercise Vital Sign    Days of Exercise per Week: 5 days    Minutes of Exercise per Session: 40 min  Stress: No Stress Concern Present (06/07/2023)   [employer] of Occupational Health - Occupational Stress Questionnaire    Feeling of Stress : Not at all  Social Connections: Moderately Isolated (06/07/2023)   Social Connection and Isolation Panel [NHANES]    Frequency of Communication with Friends and Family: More than three times a week    Frequency of Social Gatherings with Friends and Family: More than three times a week    Attends Religious Services: Never    Database administrator or Organizations: No    Attends Engineer, structural: Not on file    Marital Status: Married  Catering manager Violence: Not on file    Review of Systems:    Constitutional: No weight loss, fever or chills Skin: No rash  Cardiovascular: No chest pain   Respiratory: No SOB  Gastrointestinal: See HPI and otherwise negative Genitourinary: No dysuria  Neurological: No headache, dizziness or syncope Musculoskeletal: No new muscle or joint pain Hematologic: No bruising Psychiatric: No history of depression or anxiety   Physical Exam:  Vital signs: BP 130/80   Pulse 90   Ht 5\' 8"  (1.727 m)   Wt 231 lb (104.8 kg)   BMI 35.12 kg/m    Constitutional:   Pleasant overweight Caucasian female appears to be in NAD, Well  developed, Well nourished, alert and cooperative Head:  Normocephalic and atraumatic. Eyes:   PEERL, EOMI. No icterus. Conjunctiva pink. Ears:  Normal auditory acuity. Neck:  Supple Throat: Oral cavity and pharynx without inflammation, swelling  or lesion.  Respiratory: Respirations even and unlabored. Lungs clear to auscultation bilaterally.   No wheezes, crackles, or rhonchi.  Cardiovascular: Normal S1, S2. No MRG. Regular rate and rhythm. No peripheral edema, cyanosis or pallor.  Gastrointestinal:  Soft, nondistended, nontender. No rebound or guarding. Normal bowel sounds. No appreciable masses or hepatomegaly. Rectal:  Not performed.  Msk:  Symmetrical without gross deformities. Without edema, no deformity or joint abnormality.  Neurologic:  Alert and  oriented x4;  grossly normal neurologically.  Skin:   Dry and intact without significant lesions or rashes. Psychiatric: Demonstrates good judgement and reason without abnormal affect or behaviors.  RELEVANT LABS AND IMAGING: CBC    Component Value Date/Time   WBC 7.6 06/09/2023 1144   WBC 8.6 09/07/2021 1239   RBC 4.33 06/09/2023 1144   RBC 4.50 09/07/2021 1239   HGB 11.3 06/09/2023 1144   HCT 35.4 06/09/2023 1144   PLT 370 06/09/2023 1144   MCV 82 06/09/2023 1144   MCH 26.1 (L) 06/09/2023 1144   MCH 26.8 (L) 04/28/2020 1515   MCHC 31.9 06/09/2023 1144   MCHC 32.5 09/07/2021 1239   RDW 13.7 06/09/2023 1144   LYMPHSABS 2.5 06/09/2023 1144   MONOABS 0.7 01/05/2014 1527   EOSABS 0.3 06/09/2023 1144   BASOSABS 0.0 06/09/2023 1144    CMP     Component Value Date/Time   NA 138 06/09/2023 1144   K 4.2 06/09/2023 1144   K 3.6 06/24/2013 1613   CL 97 06/09/2023 1144   CO2 26 06/09/2023 1144   GLUCOSE 93 06/09/2023 1144   GLUCOSE 86 09/07/2021 1239   BUN 11 06/09/2023 1144   CREATININE 0.76 06/09/2023 1144   CREATININE 0.68 04/28/2020 1515   CALCIUM 9.4 06/09/2023 1144   PROT 6.8 06/09/2023 1144   ALBUMIN 4.2 06/09/2023  1144   AST 24 06/09/2023 1144   ALT 25 06/09/2023 1144   ALKPHOS 98 06/09/2023 1144   BILITOT 0.2 06/09/2023 1144    Assessment: 1.  Iron deficiency anemia: For years, she is a blood donor, stopped doing this in January under direction from her PCP; consider blood donations versus GI source versus other 2.  Hematochezia: Bright red blood mixed in with stools only when she is constipated which is only she goes out of town; likely hemorrhoids  Plan: 1.  Scheduled patient for diagnostic EGD and colonoscopy for iron deficiency anemia and hematochezia in the [facility] with Dr. Leone Payor.  Did provide the patient a detailed list of risks for procedures and she agrees to proceed. Patient is appropriate for endoscopic procedure(s) in the ambulatory ([facility]) setting.  2.  Patient advised to continue MiraLAX as needed for constipation 3.  Patient to follow in clinic per recommendations after time of procedures.  Hyacinth Meeker, PA-C [facility] 08/27/2023, 1:43 PM  Cc: Erasmo Downer, MD

## 2023-08-27 NOTE — Patient Instructions (Signed)
 We have sent the following medications to your pharmacy for you to pick up at your convenience: Suprep  You have been scheduled for an endoscopy and colonoscopy. Please follow the written instructions given to you at your visit today.  If you use inhalers (even only as needed), please bring them with you on the day of your procedure.  DO NOT TAKE 7 DAYS PRIOR TO TEST- Trulicity (dulaglutide) Ozempic, Wegovy (semaglutide) Mounjaro (tirzepatide) Bydureon Bcise (exanatide extended release)  DO NOT TAKE 1 DAY PRIOR TO YOUR TEST Rybelsus (semaglutide) Adlyxin (lixisenatide) Victoza (liraglutide) Byetta (exanatide) ___________________________________________________________________________  _______________________________________________________  If your blood pressure at your visit was 140/90 or greater, please contact your primary care physician to follow up on this.  _______________________________________________________  If you are age 82 or older, your body mass index should be between 23-30. Your Body mass index is 35.12 kg/m. If this is out of the aforementioned range listed, please consider follow up with your Primary Care Provider.  If you are age 3 or younger, your body mass index should be between 19-25. Your Body mass index is 35.12 kg/m. If this is out of the aformentioned range listed, please consider follow up with your Primary Care Provider.   ________________________________________________________  The [facility] providers would like to encourage you to use [facility] to communicate with providers for non-urgent requests or questions.  Due to long hold times on the telephone, sending your provider a message by [facility] may be a faster and more efficient way to get a response.  Please allow 48 business hours for a response.  Please remember that this is for non-urgent requests.  _______________________________________________________

## 2023-09-12 ENCOUNTER — Ambulatory Visit: Payer: 59 | Admitting: Gastroenterology

## 2023-10-01 ENCOUNTER — Telehealth: Payer: Self-pay | Admitting: Physician Assistant

## 2023-10-01 ENCOUNTER — Encounter: Payer: Self-pay | Admitting: Internal Medicine

## 2023-10-01 NOTE — Telephone Encounter (Signed)
 Inbound call from patient wanting to confirm that pre authorization was submitted for 5/23 colonoscopy. Also would like to discuss out of pocket cost. Please advise, thank you.

## 2023-10-09 NOTE — Progress Notes (Signed)
 [facility] History and Physical   Primary Care Physician:  Mazie Speed, MD   Reason for Procedure:  Iron deficiency anemia, blood in stool  Plan:    EGD and colonoscopy     HPI: [name] is a 58 y.o. female who is having some small-volume hematochezia, also iron deficiency anemia.  She has been a blood donor.  She was seen in the clinic in April and EGD and colonoscopy were scheduled to evaluate these problems.   Past Medical History:  Diagnosis Date   Achilles tendonitis, bilateral 10/24/2015   Anemia    Closed fracture of part of upper end of humerus 06/18/2015   Records received from 06/05/15  [facility]   Dr. Daris Edman saw her for this diagnosis  PT for RIGHT arm 4 weeks 1 x a week   Depression    H/O scarlet fever    Haglund's deformity of right heel 12/27/2016   Heart murmur    History of chicken pox    History of genital warts    Hypertension    Plantar fasciitis    Rash due to allergy    Wheat allergy.   Shingles     Past Surgical History:  Procedure Laterality Date   ABDOMINAL HYSTERECTOMY  05/21/2003   CERVIX REMOVAL     FOOT SURGERY Bilateral    2018   KNEE SURGERY Left    2015   VAGINAL DELIVERY     x2    Prior to Admission medications   Medication Sig Start Date End Date Taking? Authorizing Provider  aspirin 81 MG tablet Take 81 mg by mouth daily.    [provider]  cetirizine (ZYRTEC) 10 MG tablet Take 10 mg by mouth daily.    [provider]  doxycycline  (VIBRA -TABS) 100 MG tablet Take 1 tablet (100 mg total) by mouth 2 (two) times daily for 7 days. 06/19/23   Bacigalupo, Angela M, MD  hydrochlorothiazide  (HYDRODIURIL ) 25 MG tablet Take 1 tablet by mouth daily for blood pressure. 08/14/23   Mazie Speed, MD  lisinopril  (ZESTRIL ) 10 MG tablet Take 1 tablet by mouth daily for blood pressure. 08/14/23   Bacigalupo, Angela M, MD  TURMERIC PO Take 1,200 mg by mouth daily.    [provider]  vitamin D3 (CHOLECALCIFEROL) 25 MCG tablet Take 5,000 Units by mouth daily.    [provider]    Current Outpatient Medications  Medication Sig Dispense Refill   aspirin 81 MG tablet Take 81 mg by mouth daily.     azelastine (ASTELIN) 0.1 % nasal spray SMARTSIG:1-2 Spray(s) Both Nares Twice Daily     cetirizine (ZYRTEC) 10 MG tablet Take 10 mg by mouth daily.     fluticasone (FLONASE) 50 MCG/ACT nasal spray Place 1 spray into both nostrils 2 (two) times daily.     hydrochlorothiazide  (HYDRODIURIL ) 25 MG tablet Take 1 tablet by mouth daily for blood pressure. 90 tablet 2   lisinopril  (ZESTRIL ) 10 MG tablet Take 1 tablet by mouth daily for blood pressure. 90 tablet 2   TURMERIC PO Take 1,200 mg by mouth daily.     vitamin D3 (CHOLECALCIFEROL) 25 MCG tablet Take 5,000 Units by mouth daily.     Current Facility-Administered Medications  Medication Dose Route Frequency Provider Last Rate Last Admin   0.9 %  sodium chloride  infusion  500 mL Intravenous Once Kenney Peacemaker, MD        Allergies as of 10/10/2023 - Review  Complete 10/10/2023  Allergen Reaction Noted   Wheat Rash 01/07/2017    Family History  Adopted: Yes  Problem Relation Age of Onset   Hyperlipidemia Mother    Hypertension Mother    Stroke Maternal Grandmother    Colon cancer Neg Hx    Esophageal cancer Neg Hx    Liver disease Neg Hx    Stomach cancer Neg Hx     Social History   Socioeconomic History   Marital status: Married    Spouse name: Not on file   Number of children: 2   Years of education: 18   Highest education level: Master's degree (e.g., MA, MS, MEng, MEd, MSW, MBA)  Occupational History   Occupation: Psychologist, forensic  Tobacco Use   Smoking status: Never   Smokeless tobacco: Never  Vaping Use   Vaping status: Never Used  Substance and Sexual Activity   Alcohol use: Yes    Alcohol/week: 4.0 - 6.0 standard drinks of alcohol    Types: 4 - 6 Glasses of wine per week     Comment: social   Drug use: No   Sexual activity: Yes    Partners: Male    Birth control/protection: Post-menopausal  Other Topics Concern   Not on file  Social History Narrative   Married.   2 children.   She teaches high school.   Enjoys Clinical cytogeneticist.    Social Drivers of Corporate investment banker Strain: Low Risk  (06/07/2023)   Overall Financial Resource Strain (CARDIA)    Difficulty of Paying Living Expenses: Not hard at all  Food Insecurity: No Food Insecurity (06/07/2023)   Hunger Vital Sign    Worried About Running Out of Food in the Last Year: Never true    Ran Out of Food in the Last Year: Never true  Transportation Needs: No Transportation Needs (06/07/2023)   PRAPARE - Administrator, Civil Service (Medical): No    Lack of Transportation (Non-Medical): No  Physical Activity: Sufficiently Active (06/07/2023)   Exercise Vital Sign    Days of Exercise per Week: 5 days    Minutes of Exercise per Session: 40 min  Stress: No Stress Concern Present (06/07/2023)   [employer] of Occupational Health - Occupational Stress Questionnaire    Feeling of Stress : Not at all  Social Connections: Moderately Isolated (06/07/2023)   Social Connection and Isolation Panel [NHANES]    Frequency of Communication with Friends and Family: More than three times a week    Frequency of Social Gatherings with Friends and Family: More than three times a week    Attends Religious Services: Never    Database administrator or Organizations: No    Attends Engineer, structural: Not on file    Marital Status: Married  Catering manager Violence: Not on file    Review of Systems:  All other review of systems negative except as mentioned in the HPI.  Physical Exam: Vital signs BP (!) 145/96   Pulse 73   Temp 98.2 F (36.8 C)   Resp 18   Ht 5\' 8"  (1.727 m)   Wt 231 lb (104.8 kg)   SpO2 98%   BMI 35.12 kg/m   General:   Alert,  Well-developed, well-nourished,  pleasant and cooperative in NAD Lungs:  Clear throughout to auscultation.   Heart:  Regular rate and rhythm; no murmurs, clicks, rubs,  or gallops. Abdomen:  Soft, nontender and nondistended. Normal bowel sounds.  Neuro/Psych:  Alert and cooperative. Normal mood and affect. A and O x 3   @Chala Gul  Tammie Fall, MD, [facility] [phone] (pager) 10/10/2023 1:31 PM@

## 2023-10-10 ENCOUNTER — Ambulatory Visit (AMBULATORY_SURGERY_CENTER): Admitting: Internal Medicine

## 2023-10-10 ENCOUNTER — Encounter: Payer: Self-pay | Admitting: Internal Medicine

## 2023-10-10 VITALS — BP 129/89 | HR 71 | Temp 98.2°F | Resp 12 | Ht 68.0 in | Wt 231.0 lb

## 2023-10-10 DIAGNOSIS — D509 Iron deficiency anemia, unspecified: Secondary | ICD-10-CM

## 2023-10-10 DIAGNOSIS — K621 Rectal polyp: Secondary | ICD-10-CM

## 2023-10-10 DIAGNOSIS — K921 Melena: Secondary | ICD-10-CM

## 2023-10-10 DIAGNOSIS — D128 Benign neoplasm of rectum: Secondary | ICD-10-CM

## 2023-10-10 DIAGNOSIS — K648 Other hemorrhoids: Secondary | ICD-10-CM

## 2023-10-10 MED ORDER — SODIUM CHLORIDE 0.9 % IV SOLN: 500.0000 mL | Freq: Once | INTRAVENOUS | Status: DC

## 2023-10-10 NOTE — Op Note (Signed)
 [facility] Patient Name: [name] Procedure Date: 10/10/2023 1:17 PM MRN: [id number] Endoscopist: Kenney Peacemaker , MD, [id number] Age: 58 Referring MD:  Date of Birth: 10-Oct-1965 Gender: Female Account #: [id number] Procedure:                Upper GI endoscopy Indications:              Iron deficiency anemia Medicines:                Monitored Anesthesia Care Procedure:                Pre-Anesthesia Assessment:                           - Prior to the procedure, a History and Physical                            was performed, and patient medications and                            allergies were reviewed. The patient's tolerance of                            previous anesthesia was also reviewed. The risks                            and benefits of the procedure and the sedation                            options and risks were discussed with the patient.                            All questions were answered, and informed consent                            was obtained. Prior Anticoagulants: The patient has                            taken no anticoagulant or antiplatelet agents. ASA                            Grade Assessment: II - A patient with mild systemic                            disease. After reviewing the risks and benefits,                            the patient was deemed in satisfactory condition to                            undergo the procedure.                           After obtaining informed consent, the endoscope was  passed under direct vision. Throughout the                            procedure, the patient's blood pressure, pulse, and                            oxygen saturations were monitored continuously. The                            GIF [id number] #[id number] was introduced through the                            mouth, and advanced to the second part of duodenum.                            The upper GI endoscopy was  accomplished without                            difficulty. The patient tolerated the procedure                            well. Scope In: Scope Out: Findings:                 The esophagus was normal.                           The stomach was normal.                           The examined duodenum was normal.                           The cardia and gastric fundus were normal on                            retroflexion. Complications:            No immediate complications. Estimated Blood Loss:     Estimated blood loss: none. Impression:               - Normal esophagus.                           - Normal stomach.                           - Normal examined duodenum.                           - No specimens collected. Recommendation:           - Patient has a contact number available for                            emergencies. The signs and symptoms of potential  delayed complications were discussed with the                            patient. Return to normal activities tomorrow.                            Written discharge instructions were provided to the                            patient.                           - Resume previous diet.                           - Continue present medications.                           - See the other procedure note for documentation of                            additional recommendations. Kenney Peacemaker, MD 10/10/2023 2:05:33 PM This report has been signed electronically.

## 2023-10-10 NOTE — Progress Notes (Signed)
1310 Robinul 0.1 mg IV given due large amount of secretions upon assessment.  MD made aware, vss

## 2023-10-10 NOTE — Patient Instructions (Addendum)
 The upper endoscopy exam was entirely normal.  The colonoscopy revealed small internal hemorrhoids and 2 tiny polyps in the rectum.  I think the bleeding you have seen came from the hemorrhoids.  I think the iron deficiency was related to blood donation.  You should continue to take your iron supplementation and avoid donating blood.  I will let you know pathology results and when to have another routine colonoscopy by mail and/or My Chart.  I appreciate the opportunity to care for you. Kenney Peacemaker, MD, [facility]  Please see handouts regarding Polyps and Hemorrhoids.  YOU HAD AN ENDOSCOPIC PROCEDURE TODAY AT THE [facility]:   Refer to the procedure report that was given to you for any specific questions about what was found during the examination.  If the procedure report does not answer your questions, please call your gastroenterologist to clarify.  If you requested that your care partner not be given the details of your procedure findings, then the procedure report has been included in a sealed envelope for you to review at your convenience later.  YOU SHOULD EXPECT: Some feelings of bloating in the abdomen. Passage of more gas than usual.  Walking can help get rid of the air that was put into your GI tract during the procedure and reduce the bloating. If you had a lower endoscopy (such as a colonoscopy or flexible sigmoidoscopy) you may notice spotting of blood in your stool or on the toilet paper. If you underwent a bowel prep for your procedure, you may not have a normal bowel movement for a few days.  Please Note:  You might notice some irritation and congestion in your nose or some drainage.  This is from the oxygen used during your procedure.  There is no need for concern and it should clear up in a day or so.  SYMPTOMS TO REPORT IMMEDIATELY:  Following lower endoscopy (colonoscopy or flexible sigmoidoscopy):  Excessive amounts of blood in the stool  Significant  tenderness or worsening of abdominal pains  Swelling of the abdomen that is new, acute  Fever of 100F or higher  Following upper endoscopy (EGD)  Vomiting of blood or coffee ground material  New chest pain or pain under the shoulder blades  Painful or persistently difficult swallowing  New shortness of breath  Fever of 100F or higher  Black, tarry-looking stools  For urgent or emergent issues, a gastroenterologist can be reached at any hour by calling [phone]. Do not use MyChart messaging for urgent concerns.    DIET:  We do recommend a small meal at first, but then you may proceed to your regular diet.  Drink plenty of fluids but you should avoid alcoholic beverages for 24 hours.  ACTIVITY:  You should plan to take it easy for the rest of today and you should NOT DRIVE or use heavy machinery until tomorrow (because of the sedation medicines used during the test).    FOLLOW UP: Our staff will call the number listed on your records the next business day following your procedure.  We will call around 7:15- 8:00 am to check on you and address any questions or concerns that you may have regarding the information given to you following your procedure. If we do not reach you, we will leave a message.     If any biopsies were taken you will be contacted by phone or by letter within the next 1-3 weeks.  Please call us  at [phone] if you  have not heard about the biopsies in 3 weeks.    SIGNATURES/CONFIDENTIALITY: You and/or your care partner have signed paperwork which will be entered into your electronic medical record.  These signatures attest to the fact that that the information above on your After Visit Summary has been reviewed and is understood.  Full responsibility of the confidentiality of this discharge information lies with you and/or your care-partner.

## 2023-10-10 NOTE — Op Note (Signed)
 [facility] Patient Name: Kaleigha Chamberlin Procedure Date: 10/10/2023 1:11 PM MRN: [id number] Endoscopist: Kenney Peacemaker , MD, [id number] Age: 58 Referring MD:  Date of Birth: 06/04/1965 Gender: Female Account #: [id number] Procedure:                Colonoscopy Indications:              Hematochezia, Iron deficiency anemia Medicines:                Monitored Anesthesia Care Procedure:                Pre-Anesthesia Assessment:                           - Prior to the procedure, a History and Physical                            was performed, and patient medications and                            allergies were reviewed. The patient's tolerance of                            previous anesthesia was also reviewed. The risks                            and benefits of the procedure and the sedation                            options and risks were discussed with the patient.                            All questions were answered, and informed consent                            was obtained. Prior Anticoagulants: The patient has                            taken no anticoagulant or antiplatelet agents. ASA                            Grade Assessment: II - A patient with mild systemic                            disease. After reviewing the risks and benefits,                            the patient was deemed in satisfactory condition to                            undergo the procedure.                           After obtaining informed consent, the colonoscope  was passed under direct vision. Throughout the                            procedure, the patient's blood pressure, pulse, and                            oxygen saturations were monitored continuously. The                            Olympus Scope SN [id number] was introduced through the                            anus and advanced to the the cecum, identified by                            appendiceal  orifice and ileocecal valve. The                            colonoscopy was performed without difficulty. The                            patient tolerated the procedure well. The quality                            of the bowel preparation was good. The ileocecal                            valve, appendiceal orifice, and rectum were                            photographed. The bowel preparation used was                            Miralax via split dose instruction. Scope In: 1:42:14 PM Scope Out: 2:00:10 PM Scope Withdrawal Time: 0 hours 15 minutes 43 seconds  Total Procedure Duration: 0 hours 17 minutes 56 seconds  Findings:                 The perianal and digital rectal examinations were                            normal.                           Two sessile polyps were found in the rectum. The                            polyps were 2 to 3 mm in size. These polyps were                            removed with a cold snare. Resection and retrieval                            were complete. Verification of patient  identification for the specimen was done. Estimated                            blood loss was minimal.                           Internal hemorrhoids were found. The hemorrhoids                            were small.                           The exam was otherwise without abnormality on                            direct and retroflexion views. Complications:            No immediate complications. Estimated Blood Loss:     Estimated blood loss was minimal. Impression:               - Two 2 to 3 mm polyps in the rectum, removed with                            a cold snare. Resected and retrieved.                           - Internal hemorrhoids. Cause of bleeding.                           - The examination was otherwise normal on direct                            and retroflexion views. No cause of iron deficiency                            anemia  found. That must be from blood donation. Recommendation:           - Patient has a contact number available for                            emergencies. The signs and symptoms of potential                            delayed complications were discussed with the                            patient. Return to normal activities tomorrow.                            Written discharge instructions were provided to the                            patient.                           - Resume previous diet.                           -  Continue present medications. Avoid donating                            blood at least until iron is fully repleted.                           - Await pathology results.                           - Repeat colonoscopy is recommended. The                            colonoscopy date will be determined after pathology                            results from today's exam become available for                            review. Kenney Peacemaker, MD 10/10/2023 2:09:38 PM This report has been signed electronically.

## 2023-10-10 NOTE — Progress Notes (Signed)
 Called to room to assist during endoscopic procedure.  Patient ID and intended procedure confirmed with present staff. Received instructions for my participation in the procedure from the performing physician.

## 2023-10-10 NOTE — Progress Notes (Signed)
 Report given to PACU, vss

## 2023-10-14 ENCOUNTER — Telehealth: Payer: Self-pay

## 2023-10-14 NOTE — Telephone Encounter (Signed)
  Follow up Call-     10/10/2023   12:50 PM  Call back number  Post procedure Call Back phone  # [phone]  Permission to leave phone message Yes     Patient questions:  Do you have a fever, pain , or abdominal swelling? No. Pain Score  0 *  Have you tolerated food without any problems? Yes.    Have you been able to return to your normal activities? Yes.    Do you have any questions about your discharge instructions: Diet   No. Medications  No. Follow up visit  No.  Do you have questions or concerns about your Care? No.  Actions: * If pain score is 4 or above: No action needed, pain <4.

## 2023-10-16 LAB — SURGICAL PATHOLOGY

## 2023-10-17 ENCOUNTER — Ambulatory Visit: Payer: Self-pay | Admitting: Internal Medicine

## 2023-12-15 ENCOUNTER — Telehealth: Payer: Self-pay

## 2023-12-15 DIAGNOSIS — Z0000 Encounter for general adult medical examination without abnormal findings: Secondary | ICD-10-CM

## 2023-12-15 DIAGNOSIS — I10 Essential (primary) hypertension: Secondary | ICD-10-CM

## 2023-12-15 DIAGNOSIS — E785 Hyperlipidemia, unspecified: Secondary | ICD-10-CM

## 2023-12-15 DIAGNOSIS — R7303 Prediabetes: Secondary | ICD-10-CM

## 2023-12-15 DIAGNOSIS — D509 Iron deficiency anemia, unspecified: Secondary | ICD-10-CM

## 2023-12-15 NOTE — Addendum Note (Signed)
 Addended by: LILIAN SEVERO RAMAN on: 12/15/2023 01:26 PM   Modules accepted: Orders

## 2023-12-15 NOTE — Telephone Encounter (Signed)
 Copied from CRM [id number]. Topic: Clinical - Lab/Test Results >> Dec 15, 2023 10:22 AM Emylou G wrote: Reason for CRM: Patient called... made phys appt in January.. does she need labs to order and schedule

## 2023-12-15 NOTE — Telephone Encounter (Signed)
 Orders placed. Ok to advise per Dr.B if call is returned

## 2023-12-15 NOTE — Telephone Encounter (Signed)
 Patient is due for CBC, iron panel, lipid panel, A1c, CMP for CPE (dx: prediabetes, IDA, HTN, HLD from problem list). Ok to order before her visit. She should have them drawn fasting at least 2 days before our appointment. We will then discuss the results at the upcoming visit.

## 2023-12-15 NOTE — Addendum Note (Signed)
 Addended by: LILIAN SEVERO RAMAN on: 12/15/2023 04:28 PM   Modules accepted: Orders

## 2023-12-22 ENCOUNTER — Ambulatory Visit: Payer: Self-pay | Admitting: Family Medicine

## 2024-01-28 ENCOUNTER — Encounter: Payer: Self-pay | Admitting: Family Medicine

## 2024-01-29 MED ORDER — COVID-19 MRNA VACC (MODERNA) 50 MCG/0.5ML IM SUSY: 0.5000 mL | PREFILLED_SYRINGE | Freq: Once | INTRAMUSCULAR | 0 refills | Status: AC

## 2024-06-14 ENCOUNTER — Encounter: Admitting: Family Medicine

## 2024-06-22 ENCOUNTER — Ambulatory Visit: Payer: Self-pay

## 2024-06-22 ENCOUNTER — Other Ambulatory Visit: Payer: Self-pay

## 2024-06-22 MED ORDER — SULFAMETHOXAZOLE-TRIMETHOPRIM 800-160 MG PO TABS: 1.0000 | ORAL_TABLET | Freq: Two times a day (BID) | ORAL | 0 refills | Status: AC

## 2024-07-26 ENCOUNTER — Encounter: Admitting: Family Medicine
